# Patient Record
Sex: Female | Born: 1935
Health system: Southern US, Community
[De-identification: ages and names within clinical notes are randomized; demographics above are authoritative.]

## PROBLEM LIST (undated history)

## (undated) DIAGNOSIS — R0600 Dyspnea, unspecified: Secondary | ICD-10-CM

## (undated) DIAGNOSIS — Z9889 Other specified postprocedural states: Secondary | ICD-10-CM

## (undated) DIAGNOSIS — C9110 Chronic lymphocytic leukemia of B-cell type not having achieved remission: Secondary | ICD-10-CM

## (undated) DIAGNOSIS — R112 Nausea with vomiting, unspecified: Secondary | ICD-10-CM

## (undated) DIAGNOSIS — K219 Gastro-esophageal reflux disease without esophagitis: Secondary | ICD-10-CM

## (undated) DIAGNOSIS — M8580 Other specified disorders of bone density and structure, unspecified site: Secondary | ICD-10-CM

## (undated) DIAGNOSIS — T4145XA Adverse effect of unspecified anesthetic, initial encounter: Secondary | ICD-10-CM

## (undated) DIAGNOSIS — K5790 Diverticulosis of intestine, part unspecified, without perforation or abscess without bleeding: Secondary | ICD-10-CM

## (undated) DIAGNOSIS — F329 Major depressive disorder, single episode, unspecified: Secondary | ICD-10-CM

## (undated) DIAGNOSIS — T8859XA Other complications of anesthesia, initial encounter: Secondary | ICD-10-CM

## (undated) DIAGNOSIS — F419 Anxiety disorder, unspecified: Secondary | ICD-10-CM

## (undated) DIAGNOSIS — F32A Depression, unspecified: Secondary | ICD-10-CM

## (undated) DIAGNOSIS — R413 Other amnesia: Secondary | ICD-10-CM

## (undated) DIAGNOSIS — G3184 Mild cognitive impairment, so stated: Secondary | ICD-10-CM

## (undated) DIAGNOSIS — IMO0001 Reserved for inherently not codable concepts without codable children: Secondary | ICD-10-CM

## (undated) DIAGNOSIS — R0602 Shortness of breath: Secondary | ICD-10-CM

## (undated) DIAGNOSIS — H9190 Unspecified hearing loss, unspecified ear: Secondary | ICD-10-CM

## (undated) DIAGNOSIS — F418 Other specified anxiety disorders: Secondary | ICD-10-CM

## (undated) DIAGNOSIS — M1990 Unspecified osteoarthritis, unspecified site: Secondary | ICD-10-CM

## (undated) HISTORY — DX: Chronic lymphocytic leukemia of B-cell type not having achieved remission: C91.10

## (undated) HISTORY — DX: Major depressive disorder, single episode, unspecified: F32.9

## (undated) HISTORY — DX: Diverticulosis of intestine, part unspecified, without perforation or abscess without bleeding: K57.90

## (undated) HISTORY — PX: APPENDECTOMY: SHX54

## (undated) HISTORY — DX: Gastro-esophageal reflux disease without esophagitis: K21.9

## (undated) HISTORY — DX: Depression, unspecified: F32.A

## (undated) HISTORY — DX: Anxiety disorder, unspecified: F41.9

## (undated) HISTORY — PX: ABDOMINAL HYSTERECTOMY: SHX81

## (undated) HISTORY — DX: Unspecified hearing loss, unspecified ear: H91.90

## (undated) HISTORY — PX: TONSILLECTOMY: SUR1361

## (undated) HISTORY — DX: Other specified anxiety disorders: F41.8

## (undated) HISTORY — DX: Other amnesia: R41.3

## (undated) HISTORY — DX: Other specified disorders of bone density and structure, unspecified site: M85.80

## (undated) HISTORY — PX: BREAST EXCISIONAL BIOPSY: SUR124

## (undated) HISTORY — DX: Mild cognitive impairment of uncertain or unknown etiology: G31.84

## (undated) HISTORY — DX: Shortness of breath: R06.02

## (undated) HISTORY — PX: CATARACT EXTRACTION: SUR2

## (undated) HISTORY — DX: Reserved for inherently not codable concepts without codable children: IMO0001

## (undated) HISTORY — PX: EYE SURGERY: SHX253

---

## 1998-04-16 ENCOUNTER — Ambulatory Visit (HOSPITAL_COMMUNITY): Admission: RE | Admit: 1998-04-16 | Discharge: 1998-04-16 | Payer: Self-pay | Admitting: Gastroenterology

## 2003-05-10 ENCOUNTER — Encounter: Admission: RE | Admit: 2003-05-10 | Discharge: 2003-05-10 | Payer: Self-pay | Admitting: Oncology

## 2003-05-10 ENCOUNTER — Encounter: Payer: Self-pay | Admitting: Oncology

## 2003-05-24 ENCOUNTER — Ambulatory Visit (HOSPITAL_COMMUNITY): Admission: RE | Admit: 2003-05-24 | Discharge: 2003-05-24 | Payer: Self-pay | Admitting: Gastroenterology

## 2003-08-06 ENCOUNTER — Ambulatory Visit (HOSPITAL_BASED_OUTPATIENT_CLINIC_OR_DEPARTMENT_OTHER): Admission: RE | Admit: 2003-08-06 | Discharge: 2003-08-06 | Payer: Self-pay | Admitting: Urology

## 2003-08-06 ENCOUNTER — Ambulatory Visit (HOSPITAL_COMMUNITY): Admission: RE | Admit: 2003-08-06 | Discharge: 2003-08-06 | Payer: Self-pay | Admitting: Urology

## 2004-05-12 ENCOUNTER — Encounter: Admission: RE | Admit: 2004-05-12 | Discharge: 2004-05-12 | Payer: Self-pay | Admitting: Internal Medicine

## 2005-02-03 HISTORY — PX: OTHER SURGICAL HISTORY: SHX169

## 2005-05-18 ENCOUNTER — Ambulatory Visit (HOSPITAL_COMMUNITY): Admission: RE | Admit: 2005-05-18 | Discharge: 2005-05-18 | Payer: Self-pay | Admitting: Internal Medicine

## 2006-06-01 ENCOUNTER — Ambulatory Visit (HOSPITAL_COMMUNITY): Admission: RE | Admit: 2006-06-01 | Discharge: 2006-06-01 | Payer: Self-pay | Admitting: Internal Medicine

## 2007-06-06 ENCOUNTER — Ambulatory Visit (HOSPITAL_COMMUNITY): Admission: RE | Admit: 2007-06-06 | Discharge: 2007-06-06 | Payer: Self-pay | Admitting: Internal Medicine

## 2007-06-09 ENCOUNTER — Encounter: Admission: RE | Admit: 2007-06-09 | Discharge: 2007-06-09 | Payer: Self-pay | Admitting: Internal Medicine

## 2008-06-11 ENCOUNTER — Ambulatory Visit (HOSPITAL_COMMUNITY): Admission: RE | Admit: 2008-06-11 | Discharge: 2008-06-11 | Payer: Self-pay | Admitting: Internal Medicine

## 2009-06-21 ENCOUNTER — Ambulatory Visit (HOSPITAL_COMMUNITY): Admission: RE | Admit: 2009-06-21 | Discharge: 2009-06-21 | Payer: Self-pay | Admitting: Internal Medicine

## 2010-02-03 ENCOUNTER — Ambulatory Visit: Payer: Self-pay | Admitting: Oncology

## 2010-02-04 LAB — COMPREHENSIVE METABOLIC PANEL
Albumin: 4.7 g/dL (ref 3.5–5.2)
Alkaline Phosphatase: 53 U/L (ref 39–117)
CO2: 30 mEq/L (ref 19–32)
Glucose, Bld: 108 mg/dL — ABNORMAL HIGH (ref 70–99)
Potassium: 4 mEq/L (ref 3.5–5.3)
Sodium: 142 mEq/L (ref 135–145)
Total Protein: 6.6 g/dL (ref 6.0–8.3)

## 2010-02-04 LAB — CBC WITH DIFFERENTIAL/PLATELET
BASO%: 0.2 % (ref 0.0–2.0)
LYMPH%: 58.1 % — ABNORMAL HIGH (ref 14.0–49.7)
MCHC: 34.7 g/dL (ref 31.5–36.0)
MCV: 93.2 fL (ref 79.5–101.0)
MONO%: 4.2 % (ref 0.0–14.0)
Platelets: 272 10*3/uL (ref 145–400)
RBC: 4.59 10*6/uL (ref 3.70–5.45)

## 2010-02-04 LAB — MORPHOLOGY: RBC Comments: NORMAL

## 2010-06-23 ENCOUNTER — Ambulatory Visit (HOSPITAL_COMMUNITY): Admission: RE | Admit: 2010-06-23 | Discharge: 2010-06-23 | Payer: Self-pay | Admitting: Internal Medicine

## 2010-09-21 ENCOUNTER — Encounter: Payer: Self-pay | Admitting: Internal Medicine

## 2010-10-31 HISTORY — PX: TRANSTHORACIC ECHOCARDIOGRAM: SHX275

## 2011-01-16 NOTE — Op Note (Signed)
NAME:  Brenda [name], Brenda [name]                          ACCOUNT NO.:  [id number]   MEDICAL RECORD NO.:  [id number]                   PATIENT TYPE:  AMB   LOCATION:  [facility]                                 FACILITY:  [facility]   PHYSICIAN:  Sigmund I. Patsi Sears, M.D.         DATE OF BIRTH:  1932/06/24   DATE OF PROCEDURE:  08/06/2003  DATE OF DISCHARGE:                                 OPERATIVE REPORT   PREOPERATIVE DIAGNOSIS:  Urinary incontinence, type 3.   POSTOPERATIVE DIAGNOSIS:  Urinary incontinence, type 3.   OPERATION:  Insertion of pubovaginal sling, Mentor transobturator tape.   SURGEON:  Sigmund I. Patsi Sears, M.D.   ANESTHESIA:  General LMA.   PREPARATION:  After appropriate preanesthesia the patient is brought to the  operating room and placed on the operating table in the dorsal supine  position, where general LMA anesthesia was introduced.  She was then  replaced in a dorsal lithotomy position and the pubis was prepped with  Betadine solution and draped in the usual fashion.   DESCRIPTION OF PROCEDURE:  Inspection revealed that the patient has  significant vaginal atrophy.  She was status post previous vaginal surgery,  and there was no evidence of cystourethrocele, enterocele, or rectocele.   Marcaine 0.25% plain 6 mL are injected into the periurethral fascia, and a 2  cm incision is made over the urethra.  Subcutaneous tissue was dissected  with the use of sharp and blunt dissection.  A measurement is made 5 cm  lateral to the urethra at the level of the clitoris, and bilateral stab  wounds are made.  Using the Mentor transobturator placement device, the  transobturator tape was placed underneath the obturator bone bilaterally  without difficulty.  Cystoscopy reveals no evidence of bladder disruption.  Visual inspection reveals no evidence of vaginal tissue disruption.   After copious antibiotic irrigation, the wound is closed in two layers with  3-0 Vicryl suture.  The  patient tolerated the procedure well, was awakened  after given 15 mg of IV Toradol, taken to the recovery room in good  condition.                                               Sigmund I. Patsi Sears, M.D.    SIT/MEDQ  D:  08/06/2003  T:  08/06/2003  Job:  [id number]

## 2011-01-16 NOTE — Op Note (Signed)
   NAME:  Brenda [name], Brenda [name]                          ACCOUNT NO.:  [id number]   MEDICAL RECORD NO.:  [id number]                   PATIENT TYPE:  AMB   LOCATION:  ENDO                                 FACILITY:  [facility] [place]   PHYSICIAN:  Petra Kuba, M.D.                 DATE OF BIRTH:  [birth year]   DATE OF PROCEDURE:  05/24/2003  DATE OF DISCHARGE:                                 OPERATIVE REPORT   PROCEDURE:  Colonoscopy.   INDICATIONS FOR PROCEDURE:  Family history of colon cancer, due for colonic  screening. Consent was signed after risks, benefits, methods, and options  were thoroughly discussed in the past.   MEDICINES USED:  Fentanyl 75 mcg, Versed 6.   DESCRIPTION OF PROCEDURE:  Rectal inspection was pertinent for small  external hemorrhoids. Digital exam was negative. The pediatric video  adjustable colonoscope was inserted and with abdominal pressure easily  advanced around the colon to the cecum. No obvious abnormality was seen on  insertion. The cecum was identified by the appendiceal orifice and the  ileocecal valve. The prep was fairly adequate, did require a moderate amount  of washing and suctioning for adequate visualization. On slow withdrawal  through the colon, no abnormalities were seen specifically no polyps,  tumors, masses or diverticula. Anal rectal pull through and retroflexion did  confirm some hemorrhoids. The scope was reinserted a short ways up the left  side of the colon, air was suctioned, scope removed. The patient tolerated  the procedure well. There was no obvious or immediate complications.   ENDOSCOPIC DIAGNOSIS:  1. Internal and external hemorrhoids.  2. Otherwise within normal limits to the cecum.    PLAN:  Yearly rectals and guaiacs per Dr. Waynard Edwards or Magrinat. Happy to see  back p.r.n., otherwise, if doing well medically repeat screening in five  years.                                               Petra Kuba, M.D.    MEM/MEDQ  D:   05/24/2003  T:  05/24/2003  Job:  [id number]   cc:   Valentino Hue. Magrinat, M.D.  [address] [facility]  [place]  [place] [address]  Fax: [phone]   Loraine Leriche A. Waynard Edwards, M.D.  [address]  [place]  [place] [address]  Fax: [phone]

## 2011-01-23 HISTORY — PX: NM GATED MYOCARDIAL STUDY (ARMX HX): HXRAD1849

## 2011-02-02 ENCOUNTER — Other Ambulatory Visit: Payer: Self-pay | Admitting: Internal Medicine

## 2011-02-03 ENCOUNTER — Ambulatory Visit
Admission: RE | Admit: 2011-02-03 | Discharge: 2011-02-03 | Disposition: A | Discharge status: Home / Self Care | Payer: Medicare Other | Source: Ambulatory Visit | Attending: Internal Medicine | Admitting: Internal Medicine

## 2011-02-06 ENCOUNTER — Other Ambulatory Visit: Payer: Self-pay | Admitting: Gastroenterology

## 2011-02-11 ENCOUNTER — Ambulatory Visit
Admission: RE | Admit: 2011-02-11 | Discharge: 2011-02-11 | Disposition: A | Discharge status: Home / Self Care | Payer: Medicare Other | Source: Ambulatory Visit | Attending: Gastroenterology | Admitting: Gastroenterology

## 2011-02-11 MED ORDER — IOHEXOL 300 MG/ML  SOLN
100.0000 mL | Freq: Once | INTRAMUSCULAR | Status: AC | PRN
  Administered 2011-02-11: 100 mL via INTRAVENOUS

## 2011-02-20 ENCOUNTER — Other Ambulatory Visit: Payer: Self-pay | Admitting: Neurology

## 2011-02-20 DIAGNOSIS — R413 Other amnesia: Secondary | ICD-10-CM

## 2011-02-24 ENCOUNTER — Ambulatory Visit
Admission: RE | Admit: 2011-02-24 | Discharge: 2011-02-24 | Disposition: A | Discharge status: Home / Self Care | Payer: Medicare Other | Source: Ambulatory Visit | Attending: Neurology | Admitting: Neurology

## 2011-02-24 DIAGNOSIS — R413 Other amnesia: Secondary | ICD-10-CM

## 2011-06-02 ENCOUNTER — Other Ambulatory Visit (HOSPITAL_COMMUNITY): Payer: Self-pay | Admitting: Internal Medicine

## 2011-06-02 DIAGNOSIS — Z1231 Encounter for screening mammogram for malignant neoplasm of breast: Secondary | ICD-10-CM

## 2011-06-26 ENCOUNTER — Ambulatory Visit (HOSPITAL_COMMUNITY)
Admission: RE | Admit: 2011-06-26 | Discharge: 2011-06-26 | Disposition: A | Discharge status: Home / Self Care | Payer: Medicare Other | Source: Ambulatory Visit | Attending: Internal Medicine | Admitting: Internal Medicine

## 2011-06-26 DIAGNOSIS — Z1231 Encounter for screening mammogram for malignant neoplasm of breast: Secondary | ICD-10-CM | POA: Insufficient documentation

## 2011-10-07 DIAGNOSIS — R413 Other amnesia: Secondary | ICD-10-CM | POA: Diagnosis not present

## 2011-10-29 DIAGNOSIS — R0602 Shortness of breath: Secondary | ICD-10-CM | POA: Diagnosis not present

## 2011-12-16 DIAGNOSIS — W458XXA Other foreign body or object entering through skin, initial encounter: Secondary | ICD-10-CM | POA: Diagnosis not present

## 2012-02-01 DIAGNOSIS — L578 Other skin changes due to chronic exposure to nonionizing radiation: Secondary | ICD-10-CM | POA: Diagnosis not present

## 2012-02-01 DIAGNOSIS — Z85828 Personal history of other malignant neoplasm of skin: Secondary | ICD-10-CM | POA: Diagnosis not present

## 2012-02-01 DIAGNOSIS — R413 Other amnesia: Secondary | ICD-10-CM | POA: Diagnosis not present

## 2012-02-01 DIAGNOSIS — L821 Other seborrheic keratosis: Secondary | ICD-10-CM | POA: Diagnosis not present

## 2012-02-01 DIAGNOSIS — B350 Tinea barbae and tinea capitis: Secondary | ICD-10-CM | POA: Diagnosis not present

## 2012-02-18 DIAGNOSIS — Z961 Presence of intraocular lens: Secondary | ICD-10-CM | POA: Diagnosis not present

## 2012-02-18 DIAGNOSIS — H52209 Unspecified astigmatism, unspecified eye: Secondary | ICD-10-CM | POA: Diagnosis not present

## 2012-02-18 DIAGNOSIS — H43819 Vitreous degeneration, unspecified eye: Secondary | ICD-10-CM | POA: Diagnosis not present

## 2012-02-18 DIAGNOSIS — H01009 Unspecified blepharitis unspecified eye, unspecified eyelid: Secondary | ICD-10-CM | POA: Diagnosis not present

## 2012-03-02 DIAGNOSIS — R413 Other amnesia: Secondary | ICD-10-CM | POA: Diagnosis not present

## 2012-04-14 DIAGNOSIS — M949 Disorder of cartilage, unspecified: Secondary | ICD-10-CM | POA: Diagnosis not present

## 2012-04-14 DIAGNOSIS — E785 Hyperlipidemia, unspecified: Secondary | ICD-10-CM | POA: Diagnosis not present

## 2012-04-14 DIAGNOSIS — F329 Major depressive disorder, single episode, unspecified: Secondary | ICD-10-CM | POA: Diagnosis not present

## 2012-04-14 DIAGNOSIS — M899 Disorder of bone, unspecified: Secondary | ICD-10-CM | POA: Diagnosis not present

## 2012-04-14 DIAGNOSIS — R7301 Impaired fasting glucose: Secondary | ICD-10-CM | POA: Diagnosis not present

## 2012-04-21 DIAGNOSIS — R7301 Impaired fasting glucose: Secondary | ICD-10-CM | POA: Diagnosis not present

## 2012-04-21 DIAGNOSIS — Z0000 Encounter for general adult medical examination without abnormal findings: Secondary | ICD-10-CM | POA: Diagnosis not present

## 2012-04-21 DIAGNOSIS — Z1212 Encounter for screening for malignant neoplasm of rectum: Secondary | ICD-10-CM | POA: Diagnosis not present

## 2012-04-21 DIAGNOSIS — F329 Major depressive disorder, single episode, unspecified: Secondary | ICD-10-CM | POA: Diagnosis not present

## 2012-04-21 DIAGNOSIS — C9110 Chronic lymphocytic leukemia of B-cell type not having achieved remission: Secondary | ICD-10-CM | POA: Diagnosis not present

## 2012-04-21 DIAGNOSIS — Z124 Encounter for screening for malignant neoplasm of cervix: Secondary | ICD-10-CM | POA: Diagnosis not present

## 2012-05-09 ENCOUNTER — Other Ambulatory Visit: Payer: Self-pay | Admitting: Internal Medicine

## 2012-05-09 DIAGNOSIS — N644 Mastodynia: Secondary | ICD-10-CM

## 2012-05-10 DIAGNOSIS — M899 Disorder of bone, unspecified: Secondary | ICD-10-CM | POA: Diagnosis not present

## 2012-05-11 ENCOUNTER — Ambulatory Visit
Admission: RE | Admit: 2012-05-11 | Discharge: 2012-05-11 | Disposition: A | Discharge status: Home / Self Care | Payer: Medicare Other | Source: Ambulatory Visit | Attending: Internal Medicine | Admitting: Internal Medicine

## 2012-05-11 DIAGNOSIS — N644 Mastodynia: Secondary | ICD-10-CM | POA: Diagnosis not present

## 2012-05-12 DIAGNOSIS — Z23 Encounter for immunization: Secondary | ICD-10-CM | POA: Diagnosis not present

## 2012-05-25 DIAGNOSIS — Z23 Encounter for immunization: Secondary | ICD-10-CM | POA: Diagnosis not present

## 2012-07-14 ENCOUNTER — Other Ambulatory Visit (HOSPITAL_COMMUNITY): Payer: Self-pay | Admitting: Internal Medicine

## 2012-07-14 DIAGNOSIS — Z1231 Encounter for screening mammogram for malignant neoplasm of breast: Secondary | ICD-10-CM

## 2012-08-04 ENCOUNTER — Ambulatory Visit (HOSPITAL_COMMUNITY)
Admission: RE | Admit: 2012-08-04 | Discharge: 2012-08-04 | Disposition: A | Discharge status: Home / Self Care | Payer: Medicare Other | Source: Ambulatory Visit | Attending: Internal Medicine | Admitting: Internal Medicine

## 2012-08-04 DIAGNOSIS — Z1231 Encounter for screening mammogram for malignant neoplasm of breast: Secondary | ICD-10-CM | POA: Insufficient documentation

## 2012-08-16 DIAGNOSIS — E785 Hyperlipidemia, unspecified: Secondary | ICD-10-CM | POA: Diagnosis not present

## 2012-08-16 DIAGNOSIS — R413 Other amnesia: Secondary | ICD-10-CM | POA: Diagnosis not present

## 2012-08-17 DIAGNOSIS — E785 Hyperlipidemia, unspecified: Secondary | ICD-10-CM | POA: Diagnosis not present

## 2012-08-19 DIAGNOSIS — N952 Postmenopausal atrophic vaginitis: Secondary | ICD-10-CM | POA: Diagnosis not present

## 2012-08-19 DIAGNOSIS — N362 Urethral caruncle: Secondary | ICD-10-CM | POA: Diagnosis not present

## 2012-10-04 DIAGNOSIS — R413 Other amnesia: Secondary | ICD-10-CM | POA: Diagnosis not present

## 2013-01-20 ENCOUNTER — Telehealth: Payer: Self-pay | Admitting: Nurse Practitioner

## 2013-02-01 DIAGNOSIS — D485 Neoplasm of uncertain behavior of skin: Secondary | ICD-10-CM | POA: Diagnosis not present

## 2013-02-01 DIAGNOSIS — L821 Other seborrheic keratosis: Secondary | ICD-10-CM | POA: Diagnosis not present

## 2013-02-01 DIAGNOSIS — Z85828 Personal history of other malignant neoplasm of skin: Secondary | ICD-10-CM | POA: Diagnosis not present

## 2013-02-01 DIAGNOSIS — D0460 Carcinoma in situ of skin of unspecified upper limb, including shoulder: Secondary | ICD-10-CM | POA: Diagnosis not present

## 2013-02-01 DIAGNOSIS — L570 Actinic keratosis: Secondary | ICD-10-CM | POA: Diagnosis not present

## 2013-02-01 DIAGNOSIS — D1739 Benign lipomatous neoplasm of skin and subcutaneous tissue of other sites: Secondary | ICD-10-CM | POA: Diagnosis not present

## 2013-02-02 DIAGNOSIS — L570 Actinic keratosis: Secondary | ICD-10-CM | POA: Diagnosis not present

## 2013-02-14 DIAGNOSIS — E785 Hyperlipidemia, unspecified: Secondary | ICD-10-CM | POA: Diagnosis not present

## 2013-02-20 ENCOUNTER — Other Ambulatory Visit: Payer: Self-pay

## 2013-02-20 DIAGNOSIS — H2640 Unspecified secondary cataract: Secondary | ICD-10-CM | POA: Diagnosis not present

## 2013-02-20 DIAGNOSIS — Z961 Presence of intraocular lens: Secondary | ICD-10-CM | POA: Diagnosis not present

## 2013-02-20 DIAGNOSIS — H524 Presbyopia: Secondary | ICD-10-CM | POA: Diagnosis not present

## 2013-02-20 DIAGNOSIS — H01009 Unspecified blepharitis unspecified eye, unspecified eyelid: Secondary | ICD-10-CM | POA: Diagnosis not present

## 2013-02-20 MED ORDER — CITALOPRAM HYDROBROMIDE 10 MG PO TABS: 10.0000 mg | ORAL_TABLET | Freq: Every day | ORAL | Status: DC

## 2013-05-29 DIAGNOSIS — Z79899 Other long term (current) drug therapy: Secondary | ICD-10-CM | POA: Diagnosis not present

## 2013-05-29 DIAGNOSIS — E785 Hyperlipidemia, unspecified: Secondary | ICD-10-CM | POA: Diagnosis not present

## 2013-05-29 DIAGNOSIS — R7301 Impaired fasting glucose: Secondary | ICD-10-CM | POA: Diagnosis not present

## 2013-05-29 DIAGNOSIS — M899 Disorder of bone, unspecified: Secondary | ICD-10-CM | POA: Diagnosis not present

## 2013-05-30 ENCOUNTER — Encounter: Payer: Self-pay | Admitting: Nurse Practitioner

## 2013-06-02 ENCOUNTER — Encounter: Payer: Self-pay | Admitting: Nurse Practitioner

## 2013-06-02 ENCOUNTER — Ambulatory Visit (INDEPENDENT_AMBULATORY_CARE_PROVIDER_SITE_OTHER): Payer: Medicare Other | Admitting: Nurse Practitioner

## 2013-06-02 VITALS — BP 117/61 | HR 76 | Ht 66.0 in | Wt 163.0 lb

## 2013-06-02 DIAGNOSIS — R413 Other amnesia: Secondary | ICD-10-CM

## 2013-06-02 NOTE — Progress Notes (Signed)
[facility]  PATIENT: Brenda [name] DOB: [birth year]   REASON FOR VISIT: Followup for mild cognitive impairment   HISTORY OF PRESENT ILLNESS:Brenda [name], 79 yr old  right-handed Caucasian female,alone at today's visit returns for followup. She was last seen 10/04/2012. She continues to have a lot of anxiety and stress, currently redoing part of her house which has been time-consuming and sometimes she is unable to make quick decisions. She remains on Celexa but she also worries about other drugs that are ordered for her that the side effects are memory loss. She continues to walk daily and goes to an exercise class III times a week. She is active socially and does a lot of reading.    HISTORY: She is referred by her primary care physician for evaluation of short-term memory trouble. She was initially evaluated by Dr. Terrace [name] 02/12/11. It has been noticeable over the past 4 years, most of by herself, not by her family members. She noticed she has to concentrate to remember details, she tends to forget people's names difficult to come up with words sometimes, but she noticed similar problem in her husband and in her friends.  She tends to read a lot, history books, she has no difficulty keeping up with her reading, she is also socially active, she denied trouble handling her finances, or driving. 08/06/11- Normal MRI of the brain and normal dementia labs. Her father had Alzheimers disease. She has a hx of depression. She is independent with ADL's and driving. She lives at [facility] some of the time. No other changes.   10/04/12: Patient returns for followup. Neuropsych testing by Dr. Leonides [name] after last visit with Dr. Terrace [name] 02/01/12. His dx was probable mild cognitive impairment -amnestic type as a cause of her memory retrieval. He recommends repeat testing in July 2014. She restarted low dose Celexa in June 2013 after complaints of anxiety and mild depression. She says her memory is the same.  She continues to drive without difficulty. She is independent in all ADL's.   REVIEW OF SYSTEMS: Full 14 system review of systems performed and notable only for:  Constitutional: N/A  Cardiovascular: N/A  Ear/Nose/Throat: Hearing loss Skin: N/A  Eyes: N/A  Respiratory: N/A  Gastroitestinal: N/A  Hematology/Lymphatic: N/A  Endocrine: N/A Musculoskeletal:N/A  Allergy/Immunology: N/A  Neurological: Memory loss  Psychiatric anxiety   ALLERGIES: Allergies  Allergen Reactions  . Adhesive [Tape]   . Codeine   . Sulfa Antibiotics     HOME MEDICATIONS: Outpatient Prescriptions Prior to Visit  Medication Sig Dispense Refill  . atorvastatin (LIPITOR) 10 MG tablet Take 10 mg by mouth daily.      . Calcium Carbonate-Vitamin D (CALCIUM + D PO) Take 10 mg by mouth daily.      . citalopram (CELEXA) 10 MG tablet Take 1 tablet (10 mg total) by mouth daily.  90 tablet  2  . LORazepam (ATIVAN) 0.5 MG tablet Take 0.5 mg by mouth every 8 (eight) hours.      . Multiple Vitamin (MULTIVITAMIN) capsule Take 1 capsule by mouth daily.      Marland Kitchen zolpidem (AMBIEN) 5 MG tablet Take 5 mg by mouth daily.      . Estradiol Acetate 0.1 MG/24HR RING Place vaginally as directed.       No facility-administered medications prior to visit.    PAST MEDICAL HISTORY: Past Medical History  Diagnosis Date  . Mild cognitive impairment   . CLL (chronic lymphocytic leukemia)   . Reflux   .  Depression   . Anxiety   . Memory loss     PAST SURGICAL HISTORY: Past Surgical History  Procedure Laterality Date  . Appendectomy      FAMILY HISTORY: Family History  Problem Relation Age of Onset  . Alzheimer's disease Father     SOCIAL HISTORY: History   Social History  . Marital Status: Married    Spouse Name: Greggory Stallion    Number of Children: 2  . Years of Education: college   Occupational History  . Not on file.   Social History Main Topics  . Smoking status: Never Smoker   . Smokeless tobacco: Never  Used  . Alcohol Use: Yes     Comment: wine daily   . Drug Use: No  . Sexual Activity: Not on file   Other Topics Concern  . Not on file   Social History Narrative   Patient lives at home with husband Greggory Stallion.    Patient is retired.    Patient has a college education.    Patient has 2 children.    Patient drinks 2 cups of coffee daily.      PHYSICAL EXAM  Filed Vitals:   06/02/13 0857  BP: 117/61  Pulse: 76  Height: 5\' 6"  (1.676 m)  Weight: 163 lb (73.936 kg)   Body mass index is 26.32 kg/(m^2).  Generalized: Well developed, in no acute distress  Head: normocephalic and atraumatic,. Oropharynx benign  Neck: Supple, no carotid bruits  Cardiac: Regular rate rhythm, no murmur  Musculoskeletal: No deformity   Neurological examination   Mentation: Alert oriented to time, place, history taking. MMSE 30/30. AFT 16. Follows all commands speech and language fluent  Cranial nerve II-XII: Pupils were equal round reactive to light extraocular movements were full, visual field were full on confrontational test. Facial sensation and strength were normal. hearing was intact to finger rubbing bilaterally. Uvula tongue midline. head turning and shoulder shrug and were normal and symmetric.Tongue protrusion into cheek strength was normal. Motor: normal bulk and tone, full strength in the BUE, BLE, fine finger movements normal, no pronator drift. No focal weakness Sensory: normal and symmetric to light touch, pinprick, and  vibration  Coordination: finger-nose-finger, heel-to-shin bilaterally, no dysmetria Reflexes: Brachioradialis 2/2, biceps 2/2, triceps 2/2, patellar 2/2, Achilles 2/2, plantar responses were flexor bilaterally. Gait and Station: Rising up from seated position without assistance, normal stance, without trunk ataxia, moderate stride, good arm swing, smooth turning, able to perform tiptoe, and heel walking without difficulty. Tandem gait normal  DIAGNOSTIC DATA (LABS,  IMAGING, TESTING) None to review   ASSESSMENT AND PLAN  79 y.o. year old female  has a past medical history of Mild cognitive impairment; CLL (chronic lymphocytic leukemia); Reflux; Depression; Anxiety; and Memory loss. here for followup.   Mental status exam 30 out of 30, animal fluency is 16.  Continue moderate exercise for overall general health Patient is not interested in another neuropsych evaluation as suggested by Dr. Leonides [name].  She claims she will talk to her PCP about getting off some meds that have memory loss and cognitive issues as side effects.  Followup in 6-8 months  Nilda Riggs, [facility], [facility], APRN  [facility] [address], [address] [place], [place] [address] [phone]

## 2013-06-02 NOTE — Patient Instructions (Addendum)
Mental status exam 30 out of 30, animal fluency is 16 Continue moderate exercise for overall general health Followup in 6-8 months

## 2013-06-05 DIAGNOSIS — R0602 Shortness of breath: Secondary | ICD-10-CM | POA: Diagnosis not present

## 2013-06-05 DIAGNOSIS — C9110 Chronic lymphocytic leukemia of B-cell type not having achieved remission: Secondary | ICD-10-CM | POA: Diagnosis not present

## 2013-06-05 DIAGNOSIS — M899 Disorder of bone, unspecified: Secondary | ICD-10-CM | POA: Diagnosis not present

## 2013-06-05 DIAGNOSIS — Z124 Encounter for screening for malignant neoplasm of cervix: Secondary | ICD-10-CM | POA: Diagnosis not present

## 2013-06-05 DIAGNOSIS — M25569 Pain in unspecified knee: Secondary | ICD-10-CM | POA: Diagnosis not present

## 2013-06-05 DIAGNOSIS — Z1331 Encounter for screening for depression: Secondary | ICD-10-CM | POA: Diagnosis not present

## 2013-06-05 DIAGNOSIS — Z0000 Encounter for general adult medical examination without abnormal findings: Secondary | ICD-10-CM | POA: Diagnosis not present

## 2013-06-05 DIAGNOSIS — R7301 Impaired fasting glucose: Secondary | ICD-10-CM | POA: Diagnosis not present

## 2013-06-05 DIAGNOSIS — E785 Hyperlipidemia, unspecified: Secondary | ICD-10-CM | POA: Diagnosis not present

## 2013-06-05 DIAGNOSIS — Z1212 Encounter for screening for malignant neoplasm of rectum: Secondary | ICD-10-CM | POA: Diagnosis not present

## 2013-06-05 DIAGNOSIS — R32 Unspecified urinary incontinence: Secondary | ICD-10-CM | POA: Diagnosis not present

## 2013-06-05 DIAGNOSIS — Z23 Encounter for immunization: Secondary | ICD-10-CM | POA: Diagnosis not present

## 2013-06-06 ENCOUNTER — Telehealth: Payer: Self-pay | Admitting: Nurse Practitioner

## 2013-06-07 NOTE — Telephone Encounter (Signed)
Spoke to patient and she said she will call tomorrow if she still feels the need to speak to [place].

## 2013-06-21 DIAGNOSIS — L570 Actinic keratosis: Secondary | ICD-10-CM | POA: Diagnosis not present

## 2013-06-21 DIAGNOSIS — Z23 Encounter for immunization: Secondary | ICD-10-CM | POA: Diagnosis not present

## 2013-07-18 ENCOUNTER — Other Ambulatory Visit (HOSPITAL_COMMUNITY): Payer: Self-pay | Admitting: Internal Medicine

## 2013-07-18 DIAGNOSIS — Z1231 Encounter for screening mammogram for malignant neoplasm of breast: Secondary | ICD-10-CM

## 2013-08-02 DIAGNOSIS — D485 Neoplasm of uncertain behavior of skin: Secondary | ICD-10-CM | POA: Diagnosis not present

## 2013-08-02 DIAGNOSIS — L821 Other seborrheic keratosis: Secondary | ICD-10-CM | POA: Diagnosis not present

## 2013-08-02 DIAGNOSIS — Z85828 Personal history of other malignant neoplasm of skin: Secondary | ICD-10-CM | POA: Diagnosis not present

## 2013-08-02 DIAGNOSIS — L570 Actinic keratosis: Secondary | ICD-10-CM | POA: Diagnosis not present

## 2013-08-08 ENCOUNTER — Ambulatory Visit (HOSPITAL_COMMUNITY)
Admission: RE | Admit: 2013-08-08 | Discharge: 2013-08-08 | Disposition: A | Discharge status: Home / Self Care | Payer: Medicare Other | Source: Ambulatory Visit | Attending: Internal Medicine | Admitting: Internal Medicine

## 2013-08-08 DIAGNOSIS — Z1231 Encounter for screening mammogram for malignant neoplasm of breast: Secondary | ICD-10-CM | POA: Diagnosis not present

## 2013-08-15 DIAGNOSIS — N952 Postmenopausal atrophic vaginitis: Secondary | ICD-10-CM | POA: Diagnosis not present

## 2013-08-15 DIAGNOSIS — R32 Unspecified urinary incontinence: Secondary | ICD-10-CM | POA: Diagnosis not present

## 2013-08-19 ENCOUNTER — Ambulatory Visit (INDEPENDENT_AMBULATORY_CARE_PROVIDER_SITE_OTHER): Payer: Medicare Other | Admitting: Internal Medicine

## 2013-08-19 VITALS — BP 120/62 | HR 85 | Temp 99.2°F | Resp 16 | Ht 66.0 in | Wt 166.0 lb

## 2013-08-19 DIAGNOSIS — R05 Cough: Secondary | ICD-10-CM

## 2013-08-19 DIAGNOSIS — R509 Fever, unspecified: Secondary | ICD-10-CM

## 2013-08-19 LAB — POCT INFLUENZA A/B
Influenza A, POC: NEGATIVE
Influenza B, POC: NEGATIVE

## 2013-08-19 LAB — POCT CBC
Hemoglobin: 13.7 g/dL (ref 12.2–16.2)
Lymph, poc: 4.5 — AB (ref 0.6–3.4)
MCH, POC: 31 pg (ref 27–31.2)
MCHC: 31.2 g/dL — AB (ref 31.8–35.4)
MID (cbc): 0.7 (ref 0–0.9)
MPV: 8.3 fL (ref 0–99.8)
POC Granulocyte: 6.5 (ref 2–6.9)
POC MID %: 6.1 %M (ref 0–12)
Platelet Count, POC: 225 10*3/uL (ref 142–424)
RDW, POC: 14.5 %
WBC: 11.7 10*3/uL — AB (ref 4.6–10.2)

## 2013-08-19 NOTE — Patient Instructions (Signed)
Acute Bronchitis Bronchitis is inflammation of the airways that extend from the windpipe into the lungs (bronchi). The inflammation often causes mucus to develop. This leads to a cough, which is the most common symptom of bronchitis.  In acute bronchitis, the condition usually develops suddenly and goes away over time, usually in a couple weeks. Smoking, allergies, and asthma can make bronchitis worse. Repeated episodes of bronchitis may cause further lung problems.  CAUSES Acute bronchitis is most often caused by the same virus that causes a cold. The virus can spread from person to person (contagious).  SIGNS AND SYMPTOMS   Cough.   Fever.   Coughing up mucus.   Body aches.   Chest congestion.   Chills.   Shortness of breath.   Sore throat.  DIAGNOSIS  Acute bronchitis is usually diagnosed through a physical exam. Tests, such as chest X-rays, are sometimes done to rule out other conditions.  TREATMENT  Acute bronchitis usually goes away in a couple weeks. Often times, no medical treatment is necessary. Medicines are sometimes given for relief of fever or cough. Antibiotics are usually not needed but may be prescribed in certain situations. In some cases, an inhaler may be recommended to help reduce shortness of breath and control the cough. A cool mist vaporizer may also be used to help thin bronchial secretions and make it easier to clear the chest.  HOME CARE INSTRUCTIONS  Get plenty of rest.   Drink enough fluids to keep your urine clear or pale yellow (unless you have a medical condition that requires fluid restriction). Increasing fluids may help thin your secretions and will prevent dehydration.   Only take over-the-counter or prescription medicines as directed by your health care provider.   Avoid smoking and secondhand smoke. Exposure to cigarette smoke or irritating chemicals will make bronchitis worse. If you are a smoker, consider using nicotine gum or skin  patches to help control withdrawal symptoms. Quitting smoking will help your lungs heal faster.   Reduce the chances of another bout of acute bronchitis by washing your hands frequently, avoiding people with cold symptoms, and trying not to touch your hands to your mouth, nose, or eyes.   Follow up with your health care provider as directed.  SEEK MEDICAL CARE IF: Your symptoms do not improve after 1 week of treatment.  SEEK IMMEDIATE MEDICAL CARE IF:  You develop an increased fever or chills.   You have chest pain.   You have severe shortness of breath.  You have bloody sputum.   You develop dehydration.  You develop fainting.  You develop repeated vomiting.  You develop a severe headache. MAKE SURE YOU:   Understand these instructions.  Will watch your condition.  Will get help right away if you are not doing well or get worse. Document Released: 09/24/2004 Document Revised: 04/19/2013 Document Reviewed: 02/07/2013 Exit[employer] Patient Information 2014 [employer].  

## 2013-08-19 NOTE — Progress Notes (Signed)
   Subjective:    Patient ID: Brenda [name], female    DOB: 1932-02-12, 79 y.o.   MRN: [id number]  HPI 5d of cough, now fever to 100.4, chest congestion. No sob or cp. No hemoptysis.   Review of Systems CLL no progression    Objective:   Physical Exam  Vitals reviewed. Constitutional: She is oriented to person, place, and time. She appears well-developed and well-nourished. No distress.  HENT:  Head: Normocephalic.  Right Ear: External ear normal.  Left Ear: External ear normal.  Nose: Nose normal.  Mouth/Throat: Oropharynx is clear and moist.  Eyes: Conjunctivae and EOM are normal. Pupils are equal, round, and reactive to light.  Neck: Normal range of motion. Neck supple. No thyromegaly present.  Cardiovascular: Normal rate and normal heart sounds.   Pulmonary/Chest: Effort normal. Not tachypneic. No respiratory distress. She has no decreased breath sounds. She has wheezes. She has rhonchi. She has rales. She exhibits no tenderness.  Musculoskeletal: Normal range of motion.  Lymphadenopathy:    She has no cervical adenopathy.  Neurological: She is alert and oriented to person, place, and time. She exhibits normal muscle tone. Coordination normal.  Psychiatric: She has a normal mood and affect.   Results for orders placed in visit on 08/19/13  POCT CBC      Result Value Range   WBC 11.7 (*) 4.6 - 10.2 K/uL   Lymph, poc 4.5 (*) 0.6 - 3.4   POC LYMPH PERCENT 38.1  10 - 50 %L   MID (cbc) 0.7  0 - 0.9   POC MID % 6.1  0 - 12 %M   POC Granulocyte 6.5  2 - 6.9   Granulocyte percent 55.8  37 - 80 %G   RBC 4.42  4.04 - 5.48 M/uL   Hemoglobin 13.7  12.2 - 16.2 g/dL   HCT, POC 13.0  86.5 - 47.9 %   MCV 99.3 (*) 80 - 97 fL   MCH, POC 31.0  27 - 31.2 pg   MCHC 31.2 (*) 31.8 - 35.4 g/dL   RDW, POC 78.4     Platelet Count, POC 225  142 - 424 K/uL   MPV 8.3  0 - 99.8 fL  POCT INFLUENZA A/B      Result Value Range   Influenza A, POC Negative     Influenza B, POC Negative             Assessment & Plan:  Zpak/Chest care

## 2013-08-30 DIAGNOSIS — R351 Nocturia: Secondary | ICD-10-CM | POA: Diagnosis not present

## 2013-08-30 DIAGNOSIS — N3942 Incontinence without sensory awareness: Secondary | ICD-10-CM | POA: Diagnosis not present

## 2013-08-30 DIAGNOSIS — N3946 Mixed incontinence: Secondary | ICD-10-CM | POA: Diagnosis not present

## 2013-09-28 DIAGNOSIS — R351 Nocturia: Secondary | ICD-10-CM | POA: Diagnosis not present

## 2013-09-28 DIAGNOSIS — R32 Unspecified urinary incontinence: Secondary | ICD-10-CM | POA: Diagnosis not present

## 2013-09-28 DIAGNOSIS — R350 Frequency of micturition: Secondary | ICD-10-CM | POA: Diagnosis not present

## 2013-10-02 DIAGNOSIS — N3946 Mixed incontinence: Secondary | ICD-10-CM | POA: Diagnosis not present

## 2013-10-05 DIAGNOSIS — Z961 Presence of intraocular lens: Secondary | ICD-10-CM | POA: Diagnosis not present

## 2013-10-05 DIAGNOSIS — H01009 Unspecified blepharitis unspecified eye, unspecified eyelid: Secondary | ICD-10-CM | POA: Diagnosis not present

## 2013-10-05 DIAGNOSIS — H579 Unspecified disorder of eye and adnexa: Secondary | ICD-10-CM | POA: Diagnosis not present

## 2013-10-05 DIAGNOSIS — H04129 Dry eye syndrome of unspecified lacrimal gland: Secondary | ICD-10-CM | POA: Diagnosis not present

## 2013-10-09 DIAGNOSIS — M6281 Muscle weakness (generalized): Secondary | ICD-10-CM | POA: Diagnosis not present

## 2013-10-09 DIAGNOSIS — N3942 Incontinence without sensory awareness: Secondary | ICD-10-CM | POA: Diagnosis not present

## 2013-10-09 DIAGNOSIS — N3946 Mixed incontinence: Secondary | ICD-10-CM | POA: Diagnosis not present

## 2013-10-09 DIAGNOSIS — R279 Unspecified lack of coordination: Secondary | ICD-10-CM | POA: Diagnosis not present

## 2013-10-18 DIAGNOSIS — Z800 Family history of malignant neoplasm of digestive organs: Secondary | ICD-10-CM | POA: Diagnosis not present

## 2013-10-18 DIAGNOSIS — Z1211 Encounter for screening for malignant neoplasm of colon: Secondary | ICD-10-CM | POA: Diagnosis not present

## 2013-10-19 DIAGNOSIS — R279 Unspecified lack of coordination: Secondary | ICD-10-CM | POA: Diagnosis not present

## 2013-10-19 DIAGNOSIS — M6281 Muscle weakness (generalized): Secondary | ICD-10-CM | POA: Diagnosis not present

## 2013-10-19 DIAGNOSIS — N8111 Cystocele, midline: Secondary | ICD-10-CM | POA: Diagnosis not present

## 2013-10-19 DIAGNOSIS — N3946 Mixed incontinence: Secondary | ICD-10-CM | POA: Diagnosis not present

## 2013-10-20 NOTE — Telephone Encounter (Signed)
Pt came in for her visit, closing encounter

## 2013-10-23 DIAGNOSIS — R279 Unspecified lack of coordination: Secondary | ICD-10-CM | POA: Diagnosis not present

## 2013-10-23 DIAGNOSIS — N8111 Cystocele, midline: Secondary | ICD-10-CM | POA: Diagnosis not present

## 2013-10-23 DIAGNOSIS — M6281 Muscle weakness (generalized): Secondary | ICD-10-CM | POA: Diagnosis not present

## 2013-10-23 DIAGNOSIS — N3946 Mixed incontinence: Secondary | ICD-10-CM | POA: Diagnosis not present

## 2013-11-14 DIAGNOSIS — R350 Frequency of micturition: Secondary | ICD-10-CM | POA: Diagnosis not present

## 2013-11-14 DIAGNOSIS — N3946 Mixed incontinence: Secondary | ICD-10-CM | POA: Diagnosis not present

## 2013-11-14 DIAGNOSIS — M6281 Muscle weakness (generalized): Secondary | ICD-10-CM | POA: Diagnosis not present

## 2013-11-14 DIAGNOSIS — N952 Postmenopausal atrophic vaginitis: Secondary | ICD-10-CM | POA: Diagnosis not present

## 2013-11-14 DIAGNOSIS — R351 Nocturia: Secondary | ICD-10-CM | POA: Diagnosis not present

## 2013-11-27 DIAGNOSIS — F3289 Other specified depressive episodes: Secondary | ICD-10-CM | POA: Diagnosis not present

## 2013-11-27 DIAGNOSIS — Z6827 Body mass index (BMI) 27.0-27.9, adult: Secondary | ICD-10-CM | POA: Diagnosis not present

## 2013-11-27 DIAGNOSIS — F329 Major depressive disorder, single episode, unspecified: Secondary | ICD-10-CM | POA: Diagnosis not present

## 2013-11-27 DIAGNOSIS — R7301 Impaired fasting glucose: Secondary | ICD-10-CM | POA: Diagnosis not present

## 2013-11-27 DIAGNOSIS — C9110 Chronic lymphocytic leukemia of B-cell type not having achieved remission: Secondary | ICD-10-CM | POA: Diagnosis not present

## 2013-11-27 DIAGNOSIS — K219 Gastro-esophageal reflux disease without esophagitis: Secondary | ICD-10-CM | POA: Diagnosis not present

## 2013-11-27 DIAGNOSIS — R32 Unspecified urinary incontinence: Secondary | ICD-10-CM | POA: Diagnosis not present

## 2013-11-28 ENCOUNTER — Other Ambulatory Visit: Payer: Self-pay

## 2013-11-28 MED ORDER — CITALOPRAM HYDROBROMIDE 10 MG PO TABS: 10.0000 mg | ORAL_TABLET | Freq: Every day | ORAL | Status: DC

## 2014-01-18 DIAGNOSIS — R310 Gross hematuria: Secondary | ICD-10-CM | POA: Diagnosis not present

## 2014-01-18 DIAGNOSIS — N390 Urinary tract infection, site not specified: Secondary | ICD-10-CM | POA: Diagnosis not present

## 2014-01-18 DIAGNOSIS — R300 Dysuria: Secondary | ICD-10-CM | POA: Diagnosis not present

## 2014-01-25 DIAGNOSIS — N390 Urinary tract infection, site not specified: Secondary | ICD-10-CM | POA: Diagnosis not present

## 2014-02-01 ENCOUNTER — Ambulatory Visit: Payer: Medicare Other | Admitting: Nurse Practitioner

## 2014-02-13 DIAGNOSIS — N3946 Mixed incontinence: Secondary | ICD-10-CM | POA: Diagnosis not present

## 2014-02-13 DIAGNOSIS — N3942 Incontinence without sensory awareness: Secondary | ICD-10-CM | POA: Diagnosis not present

## 2014-02-22 DIAGNOSIS — H11449 Conjunctival cysts, unspecified eye: Secondary | ICD-10-CM | POA: Diagnosis not present

## 2014-02-22 DIAGNOSIS — Z961 Presence of intraocular lens: Secondary | ICD-10-CM | POA: Diagnosis not present

## 2014-02-22 DIAGNOSIS — H2640 Unspecified secondary cataract: Secondary | ICD-10-CM | POA: Diagnosis not present

## 2014-02-26 DIAGNOSIS — L820 Inflamed seborrheic keratosis: Secondary | ICD-10-CM | POA: Diagnosis not present

## 2014-02-26 DIAGNOSIS — D045 Carcinoma in situ of skin of trunk: Secondary | ICD-10-CM | POA: Diagnosis not present

## 2014-02-26 DIAGNOSIS — Z85828 Personal history of other malignant neoplasm of skin: Secondary | ICD-10-CM | POA: Diagnosis not present

## 2014-02-26 DIAGNOSIS — C4441 Basal cell carcinoma of skin of scalp and neck: Secondary | ICD-10-CM | POA: Diagnosis not present

## 2014-02-26 DIAGNOSIS — L821 Other seborrheic keratosis: Secondary | ICD-10-CM | POA: Diagnosis not present

## 2014-02-26 DIAGNOSIS — D485 Neoplasm of uncertain behavior of skin: Secondary | ICD-10-CM | POA: Diagnosis not present

## 2014-03-09 ENCOUNTER — Ambulatory Visit (INDEPENDENT_AMBULATORY_CARE_PROVIDER_SITE_OTHER): Payer: Medicare Other | Admitting: Nurse Practitioner

## 2014-03-09 ENCOUNTER — Encounter: Payer: Self-pay | Admitting: Nurse Practitioner

## 2014-03-09 ENCOUNTER — Encounter (INDEPENDENT_AMBULATORY_CARE_PROVIDER_SITE_OTHER): Payer: Self-pay

## 2014-03-09 VITALS — BP 123/62 | HR 72 | Ht 66.0 in | Wt 163.0 lb

## 2014-03-09 DIAGNOSIS — R413 Other amnesia: Secondary | ICD-10-CM | POA: Diagnosis not present

## 2014-03-09 NOTE — Patient Instructions (Signed)
Memory score is stable Continue moderate exercise for overall general health Followup in 6-8 months

## 2014-03-09 NOTE — Progress Notes (Signed)
[facility]  PATIENT: Brenda [name] DOB: 02/25/1932   REASON FOR VISIT: follow up for memory loss   HISTORY OF PRESENT ILLNESS:Brenda [name], 80 yr old right-handed Caucasian female,alone at today's visit returns for followup. She was last seen 06/02/13.  She continues to have a lot of anxiety and stress, currently redoing part of her house which has been time-consuming and sometimes she is unable to make quick decisions. She remains on Celexa. She continues to walk daily and goes to an exercise class 3 times a week. She is active socially and does a lot of reading. She is independent in all activities of daily living. She continues to drive without difficulty. She has not wanted repeat neuropsych testing. Normal MRI of the brain and normal dementia labs. She returns for reevaluation   HISTORY: She is referred by her primary care physician for evaluation of short-term memory trouble. She was initially evaluated by Dr. Krista [name] 02/12/11. It has been noticeable over the past 4 years, most of by herself, not by her family members. She noticed she has to concentrate to remember details, she tends to forget people's names difficult to come up with words sometimes, but she noticed similar problem in her husband and in her friends.  She tends to read a lot, history books, she has no difficulty keeping up with her reading, she is also socially active, she denied trouble handling her finances, or driving. Her father had Alzheimers disease. She has a hx of depression. She is independent with ADL's and driving. She lives at [facility] some of the time. No other changes.  10/04/12: Patient returns for followup. Neuropsych testing by Dr. Valentina [name] after last visit with Dr. Krista [name] 02/01/12. His dx was probable mild cognitive impairment -amnestic type as a cause of her memory retrieval. He recommends repeat testing in July 2014. She restarted low dose Celexa in June 2013 after complaints of anxiety and mild  depression. She says her memory is the same. She continues to drive without difficulty.  REVIEW OF SYSTEMS: Full 14 system review of systems performed and notable only for those listed, all others are neg:  Constitutional: Fatigue  Cardiovascular: N/A  Ear/Nose/Throat: Hearing loss Skin: N/A  Eyes: N/A  Respiratory: Shortness of breath Gastroitestinal: Incontinence of bladder Hematology/Lymphatic: Easy bruising Endocrine: N/A Musculoskeletal:N/A  Allergy/Immunology: N/A  Neurological: Memory loss stable Psychiatric: Depression  Sleep : NA   ALLERGIES: Allergies  Allergen Reactions  . Adhesive [Tape]   . Codeine   . Sulfa Antibiotics     HOME MEDICATIONS: Outpatient Prescriptions Prior to Visit  Medication Sig Dispense Refill  . atorvastatin (LIPITOR) 10 MG tablet Take 10 mg by mouth daily.      . Calcium Carbonate-Vitamin D (CALCIUM + D PO) Take 10 mg by mouth daily.      . citalopram (CELEXA) 10 MG tablet Take 1 tablet (10 mg total) by mouth daily.  90 tablet  1  . Estradiol Acetate (FEMRING) 0.1 MG/24HR RING Place 1 each vaginally.      Marland Kitchen LORazepam (ATIVAN) 0.5 MG tablet Take 0.5 mg by mouth daily.       . Multiple Vitamin (MULTIVITAMIN) capsule Take 1 capsule by mouth daily.      . pantoprazole (PROTONIX) 40 MG tablet       . ZETIA 10 MG tablet       . zolpidem (AMBIEN) 5 MG tablet Take 5 mg by mouth daily.      . chlorhexidine (PERIDEX) 0.12 %  solution       . TOBRADEX ophthalmic ointment        No facility-administered medications prior to visit.    PAST MEDICAL HISTORY: Past Medical History  Diagnosis Date  . Mild cognitive impairment   . CLL (chronic lymphocytic leukemia)   . Reflux   . Depression   . Anxiety   . Memory loss     PAST SURGICAL HISTORY: Past Surgical History  Procedure Laterality Date  . Appendectomy      FAMILY HISTORY: Family History  Problem Relation Age of Onset  . Alzheimer's disease Father     SOCIAL HISTORY: History    Social History  . Marital Status: Married    Spouse Name: [name]    Number of Children: 2  . Years of Education: college   Occupational History  . Not on file.   Social History Main Topics  . Smoking status: Never Smoker   . Smokeless tobacco: Never Used  . Alcohol Use: Yes     Comment: wine daily   . Drug Use: No  . Sexual Activity: Not on file   Other Topics Concern  . Not on file   Social History Narrative   Patient lives at home with husband [name].    Patient is retired.    Patient has a college education.    Patient has 2 children.    Patient drinks 2 cups of coffee daily.      PHYSICAL EXAM  Filed Vitals:   03/09/14 1109  BP: 123/62  Pulse: 72  Height: 5\' 6"  (1.676 m)  Weight: 163 lb (73.936 kg)   Body mass index is 26.32 kg/(m^2). Generalized: Well developed, in no acute distress  Head: normocephalic and atraumatic,. Oropharynx benign  Neck: Supple, no carotid bruits  Cardiac: Regular rate rhythm, no murmur  Musculoskeletal: No deformity  Neurological examination  Mentation: Alert oriented to time, place, history taking. MMSE 29/30. AFT 17.GDS=3 Follows all commands speech and language fluent  Cranial nerve II-XII: Pupils were equal round reactive to light extraocular movements were full, visual field were full on confrontational test. Facial sensation and strength were normal. hearing was intact to finger rubbing bilaterally. Uvula tongue midline. head turning and shoulder shrug and were normal and symmetric.Tongue protrusion into cheek strength was normal.  Motor: normal bulk and tone, full strength in the BUE, BLE, fine finger movements normal, no pronator drift. No focal weakness  Sensory: normal and symmetric to light touch, pinprick, and vibration  Coordination: finger-nose-finger, heel-to-shin bilaterally, no dysmetria  Reflexes: Brachioradialis 2/2, biceps 2/2, triceps 2/2, patellar 2/2, Achilles 2/2, plantar responses were flexor bilaterally.   Gait and Station: Rising up from seated position without assistance, normal stance, without trunk ataxia, moderate stride, good arm swing, smooth turning, able to perform tiptoe, and heel walking without difficulty. Tandem gait normal . No assistive device   DIAGNOSTIC DATA (LABS, IMAGING, TESTING) - I reviewed patient records, labs, notes, testing and imaging myself where available.  Lab Results  Component Value Date   WBC 11.7* 08/19/2013   HGB 13.7 08/19/2013   HCT 43.9 08/19/2013   MCV 99.3* 08/19/2013   PLT 272 02/04/2010     ASSESSMENT AND PLAN  80 y.o. year old female  has a past medical history of Mild cognitive impairment; CLL (chronic lymphocytic leukemia); Reflux; Depression; Anxiety; and Memory loss. here to followup.  Memory score is stable Continue moderate exercise for overall general health Followup in 6-8 months Dennie Bible, GNP, [facility],  APRN  [facility] [address] [place], [place] [address] [phone]

## 2014-03-12 DIAGNOSIS — L820 Inflamed seborrheic keratosis: Secondary | ICD-10-CM | POA: Diagnosis not present

## 2014-03-12 DIAGNOSIS — L0889 Other specified local infections of the skin and subcutaneous tissue: Secondary | ICD-10-CM | POA: Diagnosis not present

## 2014-03-12 DIAGNOSIS — Z85828 Personal history of other malignant neoplasm of skin: Secondary | ICD-10-CM | POA: Diagnosis not present

## 2014-03-22 DIAGNOSIS — H26499 Other secondary cataract, unspecified eye: Secondary | ICD-10-CM | POA: Diagnosis not present

## 2014-03-22 DIAGNOSIS — H2640 Unspecified secondary cataract: Secondary | ICD-10-CM | POA: Diagnosis not present

## 2014-04-23 ENCOUNTER — Encounter (HOSPITAL_COMMUNITY): Payer: Self-pay | Admitting: Emergency Medicine

## 2014-04-23 ENCOUNTER — Emergency Department (HOSPITAL_COMMUNITY): Payer: Medicare Other

## 2014-04-23 ENCOUNTER — Emergency Department (HOSPITAL_COMMUNITY)
Admission: EM | Admit: 2014-04-23 | Discharge: 2014-04-23 | Disposition: A | Discharge status: Home / Self Care | Payer: Medicare Other | Attending: Emergency Medicine | Admitting: Emergency Medicine

## 2014-04-23 DIAGNOSIS — R0602 Shortness of breath: Secondary | ICD-10-CM | POA: Insufficient documentation

## 2014-04-23 DIAGNOSIS — Z87898 Personal history of other specified conditions: Secondary | ICD-10-CM | POA: Insufficient documentation

## 2014-04-23 DIAGNOSIS — F3289 Other specified depressive episodes: Secondary | ICD-10-CM | POA: Insufficient documentation

## 2014-04-23 DIAGNOSIS — Z8669 Personal history of other diseases of the nervous system and sense organs: Secondary | ICD-10-CM | POA: Insufficient documentation

## 2014-04-23 DIAGNOSIS — F329 Major depressive disorder, single episode, unspecified: Secondary | ICD-10-CM | POA: Diagnosis not present

## 2014-04-23 DIAGNOSIS — F411 Generalized anxiety disorder: Secondary | ICD-10-CM | POA: Diagnosis not present

## 2014-04-23 DIAGNOSIS — Z79899 Other long term (current) drug therapy: Secondary | ICD-10-CM | POA: Diagnosis not present

## 2014-04-23 DIAGNOSIS — R9431 Abnormal electrocardiogram [ECG] [EKG]: Secondary | ICD-10-CM | POA: Insufficient documentation

## 2014-04-23 DIAGNOSIS — K219 Gastro-esophageal reflux disease without esophagitis: Secondary | ICD-10-CM | POA: Insufficient documentation

## 2014-04-23 LAB — CBC WITH DIFFERENTIAL/PLATELET
BASOS PCT: 0 % (ref 0–1)
Basophils Absolute: 0 10*3/uL (ref 0.0–0.1)
Eosinophils Absolute: 0.1 10*3/uL (ref 0.0–0.7)
Eosinophils Relative: 1 % (ref 0–5)
HCT: 41.6 % (ref 36.0–46.0)
Hemoglobin: 14.4 g/dL (ref 12.0–15.0)
Lymphocytes Relative: 67 % — ABNORMAL HIGH (ref 12–46)
Lymphs Abs: 8.8 10*3/uL — ABNORMAL HIGH (ref 0.7–4.0)
MCH: 31.2 pg (ref 26.0–34.0)
MCHC: 34.6 g/dL (ref 30.0–36.0)
MCV: 90.2 fL (ref 78.0–100.0)
Monocytes Absolute: 0.5 10*3/uL (ref 0.1–1.0)
Monocytes Relative: 4 % (ref 3–12)
NRBC: 3 /100{WBCs} — AB
Neutro Abs: 3.6 10*3/uL (ref 1.7–7.7)
Neutrophils Relative %: 28 % — ABNORMAL LOW (ref 43–77)
Platelets: 257 10*3/uL (ref 150–400)
RBC: 4.61 MIL/uL (ref 3.87–5.11)
RDW: 13.7 % (ref 11.5–15.5)
WBC: 13 10*3/uL — AB (ref 4.0–10.5)

## 2014-04-23 LAB — PRO B NATRIURETIC PEPTIDE: Pro B Natriuretic peptide (BNP): 79.8 pg/mL (ref 0–450)

## 2014-04-23 LAB — BASIC METABOLIC PANEL
ANION GAP: 15 (ref 5–15)
BUN: 15 mg/dL (ref 6–23)
CALCIUM: 9.7 mg/dL (ref 8.4–10.5)
CO2: 23 meq/L (ref 19–32)
Chloride: 102 mEq/L (ref 96–112)
Creatinine, Ser: 0.75 mg/dL (ref 0.50–1.10)
GFR calc non Af Amer: 78 mL/min — ABNORMAL LOW (ref >90)
Glucose, Bld: 119 mg/dL — ABNORMAL HIGH (ref 70–99)
Potassium: 4.2 mEq/L (ref 3.7–5.3)
SODIUM: 140 meq/L (ref 137–147)

## 2014-04-23 LAB — URINALYSIS, ROUTINE W REFLEX MICROSCOPIC
BILIRUBIN URINE: NEGATIVE
Glucose, UA: NEGATIVE mg/dL
Hgb urine dipstick: NEGATIVE
KETONES UR: NEGATIVE mg/dL
NITRITE: NEGATIVE
PH: 7 (ref 5.0–8.0)
Protein, ur: NEGATIVE mg/dL
SPECIFIC GRAVITY, URINE: 1.017 (ref 1.005–1.030)
Urobilinogen, UA: 0.2 mg/dL (ref 0.0–1.0)

## 2014-04-23 LAB — URINE MICROSCOPIC-ADD ON

## 2014-04-23 LAB — I-STAT TROPONIN, ED: Troponin i, poc: 0 ng/mL (ref 0.00–0.08)

## 2014-04-23 LAB — D-DIMER, QUANTITATIVE: D-Dimer, Quant: 0.46 ug/mL-FEU (ref 0.00–0.48)

## 2014-04-23 NOTE — ED Notes (Signed)
Pt states for several weeks has had shortness of breath when she bends over or reaches up; today bent over and developed shortness of breath and heavy breathing; denies chest pain; states symptoms now x 45 mins; pt hyperventilating while talking

## 2014-04-23 NOTE — ED Provider Notes (Signed)
CSN: [id number]     Arrival date & time 04/23/14  1421 History   First MD Initiated Contact with Patient 04/23/14 1504     Chief Complaint  Patient presents with  . Shortness of Breath   Patient is a 80 y.o. female presenting with shortness of breath.  Shortness of Breath   Patient is a 80 y.o. Female who presents to the ED with intermittent shortness of breath.  Patient states that for that past couple months she has had intermittient episodes of shortness of breath that last for 30 seconds and then go away with rest.  Patient states that these episodes are usually brought on by leaning forward, reaching up, or with exertion.  Patient states that she was going to mention it to her PCP at here usual visit, however today she developed some dyspnea from organizing her house and had shortness of breath that lasted for 45 minutes to an hour.  Patient denies any chest pain, fevers, chills, cough, congestion, wheezing, PND, orthopnea, abdominal pain, diarrhea, constipation, melena, hematochezia, urinary complaints.  Patient does admit to some mild bilateral pitting edema to her legs occasionally.  All other ROS are negative at this time.  Patient does states that she has a history of CLL which is not currently being treated.  Past Medical History  Diagnosis Date  . Mild cognitive impairment   . CLL (chronic lymphocytic leukemia)   . Reflux   . Depression   . Anxiety   . Memory loss    Past Surgical History  Procedure Laterality Date  . Appendectomy     Family History  Problem Relation Age of Onset  . Alzheimer's disease Father    History  Substance Use Topics  . Smoking status: Never Smoker   . Smokeless tobacco: Never Used  . Alcohol Use: Yes     Comment: wine daily    OB History   Grav Para Term Preterm Abortions TAB SAB Ect Mult Living                 Review of Systems  Respiratory: Positive for shortness of breath.    See HPI   Allergies  Adhesive; Codeine; and Sulfa  antibiotics  Home Medications   Prior to Admission medications   Medication Sig Start Date End Date Taking? Authorizing Provider  atorvastatin (LIPITOR) 20 MG tablet Take 20 mg by mouth at bedtime.   Yes Historical Provider, MD  Calcium Carbonate-Vitamin D (CALCIUM + D PO) Take 10 mg by mouth daily.   Yes Historical Provider, MD  cholecalciferol (VITAMIN D) 1000 UNITS tablet Take 1,000 Units by mouth daily.   Yes Historical Provider, MD  citalopram (CELEXA) 10 MG tablet Take 1 tablet (10 mg total) by mouth daily. 11/28/13  Yes Marcial Pacas, MD  estradiol (ESTRING) 2 MG vaginal ring Place 2 mg vaginally every 3 (three) months. follow package directions   Yes Historical Provider, MD  LORazepam (ATIVAN) 0.5 MG tablet Take 0.5 mg by mouth at bedtime.    Yes Historical Provider, MD  Multiple Vitamin (MULTIVITAMIN) capsule Take 1 capsule by mouth daily.   Yes Historical Provider, MD  pantoprazole (PROTONIX) 40 MG tablet Take 40 mg by mouth daily.  05/29/13  Yes Historical Provider, MD  vitamin C (ASCORBIC ACID) 500 MG tablet Take 500 mg by mouth daily.   Yes Historical Provider, MD  ZETIA 10 MG tablet Take 10 mg by mouth at bedtime.  05/19/13  Yes Historical Provider, MD  zolpidem Lorrin Mais)  5 MG tablet Take 5 mg by mouth daily.   Yes Historical Provider, MD   BP 128/49  Pulse 67  Temp(Src) 98.2 F (36.8 C)  Resp 12  SpO2 98% Physical Exam  Nursing note and vitals reviewed. Constitutional: She is oriented to person, place, and time. She appears well-developed and well-nourished. No distress.  HENT:  Head: Normocephalic and atraumatic.  Mouth/Throat: Oropharynx is clear and moist. No oropharyngeal exudate.  Eyes: Conjunctivae and EOM are normal. Pupils are equal, round, and reactive to light. No scleral icterus.  Neck: Normal range of motion. Neck supple. No JVD present. No thyromegaly present.  Cardiovascular: Normal rate, regular rhythm, normal heart sounds and intact distal pulses.  Exam reveals  no gallop and no friction rub.   No murmur heard. No calf tenderness bilaterally.    Pulmonary/Chest: Effort normal and breath sounds normal. No respiratory distress. She has no wheezes. She has no rales. She exhibits no tenderness.  Abdominal: Soft. Bowel sounds are normal. She exhibits no distension and no mass. There is no tenderness. There is no rebound and no guarding.  Musculoskeletal: Normal range of motion.  Lymphadenopathy:    She has no cervical adenopathy.  Neurological: She is alert and oriented to person, place, and time. No cranial nerve deficit. Coordination normal.  Skin: Skin is warm and dry. She is not diaphoretic.  Psychiatric: She has a normal mood and affect. Her behavior is normal. Judgment and thought content normal.    ED Course  Procedures (including critical care time) Labs Review Labs Reviewed  CBC WITH DIFFERENTIAL - Abnormal; Notable for the following:    WBC 13.0 (*)    Neutrophils Relative % 28 (*)    Lymphocytes Relative 67 (*)    nRBC 3 (*)    Lymphs Abs 8.8 (*)    All other components within normal limits  BASIC METABOLIC PANEL - Abnormal; Notable for the following:    Glucose, Bld 119 (*)    GFR calc non Af Amer 78 (*)    All other components within normal limits  URINALYSIS, ROUTINE W REFLEX MICROSCOPIC - Abnormal; Notable for the following:    APPearance CLOUDY (*)    Leukocytes, UA TRACE (*)    All other components within normal limits  URINE CULTURE  D-DIMER, QUANTITATIVE  PRO B NATRIURETIC PEPTIDE  URINE MICROSCOPIC-ADD ON  PATHOLOGIST SMEAR REVIEW  I-STAT TROPOININ, ED    Imaging Review Dg Chest 2 View  04/23/2014   CLINICAL DATA:  Shortness of breath, nonsmoker  EXAM: CHEST  2 VIEW  COMPARISON:  None.  FINDINGS: The heart size and mediastinal contours are within normal limits. Both lungs are clear. The visualized skeletal structures are unremarkable.  IMPRESSION: No active cardiopulmonary disease.   Electronically Signed   By:  Skipper Cliche M.D.   On: 04/23/2014 15:32     EKG Interpretation   Date/Time:  Monday April 23 2014 14:40:09 EDT Ventricular Rate:  71 PR Interval:  183 QRS Duration: 73 QT Interval:  405 QTC Calculation: 440 R Axis:   40 Text Interpretation:  Sinus rhythm Low voltage, precordial leads Baseline  wander in lead(s) II aVF V2 V3 No old tracing to compare Confirmed by  Debby Freiberg [phone]) on 04/23/2014 5:52:13 PM      MDM   Final diagnoses:  Shortness of breath  EKG abnormality   Patient is a 80 y.o. Female who presents to the ED with intermittent episodes of shortness of breath.  Patient has  a very benign physical exam and is non-toxic appearing with stable vitals.  She is satting at 100% on room air.  CBC shows some mild leukocytosis but prior lab review reveals this to be the patient's baseline.  BMP is without acute abnormality at this time.  CXR shows no acute disease process.  Given history of CLL D-dimer was ordered at this time.  D-dimer is negative.  UA reveals trace leukocytes but no clear infection.  Urine has been sent for culture.  Given no symptoms will not order antibiotics at this time. BNP is negative.  Troponin is negative.  EKG shows T wave inversion in V2 with no other abnormalities at this time and no previous EKGs to compare to.  Patient is stable for discharge at this time.  Patient is to follow-up with Dr. Joylene Draft for further evaluation and potential need for a holter monitor.  Patient was also seen by Dr. Colin Rhein and discussed with him.  Dr. Colin Rhein agrees with the above plan.  Patient was told to return to th ED for ACS symptoms and worsening shortness of breath.  Patient agrees with the above plan and all questions were answered.      Cherylann Parr, PA-C 04/23/14 1817

## 2014-04-23 NOTE — Discharge Instructions (Signed)
Shortness of Breath °Shortness of breath means you have trouble breathing. Shortness of breath needs medical care right away. °HOME CARE  °· Do not smoke. °· Avoid being around chemicals or things (paint fumes, dust) that may bother your breathing. °· Rest as needed. Slowly begin your normal activities. °· Only take medicines as told by your doctor. °· Keep all doctor visits as told. °GET HELP RIGHT AWAY IF:  °· Your shortness of breath gets worse. °· You feel lightheaded, pass out (faint), or have a cough that is not helped by medicine. °· You cough up blood. °· You have pain with breathing. °· You have pain in your chest, arms, shoulders, or belly (abdomen). °· You have a fever. °· You cannot walk up stairs or exercise the way you normally do. °· You do not get better in the time expected. °· You have a hard time doing normal activities even with rest. °· You have problems with your medicines. °· You have any new symptoms. °MAKE SURE YOU: °· Understand these instructions. °· Will watch your condition. °· Will get help right away if you are not doing well or get worse. °Document Released: 02/03/2008 Document Revised: 08/22/2013 Document Reviewed: 11/02/2011 °[employer]® Patient Information ©2015 [employer]. This information is not intended to replace advice given to you by your health care provider. Make sure you discuss any questions you have with your health care provider. ° °

## 2014-04-24 LAB — PATHOLOGIST SMEAR REVIEW

## 2014-04-24 LAB — URINE CULTURE: Special Requests: NORMAL

## 2014-04-24 NOTE — ED Provider Notes (Signed)
Medical screening examination/treatment/procedure(s) were conducted as a shared visit with non-physician practitioner(s) and myself.  I personally evaluated the patient during the encounter.   EKG Interpretation   Date/Time:  Monday April 23 2014 14:40:09 EDT Ventricular Rate:  71 PR Interval:  183 QRS Duration: 73 QT Interval:  405 QTC Calculation: 440 R Axis:   40 Text Interpretation:  Sinus rhythm Low voltage, precordial leads Baseline  wander in lead(s) II aVF V2 V3 No old tracing to compare Confirmed by  Debby Freiberg [phone]) on 04/23/2014 5:52:13 PM        I performed an examination on the patient including cardiac, pulmonary, and gi systems which were unremarkable except as documented by PA.  Briefly, patient is a 80 year old female who presented with intermittent episodes of shortness of breath without clear precipitating or exacerbating or alleviating factors. On my exam the patient is currently without symptoms. We checked troponin and d-dimer both of which were unremarkable. Will have her follow up with cardiology, and patient was given standard return precautions for dyspnea, voiced understanding and will followup  Debby Freiberg, MD 04/24/14 [phone]

## 2014-04-25 DIAGNOSIS — C9110 Chronic lymphocytic leukemia of B-cell type not having achieved remission: Secondary | ICD-10-CM | POA: Diagnosis not present

## 2014-04-25 DIAGNOSIS — R0602 Shortness of breath: Secondary | ICD-10-CM | POA: Diagnosis not present

## 2014-04-25 DIAGNOSIS — Z6827 Body mass index (BMI) 27.0-27.9, adult: Secondary | ICD-10-CM | POA: Diagnosis not present

## 2014-05-24 ENCOUNTER — Other Ambulatory Visit: Payer: Self-pay | Admitting: Neurology

## 2014-06-20 DIAGNOSIS — E785 Hyperlipidemia, unspecified: Secondary | ICD-10-CM | POA: Diagnosis not present

## 2014-06-20 DIAGNOSIS — M859 Disorder of bone density and structure, unspecified: Secondary | ICD-10-CM | POA: Diagnosis not present

## 2014-06-20 DIAGNOSIS — M8580 Other specified disorders of bone density and structure, unspecified site: Secondary | ICD-10-CM | POA: Diagnosis not present

## 2014-06-20 DIAGNOSIS — R7301 Impaired fasting glucose: Secondary | ICD-10-CM | POA: Diagnosis not present

## 2014-06-27 DIAGNOSIS — Z23 Encounter for immunization: Secondary | ICD-10-CM | POA: Diagnosis not present

## 2014-06-27 DIAGNOSIS — M8580 Other specified disorders of bone density and structure, unspecified site: Secondary | ICD-10-CM | POA: Diagnosis not present

## 2014-06-27 DIAGNOSIS — Z008 Encounter for other general examination: Secondary | ICD-10-CM | POA: Diagnosis not present

## 2014-06-27 DIAGNOSIS — K219 Gastro-esophageal reflux disease without esophagitis: Secondary | ICD-10-CM | POA: Diagnosis not present

## 2014-06-27 DIAGNOSIS — C9100 Acute lymphoblastic leukemia not having achieved remission: Secondary | ICD-10-CM | POA: Diagnosis not present

## 2014-06-27 DIAGNOSIS — E785 Hyperlipidemia, unspecified: Secondary | ICD-10-CM | POA: Diagnosis not present

## 2014-06-27 DIAGNOSIS — Z1212 Encounter for screening for malignant neoplasm of rectum: Secondary | ICD-10-CM | POA: Diagnosis not present

## 2014-06-27 DIAGNOSIS — R0602 Shortness of breath: Secondary | ICD-10-CM | POA: Diagnosis not present

## 2014-06-27 DIAGNOSIS — Z1389 Encounter for screening for other disorder: Secondary | ICD-10-CM | POA: Diagnosis not present

## 2014-06-27 DIAGNOSIS — R7301 Impaired fasting glucose: Secondary | ICD-10-CM | POA: Diagnosis not present

## 2014-06-27 DIAGNOSIS — F329 Major depressive disorder, single episode, unspecified: Secondary | ICD-10-CM | POA: Diagnosis not present

## 2014-07-18 ENCOUNTER — Encounter: Payer: Self-pay | Admitting: Neurology

## 2014-07-24 ENCOUNTER — Encounter: Payer: Self-pay | Admitting: Neurology

## 2014-09-06 DIAGNOSIS — L821 Other seborrheic keratosis: Secondary | ICD-10-CM | POA: Diagnosis not present

## 2014-09-06 DIAGNOSIS — L570 Actinic keratosis: Secondary | ICD-10-CM | POA: Diagnosis not present

## 2014-09-06 DIAGNOSIS — Z85828 Personal history of other malignant neoplasm of skin: Secondary | ICD-10-CM | POA: Diagnosis not present

## 2014-09-06 DIAGNOSIS — D485 Neoplasm of uncertain behavior of skin: Secondary | ICD-10-CM | POA: Diagnosis not present

## 2014-09-10 ENCOUNTER — Ambulatory Visit: Payer: Medicare Other | Admitting: Neurology

## 2014-09-13 ENCOUNTER — Encounter: Payer: Self-pay | Admitting: Neurology

## 2014-09-13 ENCOUNTER — Ambulatory Visit (INDEPENDENT_AMBULATORY_CARE_PROVIDER_SITE_OTHER): Payer: Medicare Other | Admitting: Neurology

## 2014-09-13 VITALS — BP 136/75 | HR 79 | Ht 66.0 in | Wt 172.0 lb

## 2014-09-13 DIAGNOSIS — R413 Other amnesia: Secondary | ICD-10-CM | POA: Diagnosis not present

## 2014-09-13 MED ORDER — CITALOPRAM HYDROBROMIDE 10 MG PO TABS: 10.0000 mg | ORAL_TABLET | Freq: Every day | ORAL | Status: DC

## 2014-09-13 NOTE — Progress Notes (Signed)
[facility]  PATIENT: Brenda [name] DOB: [birth year]  HISTORY OF PRESENT ILLNESS:Brenda [name], 80  yr old right-handed Caucasian female,alone at today's visit returns for followup. She was last seen 06/02/13.  She continues to have a lot of anxiety and stress, currently redoing part of her house which has been time-consuming and sometimes she is unable to make quick decisions. She remains on Celexa. She continues to walk daily and goes to an exercise class 3 times a week. She is active socially and does a lot of reading. She is independent in all activities of daily living. She continues to drive without difficulty. She has not wanted repeat neuropsych testing. Normal MRI of the brain and normal dementia labs. She returns for reevaluation   HISTORY: She is referred by her primary care physician for evaluation of short-term memory trouble. Initial evaluation June 2012  She began to notice gradual onset memory trouble since 2008, most of by herself, not by her family members. She noticed she has to concentrate to remember details, she tends to forget people's names difficult to come up with words sometimes, but she noticed similar problem in her husband and in her friends.   She tends to read a lot, history books, she has no difficulty keeping up with her reading, she is also socially active, she denied trouble handling her finances, or driving. Her father had Alzheimers disease. She has a hx of depression. She is independent with ADL's and driving. She lives at [employer] [facility] some of the time. No other changes.   Neuropsych testing by Dr. Valentina [name] in  02/01/12 showed probable mild cognitive impairment -amnestic type as a cause of her memory retrieval. He recommends repeat testing in July 2014.   She restarted low dose Celexa in June 2013 after complaints of anxiety and mild depression. She says her memory is the same. She continues to drive without difficulty.   UPDATE Jan 14th 2016: She  exercise regularly, reads well, still drive without difficulties,  REVIEW OF SYSTEMS: Full 14 system review of systems performed and notable only for those listed, all others are neg:  Hearing loss, shortness of breath, incontinence of bladder, bruise easily, memory loss    ALLERGIES: Allergies  Allergen Reactions  . Adhesive [Tape]   . Codeine   . Sulfa Antibiotics     HOME MEDICATIONS: Outpatient Prescriptions Prior to Visit  Medication Sig Dispense Refill  . atorvastatin (LIPITOR) 20 MG tablet Take 20 mg by mouth at bedtime.    . Calcium Carbonate-Vitamin D (CALCIUM + D PO) Take 10 mg by mouth daily.    . cholecalciferol (VITAMIN D) 1000 UNITS tablet Take 1,000 Units by mouth daily.    . citalopram (CELEXA) 10 MG tablet TAKE 1 TABLET ONCE DAILY. 90 tablet 1  . estradiol (ESTRING) 2 MG vaginal ring Place 2 mg vaginally every 3 (three) months. follow package directions    . LORazepam (ATIVAN) 0.5 MG tablet Take 0.5 mg by mouth at bedtime.     . Multiple Vitamin (MULTIVITAMIN) capsule Take 1 capsule by mouth daily.    . pantoprazole (PROTONIX) 40 MG tablet Take 40 mg by mouth daily.     . vitamin C (ASCORBIC ACID) 500 MG tablet Take 500 mg by mouth daily.    Marland Kitchen ZETIA 10 MG tablet Take 10 mg by mouth at bedtime.     Marland Kitchen zolpidem (AMBIEN) 5 MG tablet Take 5 mg by mouth daily.     No facility-administered medications prior  to visit.    PAST MEDICAL HISTORY: Past Medical History  Diagnosis Date  . Mild cognitive impairment   . CLL (chronic lymphocytic leukemia)   . Reflux   . Depression   . Anxiety   . Memory loss     PAST SURGICAL HISTORY: Past Surgical History  Procedure Laterality Date  . Appendectomy    . Abdominal hysterectomy      FAMILY HISTORY: Family History  Problem Relation Age of Onset  . Alzheimer's disease Father     SOCIAL HISTORY: History   Social History  . Marital Status: Married    Spouse Name: [name]    Number of Children: 2  . Years of  Education: college   Occupational History  . Not on file.   Social History Main Topics  . Smoking status: Never Smoker   . Smokeless tobacco: Never Used  . Alcohol Use: Yes     Comment: wine daily   . Drug Use: No  . Sexual Activity: Not on file   Other Topics Concern  . Not on file   Social History Narrative   Patient lives at home with husband [name].    Patient is retired.    Patient has a college education.    Patient has 2 children.    Patient drinks 2 cups of coffee daily.      PHYSICAL EXAM  Filed Vitals:   09/13/14 0933  BP: 136/75  Pulse: 79  Height: 5\' 6"  (1.676 m)  Weight: 172 lb (78.019 kg)   Body mass index is 27.77 kg/(m^2). Generalized: Well developed, in no acute distress  Head: normocephalic and atraumatic,. Oropharynx benign  Neck: Supple, no carotid bruits  Cardiac: Regular rate rhythm, no murmur  Musculoskeletal: No deformity  Neurological examination  Mentation: Alert oriented to time, place, history taking. MMSE 30 /30. AFT 17. Cranial nerve II-XII: Pupils were equal round reactive to light extraocular movements were full, visual field were full on confrontational test. Facial sensation and strength were normal. hearing was intact to finger rubbing bilaterally. Uvula tongue midline. head turning and shoulder shrug and were normal and symmetric.Tongue protrusion into cheek strength was normal.  Motor: normal bulk and tone, full strength in the BUE, BLE, fine finger movements normal, no pronator drift. No focal weakness  Sensory: normal and symmetric to light touch, pinprick, and vibration  Coordination: finger-nose-finger, heel-to-shin bilaterally, no dysmetria  Reflexes: Brachioradialis 2/2, biceps 2/2, triceps 2/2, patellar 2/2, Achilles 2/2, plantar responses were flexor bilaterally.  Gait and Station: Rising up from seated position without assistance, normal stance, without trunk ataxia, moderate stride, good arm swing, smooth turning, able to  perform tiptoe, and heel walking without difficulty. Tandem gait normal . No assistive device   DIAGNOSTIC DATA (LABS, IMAGING, TESTING) - I reviewed patient records, labs, notes, testing and imaging myself where available.  Lab Results  Component Value Date   WBC 13.0* 04/23/2014   HGB 14.4 04/23/2014   HCT 41.6 04/23/2014   MCV 90.2 04/23/2014   PLT 257 04/23/2014     ASSESSMENT AND PLAN  81 y.o. year old female  has a past medical history of Mild cognitive impairment; CLL (chronic lymphocytic leukemia); Reflux; Depression; Anxiety; and Memory loss. here to followup.  Memory score is stable 30 out of 30 Continue moderate exercise Stop Protonix, Decrease Ativan to 0.5 mg as needed every night Refill her Celexa  Return to clinic as needed    Marcial Pacas, M.D. Ph.D.  [facility] [phone]  [place], [place] [address] Phone: [phone] Fax:      [phone]

## 2014-09-27 ENCOUNTER — Other Ambulatory Visit (HOSPITAL_COMMUNITY): Payer: Self-pay | Admitting: Internal Medicine

## 2014-09-27 DIAGNOSIS — Z1231 Encounter for screening mammogram for malignant neoplasm of breast: Secondary | ICD-10-CM

## 2014-09-28 ENCOUNTER — Ambulatory Visit (HOSPITAL_COMMUNITY)
Admission: RE | Admit: 2014-09-28 | Discharge: 2014-09-28 | Disposition: A | Discharge status: Home / Self Care | Payer: Medicare Other | Source: Ambulatory Visit | Attending: Internal Medicine | Admitting: Internal Medicine

## 2014-09-28 DIAGNOSIS — Z1231 Encounter for screening mammogram for malignant neoplasm of breast: Secondary | ICD-10-CM | POA: Diagnosis not present

## 2014-10-26 DIAGNOSIS — M545 Low back pain: Secondary | ICD-10-CM | POA: Diagnosis not present

## 2014-10-26 DIAGNOSIS — M5127 Other intervertebral disc displacement, lumbosacral region: Secondary | ICD-10-CM | POA: Diagnosis not present

## 2014-11-09 DIAGNOSIS — M5137 Other intervertebral disc degeneration, lumbosacral region: Secondary | ICD-10-CM | POA: Diagnosis not present

## 2014-11-16 DIAGNOSIS — M5127 Other intervertebral disc displacement, lumbosacral region: Secondary | ICD-10-CM | POA: Diagnosis not present

## 2014-11-16 DIAGNOSIS — M545 Low back pain: Secondary | ICD-10-CM | POA: Diagnosis not present

## 2014-12-31 DIAGNOSIS — R600 Localized edema: Secondary | ICD-10-CM | POA: Diagnosis not present

## 2014-12-31 DIAGNOSIS — M4806 Spinal stenosis, lumbar region: Secondary | ICD-10-CM | POA: Diagnosis not present

## 2014-12-31 DIAGNOSIS — R197 Diarrhea, unspecified: Secondary | ICD-10-CM | POA: Diagnosis not present

## 2014-12-31 DIAGNOSIS — Z6829 Body mass index (BMI) 29.0-29.9, adult: Secondary | ICD-10-CM | POA: Diagnosis not present

## 2015-01-01 DIAGNOSIS — R600 Localized edema: Secondary | ICD-10-CM | POA: Diagnosis not present

## 2015-01-03 DIAGNOSIS — M5137 Other intervertebral disc degeneration, lumbosacral region: Secondary | ICD-10-CM | POA: Diagnosis not present

## 2015-01-03 DIAGNOSIS — M5127 Other intervertebral disc displacement, lumbosacral region: Secondary | ICD-10-CM | POA: Diagnosis not present

## 2015-02-12 DIAGNOSIS — M5137 Other intervertebral disc degeneration, lumbosacral region: Secondary | ICD-10-CM | POA: Diagnosis not present

## 2015-02-12 DIAGNOSIS — M47816 Spondylosis without myelopathy or radiculopathy, lumbar region: Secondary | ICD-10-CM | POA: Diagnosis not present

## 2015-02-25 DIAGNOSIS — L821 Other seborrheic keratosis: Secondary | ICD-10-CM | POA: Diagnosis not present

## 2015-02-25 DIAGNOSIS — Z85828 Personal history of other malignant neoplasm of skin: Secondary | ICD-10-CM | POA: Diagnosis not present

## 2015-02-25 DIAGNOSIS — D485 Neoplasm of uncertain behavior of skin: Secondary | ICD-10-CM | POA: Diagnosis not present

## 2015-02-25 DIAGNOSIS — L570 Actinic keratosis: Secondary | ICD-10-CM | POA: Diagnosis not present

## 2015-02-28 DIAGNOSIS — M5136 Other intervertebral disc degeneration, lumbar region: Secondary | ICD-10-CM | POA: Diagnosis not present

## 2015-03-06 DIAGNOSIS — M5136 Other intervertebral disc degeneration, lumbar region: Secondary | ICD-10-CM | POA: Diagnosis not present

## 2015-03-12 DIAGNOSIS — M5136 Other intervertebral disc degeneration, lumbar region: Secondary | ICD-10-CM | POA: Diagnosis not present

## 2015-03-13 DIAGNOSIS — H01005 Unspecified blepharitis left lower eyelid: Secondary | ICD-10-CM | POA: Diagnosis not present

## 2015-03-13 DIAGNOSIS — H01002 Unspecified blepharitis right lower eyelid: Secondary | ICD-10-CM | POA: Diagnosis not present

## 2015-03-13 DIAGNOSIS — H01004 Unspecified blepharitis left upper eyelid: Secondary | ICD-10-CM | POA: Diagnosis not present

## 2015-03-13 DIAGNOSIS — H01001 Unspecified blepharitis right upper eyelid: Secondary | ICD-10-CM | POA: Diagnosis not present

## 2015-03-14 DIAGNOSIS — M5136 Other intervertebral disc degeneration, lumbar region: Secondary | ICD-10-CM | POA: Diagnosis not present

## 2015-03-19 DIAGNOSIS — M5136 Other intervertebral disc degeneration, lumbar region: Secondary | ICD-10-CM | POA: Diagnosis not present

## 2015-03-21 DIAGNOSIS — M5136 Other intervertebral disc degeneration, lumbar region: Secondary | ICD-10-CM | POA: Diagnosis not present

## 2015-04-02 DIAGNOSIS — L905 Scar conditions and fibrosis of skin: Secondary | ICD-10-CM | POA: Diagnosis not present

## 2015-04-02 DIAGNOSIS — L570 Actinic keratosis: Secondary | ICD-10-CM | POA: Diagnosis not present

## 2015-04-02 DIAGNOSIS — D485 Neoplasm of uncertain behavior of skin: Secondary | ICD-10-CM | POA: Diagnosis not present

## 2015-04-02 DIAGNOSIS — L821 Other seborrheic keratosis: Secondary | ICD-10-CM | POA: Diagnosis not present

## 2015-04-02 DIAGNOSIS — Z85828 Personal history of other malignant neoplasm of skin: Secondary | ICD-10-CM | POA: Diagnosis not present

## 2015-04-22 DIAGNOSIS — H01004 Unspecified blepharitis left upper eyelid: Secondary | ICD-10-CM | POA: Diagnosis not present

## 2015-04-22 DIAGNOSIS — H43813 Vitreous degeneration, bilateral: Secondary | ICD-10-CM | POA: Diagnosis not present

## 2015-04-22 DIAGNOSIS — H01001 Unspecified blepharitis right upper eyelid: Secondary | ICD-10-CM | POA: Diagnosis not present

## 2015-04-22 DIAGNOSIS — Z961 Presence of intraocular lens: Secondary | ICD-10-CM | POA: Diagnosis not present

## 2015-04-24 DIAGNOSIS — M5137 Other intervertebral disc degeneration, lumbosacral region: Secondary | ICD-10-CM | POA: Diagnosis not present

## 2015-05-07 DIAGNOSIS — M5137 Other intervertebral disc degeneration, lumbosacral region: Secondary | ICD-10-CM | POA: Diagnosis not present

## 2015-05-27 DIAGNOSIS — M5127 Other intervertebral disc displacement, lumbosacral region: Secondary | ICD-10-CM | POA: Diagnosis not present

## 2015-05-27 DIAGNOSIS — M5137 Other intervertebral disc degeneration, lumbosacral region: Secondary | ICD-10-CM | POA: Diagnosis not present

## 2015-06-03 ENCOUNTER — Other Ambulatory Visit: Payer: Self-pay | Admitting: Internal Medicine

## 2015-06-03 DIAGNOSIS — R229 Localized swelling, mass and lump, unspecified: Secondary | ICD-10-CM | POA: Diagnosis not present

## 2015-06-03 DIAGNOSIS — Z23 Encounter for immunization: Secondary | ICD-10-CM | POA: Diagnosis not present

## 2015-06-03 DIAGNOSIS — Z6828 Body mass index (BMI) 28.0-28.9, adult: Secondary | ICD-10-CM | POA: Diagnosis not present

## 2015-06-04 ENCOUNTER — Other Ambulatory Visit: Payer: Self-pay | Admitting: Internal Medicine

## 2015-06-04 DIAGNOSIS — N6320 Unspecified lump in the left breast, unspecified quadrant: Secondary | ICD-10-CM

## 2015-06-12 ENCOUNTER — Ambulatory Visit
Admission: RE | Admit: 2015-06-12 | Discharge: 2015-06-12 | Disposition: A | Discharge status: Home / Self Care | Payer: Medicare Other | Source: Ambulatory Visit | Attending: Internal Medicine | Admitting: Internal Medicine

## 2015-06-12 DIAGNOSIS — N6320 Unspecified lump in the left breast, unspecified quadrant: Secondary | ICD-10-CM

## 2015-06-12 DIAGNOSIS — R928 Other abnormal and inconclusive findings on diagnostic imaging of breast: Secondary | ICD-10-CM | POA: Diagnosis not present

## 2015-06-26 DIAGNOSIS — R7301 Impaired fasting glucose: Secondary | ICD-10-CM | POA: Diagnosis not present

## 2015-06-26 DIAGNOSIS — M859 Disorder of bone density and structure, unspecified: Secondary | ICD-10-CM | POA: Diagnosis not present

## 2015-06-26 DIAGNOSIS — E785 Hyperlipidemia, unspecified: Secondary | ICD-10-CM | POA: Diagnosis not present

## 2015-07-02 DIAGNOSIS — N3946 Mixed incontinence: Secondary | ICD-10-CM | POA: Diagnosis not present

## 2015-07-03 DIAGNOSIS — Z1389 Encounter for screening for other disorder: Secondary | ICD-10-CM | POA: Diagnosis not present

## 2015-07-03 DIAGNOSIS — Z0000 Encounter for general adult medical examination without abnormal findings: Secondary | ICD-10-CM | POA: Diagnosis not present

## 2015-07-03 DIAGNOSIS — Z6828 Body mass index (BMI) 28.0-28.9, adult: Secondary | ICD-10-CM | POA: Diagnosis not present

## 2015-07-03 DIAGNOSIS — Z1231 Encounter for screening mammogram for malignant neoplasm of breast: Secondary | ICD-10-CM | POA: Diagnosis not present

## 2015-07-03 DIAGNOSIS — M859 Disorder of bone density and structure, unspecified: Secondary | ICD-10-CM | POA: Diagnosis not present

## 2015-07-03 DIAGNOSIS — R0600 Dyspnea, unspecified: Secondary | ICD-10-CM | POA: Diagnosis not present

## 2015-07-03 DIAGNOSIS — K219 Gastro-esophageal reflux disease without esophagitis: Secondary | ICD-10-CM | POA: Diagnosis not present

## 2015-07-03 DIAGNOSIS — R413 Other amnesia: Secondary | ICD-10-CM | POA: Diagnosis not present

## 2015-07-03 DIAGNOSIS — R7301 Impaired fasting glucose: Secondary | ICD-10-CM | POA: Diagnosis not present

## 2015-07-03 DIAGNOSIS — M4806 Spinal stenosis, lumbar region: Secondary | ICD-10-CM | POA: Diagnosis not present

## 2015-07-03 DIAGNOSIS — R600 Localized edema: Secondary | ICD-10-CM | POA: Diagnosis not present

## 2015-07-03 DIAGNOSIS — E785 Hyperlipidemia, unspecified: Secondary | ICD-10-CM | POA: Diagnosis not present

## 2015-07-08 DIAGNOSIS — L905 Scar conditions and fibrosis of skin: Secondary | ICD-10-CM | POA: Diagnosis not present

## 2015-07-08 DIAGNOSIS — M21622 Bunionette of left foot: Secondary | ICD-10-CM | POA: Diagnosis not present

## 2015-07-08 DIAGNOSIS — G5762 Lesion of plantar nerve, left lower limb: Secondary | ICD-10-CM | POA: Diagnosis not present

## 2015-07-19 DIAGNOSIS — N390 Urinary tract infection, site not specified: Secondary | ICD-10-CM | POA: Diagnosis not present

## 2015-07-20 ENCOUNTER — Other Ambulatory Visit: Payer: Self-pay | Admitting: Neurology

## 2015-07-22 DIAGNOSIS — G5762 Lesion of plantar nerve, left lower limb: Secondary | ICD-10-CM | POA: Diagnosis not present

## 2015-07-22 DIAGNOSIS — M19072 Primary osteoarthritis, left ankle and foot: Secondary | ICD-10-CM | POA: Diagnosis not present

## 2015-07-22 DIAGNOSIS — M5416 Radiculopathy, lumbar region: Secondary | ICD-10-CM | POA: Diagnosis not present

## 2015-07-22 DIAGNOSIS — G5752 Tarsal tunnel syndrome, left lower limb: Secondary | ICD-10-CM | POA: Diagnosis not present

## 2015-07-22 NOTE — Telephone Encounter (Signed)
error 

## 2015-07-30 DIAGNOSIS — N390 Urinary tract infection, site not specified: Secondary | ICD-10-CM | POA: Diagnosis not present

## 2015-07-31 DIAGNOSIS — N390 Urinary tract infection, site not specified: Secondary | ICD-10-CM | POA: Diagnosis not present

## 2015-08-05 ENCOUNTER — Telehealth: Payer: Self-pay | Admitting: Neurology

## 2015-08-05 NOTE — Telephone Encounter (Addendum)
Scheduled 87yr f/u for 09/16/15. Ok per Dr Krista Blue to f/u w/ NP. Knows to check in 945am. EK 12/5 Scheduled w. Cecille Rubin. Pt verbalized understanding.

## 2015-08-05 NOTE — Telephone Encounter (Signed)
Patient is calling to see if she needs a yearly appointment to have her med renewed.  He last appointment was in January '16.  She states she is having no problems right now. Please call.

## 2015-08-06 ENCOUNTER — Other Ambulatory Visit: Payer: Self-pay

## 2015-08-06 DIAGNOSIS — Z1231 Encounter for screening mammogram for malignant neoplasm of breast: Secondary | ICD-10-CM

## 2015-08-09 DIAGNOSIS — R3915 Urgency of urination: Secondary | ICD-10-CM | POA: Diagnosis not present

## 2015-08-09 DIAGNOSIS — N3946 Mixed incontinence: Secondary | ICD-10-CM | POA: Diagnosis not present

## 2015-08-09 DIAGNOSIS — R300 Dysuria: Secondary | ICD-10-CM | POA: Diagnosis not present

## 2015-08-09 DIAGNOSIS — R3989 Other symptoms and signs involving the genitourinary system: Secondary | ICD-10-CM | POA: Diagnosis not present

## 2015-08-19 DIAGNOSIS — N3946 Mixed incontinence: Secondary | ICD-10-CM | POA: Diagnosis not present

## 2015-08-19 DIAGNOSIS — R300 Dysuria: Secondary | ICD-10-CM | POA: Diagnosis not present

## 2015-08-19 DIAGNOSIS — R3915 Urgency of urination: Secondary | ICD-10-CM | POA: Diagnosis not present

## 2015-08-19 DIAGNOSIS — R350 Frequency of micturition: Secondary | ICD-10-CM | POA: Diagnosis not present

## 2015-08-29 DIAGNOSIS — M47816 Spondylosis without myelopathy or radiculopathy, lumbar region: Secondary | ICD-10-CM | POA: Diagnosis not present

## 2015-08-29 DIAGNOSIS — M5137 Other intervertebral disc degeneration, lumbosacral region: Secondary | ICD-10-CM | POA: Diagnosis not present

## 2015-09-16 ENCOUNTER — Ambulatory Visit (INDEPENDENT_AMBULATORY_CARE_PROVIDER_SITE_OTHER): Payer: Medicare Other | Admitting: Nurse Practitioner

## 2015-09-16 ENCOUNTER — Ambulatory Visit: Payer: Self-pay | Admitting: Nurse Practitioner

## 2015-09-16 ENCOUNTER — Encounter: Payer: Self-pay | Admitting: Nurse Practitioner

## 2015-09-16 VITALS — BP 139/79 | HR 77 | Ht 66.0 in | Wt 175.2 lb

## 2015-09-16 DIAGNOSIS — F411 Generalized anxiety disorder: Secondary | ICD-10-CM | POA: Diagnosis not present

## 2015-09-16 DIAGNOSIS — R413 Other amnesia: Secondary | ICD-10-CM

## 2015-09-16 MED ORDER — CITALOPRAM HYDROBROMIDE 10 MG PO TABS: 10.0000 mg | ORAL_TABLET | Freq: Every day | ORAL | Status: DC

## 2015-09-16 NOTE — Patient Instructions (Signed)
Memory score is stable 30 out of 30 Continue moderate exercise Refill Celexa for 1 year Stay well-hydrated Follow-up in one year

## 2015-09-16 NOTE — Progress Notes (Signed)
I have reviewed and agreed above plan. 

## 2015-09-16 NOTE — Progress Notes (Signed)
[facility]  PATIENT: Brenda [name] DOB: September 27, 1931   REASON FOR VISIT: Follow-up for memory loss, anxiety HISTORY FROM: Patient    HISTORY OF PRESENT ILLNESS:Ms Brenda [name], 82 yr old right-handed Caucasian female,alone at today's visit returns for followup. She was last seen 09/13/14 by Dr. Krista [name]. She continues to have a lot of anxiety and stress, she is unable to make quick decisions. She remains on Celexa. She continues to walk  some, has some back issues and sees Dr. Tonita [name].  She is active socially and does a lot of reading. She is independent in all activities of daily living. She continues to drive without difficulty. She has not wanted repeat neuropsych testing. Normal MRI of the brain and normal dementia labs. She returns for reevaluation   HISTORY: She is referred by her primary care physician for evaluation of short-term memory trouble. Initial evaluation June 2012  She began to notice gradual onset memory trouble since 2008, most of by herself, not by her family members. She noticed she has to concentrate to remember details, she tends to forget people's names difficult to come up with words sometimes, but she noticed similar problem in her husband and in her friends.   She tends to read a lot, history books, she has no difficulty keeping up with her reading, she is also socially active, she denied trouble handling her finances, or driving. Her father had Alzheimers disease. She has a hx of depression. She is independent with ADL's and driving. She lives at [employer] some of the time. No other changes.   Neuropsych testing by Dr. Valentina [name] in 02/01/12 showed probable mild cognitive impairment -amnestic type as a cause of her memory retrieval. He recommends repeat testing in July 2014.   She restarted low dose Celexa in June 2013 after complaints of anxiety and mild depression. She says her memory is the same. She continues to drive without difficulty.   UPDATE Jan 14th  2016:YY She exercise regularly, reads well, still drive without difficulties,   REVIEW OF SYSTEMS: Full 14 system review of systems performed and notable only for those listed, all others are neg:  Constitutional: neg  Cardiovascular: neg Ear/Nose/Throat:  hearing loss Skin: neg Eyes: neg Respiratory Genitourinary Urgencyl Hematology /Lymphatic:  easy bruising Endocrine: neg Musculoskeletal:neg Allergy/Immunology: neg NeurologicMemory loss  PsychiatricDepression Sleep : neg   ALLERGIES: Allergies  Allergen Reactions  . Adhesive [Tape]   . Codeine   . Sulfa Antibiotics     HOME MEDICATIONS: Outpatient Prescriptions Prior to Visit  Medication Sig Dispense Refill  . atorvastatin (LIPITOR) 20 MG tablet Take 20 mg by mouth at bedtime.    . Calcium Carbonate-Vitamin D (CALCIUM + D PO) Take 10 mg by mouth daily as needed.     . cholecalciferol (VITAMIN D) 1000 UNITS tablet Take 1,000 Units by mouth daily as needed.     . citalopram (CELEXA) 10 MG tablet TAKE 1 TABLET ONCE DAILY. 90 tablet 0  . estradiol (ESTRING) 2 MG vaginal ring Place 2 mg vaginally every 3 (three) months. follow package directions    . LORazepam (ATIVAN) 0.5 MG tablet Take 0.5 mg by mouth at bedtime.     . Multiple Vitamin (MULTIVITAMIN) capsule Take 1 capsule by mouth daily.    . pantoprazole (PROTONIX) 40 MG tablet Take 40 mg by mouth daily.     . vitamin C (ASCORBIC ACID) 500 MG tablet Take 500 mg by mouth daily.    Marland Kitchen ZETIA 10 MG tablet Take  10 mg by mouth at bedtime.     Marland Kitchen zolpidem (AMBIEN) 5 MG tablet Take 5 mg by mouth daily.     No facility-administered medications prior to visit.    PAST MEDICAL HISTORY: Past Medical History  Diagnosis Date  . Mild cognitive impairment   . CLL (chronic lymphocytic leukemia) ([place])   . Reflux   . Depression   . Anxiety   . Memory loss     PAST SURGICAL HISTORY: Past Surgical History  Procedure Laterality Date  . Appendectomy    . Abdominal hysterectomy       FAMILY HISTORY: Family History  Problem Relation Age of Onset  . Alzheimer's disease Father     SOCIAL HISTORY: Social History   Social History  . Marital Status: Married    Spouse Name: Brenda [name]  . Number of Children: 2  . Years of Education: college   Occupational History  . Not on file.   Social History Main Topics  . Smoking status: Never Smoker   . Smokeless tobacco: Never Used  . Alcohol Use: Yes     Comment: wine daily   . Drug Use: No  . Sexual Activity: Not on file   Other Topics Concern  . Not on file   Social History Narrative   Patient lives at home with husband Brenda [name].    Patient is retired.    Patient has a college education.    Patient has 2 children.    Patient drinks 2 cups of coffee daily.      PHYSICAL EXAM  Filed Vitals:   09/16/15 0949  Height: 5\' 6"  (1.676 m)  Weight: 175 lb 3.2 oz (79.47 kg)   Body mass index is 28.29 kg/(m^2). Generalized: Well developed, in no acute distress  Head: normocephalic and atraumatic,. Oropharynx benign  Neck: Supple, no carotid bruits  Cardiac: Regular rate rhythm, no murmur  Musculoskeletal: No deformity  Neurological examination  Mentation: Alert oriented to time, place, history taking. MMSE 30 /30. AFT 11.Clock drawing 4/4 Cranial nerve II-XII: Pupils were equal round reactive to light extraocular movements were full, visual field were full on confrontational test. Facial sensation and strength were normal. hearing was intact to finger rubbing bilaterally. Uvula tongue midline. head turning and shoulder shrug and were normal and symmetric.Tongue protrusion into cheek strength was normal.  Motor: normal bulk and tone, full strength in the BUE, BLE, fine finger movements normal, no pronator drift. No focal weakness  Sensory: normal and symmetric to light touch, pinprick, and vibration  Coordination: finger-nose-finger, heel-to-shin bilaterally, no dysmetria  Reflexes: Brachioradialis 2/2,  biceps 2/2, triceps 2/2, patellar 2/2, Achilles 2/2, plantar responses were flexor bilaterally.  Gait and Station: Rising up from seated position without assistance, normal stance, without trunk ataxia, moderate stride, good arm swing, smooth turning, able to perform tiptoe, and heel walking without difficulty. Tandem gait normal . No assistive device DIAGNOSTIC DATA (LABS, IMAGING, TESTING) -  ASSESSMENT AND PLAN 82 y.o. year old female  has a past medical history of Mild cognitive impairment; CLL (chronic lymphocytic leukemia);  Depression; Anxiety; and Memory loss. here to followup.  Memory score is stable 30 out of 30 AFT 11 Continue moderate exercise Refill her Celexa Stay well-hydrated  Return to clinic in 1 year  Dennie Bible, [facility], [facility], [facility] [address] [place], [place] [address] [phone]

## 2015-09-30 ENCOUNTER — Ambulatory Visit
Admission: RE | Admit: 2015-09-30 | Discharge: 2015-09-30 | Disposition: A | Discharge status: Home / Self Care | Payer: Medicare Other | Source: Ambulatory Visit

## 2015-09-30 DIAGNOSIS — Z1231 Encounter for screening mammogram for malignant neoplasm of breast: Secondary | ICD-10-CM | POA: Diagnosis not present

## 2015-10-21 ENCOUNTER — Telehealth: Payer: Self-pay | Admitting: Neurology

## 2015-10-21 NOTE — Telephone Encounter (Signed)
Toni/[facility] called to request refill of citalopram (CELEXA) 10 MG tablet.

## 2015-10-21 NOTE — Telephone Encounter (Signed)
Ok per Dr. Krista Blue to call in a 30 day supply to pharmacy below (patient is on vacation without her medication).

## 2015-10-28 DIAGNOSIS — D045 Carcinoma in situ of skin of trunk: Secondary | ICD-10-CM | POA: Diagnosis not present

## 2015-10-28 DIAGNOSIS — L821 Other seborrheic keratosis: Secondary | ICD-10-CM | POA: Diagnosis not present

## 2015-10-28 DIAGNOSIS — D485 Neoplasm of uncertain behavior of skin: Secondary | ICD-10-CM | POA: Diagnosis not present

## 2015-10-28 DIAGNOSIS — Z85828 Personal history of other malignant neoplasm of skin: Secondary | ICD-10-CM | POA: Diagnosis not present

## 2015-10-28 DIAGNOSIS — D1801 Hemangioma of skin and subcutaneous tissue: Secondary | ICD-10-CM | POA: Diagnosis not present

## 2015-10-28 DIAGNOSIS — L570 Actinic keratosis: Secondary | ICD-10-CM | POA: Diagnosis not present

## 2015-11-27 DIAGNOSIS — N3942 Incontinence without sensory awareness: Secondary | ICD-10-CM | POA: Diagnosis not present

## 2015-11-27 DIAGNOSIS — R300 Dysuria: Secondary | ICD-10-CM | POA: Diagnosis not present

## 2015-11-27 DIAGNOSIS — Z0000 Encounter for general adult medical examination without abnormal findings: Secondary | ICD-10-CM | POA: Diagnosis not present

## 2015-11-27 DIAGNOSIS — N952 Postmenopausal atrophic vaginitis: Secondary | ICD-10-CM | POA: Diagnosis not present

## 2016-01-08 DIAGNOSIS — H01006 Unspecified blepharitis left eye, unspecified eyelid: Secondary | ICD-10-CM | POA: Diagnosis not present

## 2016-01-08 DIAGNOSIS — T7840XA Allergy, unspecified, initial encounter: Secondary | ICD-10-CM | POA: Diagnosis not present

## 2016-01-08 DIAGNOSIS — H01003 Unspecified blepharitis right eye, unspecified eyelid: Secondary | ICD-10-CM | POA: Diagnosis not present

## 2016-01-15 DIAGNOSIS — L980 Pyogenic granuloma: Secondary | ICD-10-CM | POA: Diagnosis not present

## 2016-01-15 DIAGNOSIS — H01003 Unspecified blepharitis right eye, unspecified eyelid: Secondary | ICD-10-CM | POA: Diagnosis not present

## 2016-01-15 DIAGNOSIS — H01006 Unspecified blepharitis left eye, unspecified eyelid: Secondary | ICD-10-CM | POA: Diagnosis not present

## 2016-01-23 DIAGNOSIS — H0015 Chalazion left lower eyelid: Secondary | ICD-10-CM | POA: Diagnosis not present

## 2016-01-23 DIAGNOSIS — H04123 Dry eye syndrome of bilateral lacrimal glands: Secondary | ICD-10-CM | POA: Diagnosis not present

## 2016-01-28 DIAGNOSIS — M1711 Unilateral primary osteoarthritis, right knee: Secondary | ICD-10-CM | POA: Diagnosis not present

## 2016-01-28 DIAGNOSIS — M25561 Pain in right knee: Secondary | ICD-10-CM | POA: Diagnosis not present

## 2016-02-25 DIAGNOSIS — H01021 Squamous blepharitis right upper eyelid: Secondary | ICD-10-CM | POA: Diagnosis not present

## 2016-03-02 DIAGNOSIS — H01004 Unspecified blepharitis left upper eyelid: Secondary | ICD-10-CM | POA: Diagnosis not present

## 2016-03-02 DIAGNOSIS — H02052 Trichiasis without entropian right lower eyelid: Secondary | ICD-10-CM | POA: Diagnosis not present

## 2016-03-02 DIAGNOSIS — H0015 Chalazion left lower eyelid: Secondary | ICD-10-CM | POA: Diagnosis not present

## 2016-03-02 DIAGNOSIS — H02051 Trichiasis without entropian right upper eyelid: Secondary | ICD-10-CM | POA: Diagnosis not present

## 2016-03-05 DIAGNOSIS — Z85828 Personal history of other malignant neoplasm of skin: Secondary | ICD-10-CM | POA: Diagnosis not present

## 2016-03-05 DIAGNOSIS — L720 Epidermal cyst: Secondary | ICD-10-CM | POA: Diagnosis not present

## 2016-03-12 DIAGNOSIS — M1711 Unilateral primary osteoarthritis, right knee: Secondary | ICD-10-CM | POA: Diagnosis not present

## 2016-03-19 DIAGNOSIS — H04123 Dry eye syndrome of bilateral lacrimal glands: Secondary | ICD-10-CM | POA: Diagnosis not present

## 2016-03-19 DIAGNOSIS — H0015 Chalazion left lower eyelid: Secondary | ICD-10-CM | POA: Diagnosis not present

## 2016-03-19 DIAGNOSIS — H1033 Unspecified acute conjunctivitis, bilateral: Secondary | ICD-10-CM | POA: Diagnosis not present

## 2016-03-19 DIAGNOSIS — H01001 Unspecified blepharitis right upper eyelid: Secondary | ICD-10-CM | POA: Diagnosis not present

## 2016-03-23 DIAGNOSIS — H01005 Unspecified blepharitis left lower eyelid: Secondary | ICD-10-CM | POA: Diagnosis not present

## 2016-03-23 DIAGNOSIS — H01001 Unspecified blepharitis right upper eyelid: Secondary | ICD-10-CM | POA: Diagnosis not present

## 2016-03-23 DIAGNOSIS — H01002 Unspecified blepharitis right lower eyelid: Secondary | ICD-10-CM | POA: Diagnosis not present

## 2016-03-23 DIAGNOSIS — H01004 Unspecified blepharitis left upper eyelid: Secondary | ICD-10-CM | POA: Diagnosis not present

## 2016-03-25 DIAGNOSIS — Z85828 Personal history of other malignant neoplasm of skin: Secondary | ICD-10-CM | POA: Diagnosis not present

## 2016-03-25 DIAGNOSIS — L245 Irritant contact dermatitis due to other chemical products: Secondary | ICD-10-CM | POA: Diagnosis not present

## 2016-04-14 DIAGNOSIS — M1711 Unilateral primary osteoarthritis, right knee: Secondary | ICD-10-CM | POA: Diagnosis not present

## 2016-04-15 DIAGNOSIS — H0015 Chalazion left lower eyelid: Secondary | ICD-10-CM | POA: Diagnosis not present

## 2016-04-15 DIAGNOSIS — H01001 Unspecified blepharitis right upper eyelid: Secondary | ICD-10-CM | POA: Diagnosis not present

## 2016-04-15 DIAGNOSIS — H01004 Unspecified blepharitis left upper eyelid: Secondary | ICD-10-CM | POA: Diagnosis not present

## 2016-04-15 DIAGNOSIS — H04123 Dry eye syndrome of bilateral lacrimal glands: Secondary | ICD-10-CM | POA: Diagnosis not present

## 2016-04-21 DIAGNOSIS — M1711 Unilateral primary osteoarthritis, right knee: Secondary | ICD-10-CM | POA: Diagnosis not present

## 2016-04-28 DIAGNOSIS — M1711 Unilateral primary osteoarthritis, right knee: Secondary | ICD-10-CM | POA: Diagnosis not present

## 2016-04-29 DIAGNOSIS — Z6829 Body mass index (BMI) 29.0-29.9, adult: Secondary | ICD-10-CM | POA: Diagnosis not present

## 2016-04-29 DIAGNOSIS — R05 Cough: Secondary | ICD-10-CM | POA: Diagnosis not present

## 2016-05-06 DIAGNOSIS — H02875 Vascular anomalies of left lower eyelid: Secondary | ICD-10-CM | POA: Diagnosis not present

## 2016-05-06 DIAGNOSIS — Z885 Allergy status to narcotic agent status: Secondary | ICD-10-CM | POA: Diagnosis not present

## 2016-05-06 DIAGNOSIS — Z85828 Personal history of other malignant neoplasm of skin: Secondary | ICD-10-CM | POA: Diagnosis not present

## 2016-05-06 DIAGNOSIS — H01021 Squamous blepharitis right upper eyelid: Secondary | ICD-10-CM | POA: Diagnosis not present

## 2016-05-06 DIAGNOSIS — H01022 Squamous blepharitis right lower eyelid: Secondary | ICD-10-CM | POA: Diagnosis not present

## 2016-05-06 DIAGNOSIS — Z882 Allergy status to sulfonamides status: Secondary | ICD-10-CM | POA: Diagnosis not present

## 2016-05-06 DIAGNOSIS — H01024 Squamous blepharitis left upper eyelid: Secondary | ICD-10-CM | POA: Diagnosis not present

## 2016-05-06 DIAGNOSIS — H01025 Squamous blepharitis left lower eyelid: Secondary | ICD-10-CM | POA: Diagnosis not present

## 2016-05-06 DIAGNOSIS — Z9889 Other specified postprocedural states: Secondary | ICD-10-CM | POA: Diagnosis not present

## 2016-05-06 DIAGNOSIS — H02403 Unspecified ptosis of bilateral eyelids: Secondary | ICD-10-CM | POA: Diagnosis not present

## 2016-05-12 DIAGNOSIS — Z6829 Body mass index (BMI) 29.0-29.9, adult: Secondary | ICD-10-CM | POA: Diagnosis not present

## 2016-05-12 DIAGNOSIS — M25569 Pain in unspecified knee: Secondary | ICD-10-CM | POA: Diagnosis not present

## 2016-05-12 DIAGNOSIS — E784 Other hyperlipidemia: Secondary | ICD-10-CM | POA: Diagnosis not present

## 2016-05-12 DIAGNOSIS — R0602 Shortness of breath: Secondary | ICD-10-CM | POA: Diagnosis not present

## 2016-05-14 DIAGNOSIS — H019 Unspecified inflammation of eyelid: Secondary | ICD-10-CM | POA: Diagnosis not present

## 2016-05-14 DIAGNOSIS — H018 Other specified inflammations of eyelid: Secondary | ICD-10-CM | POA: Diagnosis not present

## 2016-05-14 DIAGNOSIS — H02875 Vascular anomalies of left lower eyelid: Secondary | ICD-10-CM | POA: Diagnosis not present

## 2016-05-14 DIAGNOSIS — D210 Benign neoplasm of connective and other soft tissue of head, face and neck: Secondary | ICD-10-CM | POA: Diagnosis not present

## 2016-05-21 ENCOUNTER — Telehealth: Payer: Self-pay | Admitting: Cardiovascular Disease

## 2016-05-21 NOTE — Telephone Encounter (Signed)
Received records from [facility] for appointment on 06/08/16 with Dr Sallyanne Kuster.  Records given to [facility] (medical records) for Dr Croitoru's schedule on 06/08/16. lp

## 2016-05-27 DIAGNOSIS — Z881 Allergy status to other antibiotic agents status: Secondary | ICD-10-CM | POA: Diagnosis not present

## 2016-05-27 DIAGNOSIS — H01022 Squamous blepharitis right lower eyelid: Secondary | ICD-10-CM | POA: Diagnosis not present

## 2016-05-27 DIAGNOSIS — Z882 Allergy status to sulfonamides status: Secondary | ICD-10-CM | POA: Diagnosis not present

## 2016-05-27 DIAGNOSIS — H01024 Squamous blepharitis left upper eyelid: Secondary | ICD-10-CM | POA: Diagnosis not present

## 2016-05-27 DIAGNOSIS — Z885 Allergy status to narcotic agent status: Secondary | ICD-10-CM | POA: Diagnosis not present

## 2016-05-27 DIAGNOSIS — Z9842 Cataract extraction status, left eye: Secondary | ICD-10-CM | POA: Diagnosis not present

## 2016-05-27 DIAGNOSIS — Z9889 Other specified postprocedural states: Secondary | ICD-10-CM | POA: Diagnosis not present

## 2016-05-27 DIAGNOSIS — Z9841 Cataract extraction status, right eye: Secondary | ICD-10-CM | POA: Diagnosis not present

## 2016-05-27 DIAGNOSIS — H01021 Squamous blepharitis right upper eyelid: Secondary | ICD-10-CM | POA: Diagnosis not present

## 2016-05-27 DIAGNOSIS — H01025 Squamous blepharitis left lower eyelid: Secondary | ICD-10-CM | POA: Diagnosis not present

## 2016-05-30 DIAGNOSIS — Z23 Encounter for immunization: Secondary | ICD-10-CM | POA: Diagnosis not present

## 2016-06-02 DIAGNOSIS — D485 Neoplasm of uncertain behavior of skin: Secondary | ICD-10-CM | POA: Diagnosis not present

## 2016-06-02 DIAGNOSIS — Z85828 Personal history of other malignant neoplasm of skin: Secondary | ICD-10-CM | POA: Diagnosis not present

## 2016-06-02 DIAGNOSIS — C44722 Squamous cell carcinoma of skin of right lower limb, including hip: Secondary | ICD-10-CM | POA: Diagnosis not present

## 2016-06-02 DIAGNOSIS — M1711 Unilateral primary osteoarthritis, right knee: Secondary | ICD-10-CM | POA: Diagnosis not present

## 2016-06-05 DIAGNOSIS — S83241A Other tear of medial meniscus, current injury, right knee, initial encounter: Secondary | ICD-10-CM | POA: Diagnosis not present

## 2016-06-05 DIAGNOSIS — M1711 Unilateral primary osteoarthritis, right knee: Secondary | ICD-10-CM | POA: Diagnosis not present

## 2016-06-08 ENCOUNTER — Ambulatory Visit (INDEPENDENT_AMBULATORY_CARE_PROVIDER_SITE_OTHER): Payer: Medicare Other | Admitting: Cardiovascular Disease

## 2016-06-08 ENCOUNTER — Encounter: Payer: Self-pay | Admitting: Cardiovascular Disease

## 2016-06-08 VITALS — BP 127/76 | HR 84 | Ht 66.0 in | Wt 173.0 lb

## 2016-06-08 DIAGNOSIS — R0602 Shortness of breath: Secondary | ICD-10-CM

## 2016-06-08 DIAGNOSIS — R5383 Other fatigue: Secondary | ICD-10-CM

## 2016-06-08 DIAGNOSIS — Z01810 Encounter for preprocedural cardiovascular examination: Secondary | ICD-10-CM | POA: Diagnosis not present

## 2016-06-08 NOTE — Patient Instructions (Signed)
Medication Instructions: Dr Sallyanne Kuster recommends that you continue on your current medications as directed. Please refer to the Current Medication list given to you today.  Labwork: NONE ORDERED  Testing/Procedures: 1. Echocardiogram - Your physician has requested that you have an echocardiogram. Echocardiography is a painless test that uses sound waves to create images of your heart. It provides your doctor with information about the size and shape of your heart and how well your heart's chambers and valves are working. This procedure takes approximately one hour. There are no restrictions for this procedure. This will be performed at our [place], [place] physician has recommended that you wear an event monitor. Event monitors are medical devices that record the heart's electrical activity. Doctors most often [place] these monitors to diagnose arrhythmias. Arrhythmias are problems with the speed or rhythm of the heartbeat. The monitor is a small, portable device. You can wear one while you do your normal daily activities. This is usually used to diagnose what is causing palpitations/syncope (passing out). Please schedule these on the same day to be done at [facility].  Follow-up: Dr Sallyanne Kuster recommends that you schedule a follow-up appointment in 6 weeks.  If you need a refill on your cardiac medications before your next appointment, please call your pharmacy.

## 2016-06-08 NOTE — Progress Notes (Signed)
Cardiology Consultation Note    Date:  06/09/2016   ID:  Brenda [name], DOB 01-Aug-1932, MRN [id number]  PCP:  Jerlyn Ly, MD  Cardiologist:   Sanda Klein, MD  Reason for consultation: Dyspnea at rest, fatigue Chief Complaint  Patient presents with  . New Evaluation    pt to have knee surgery , c/o sob at times not with exertion  . Shortness of Breath    History of Present Illness:  Brenda [name] is a 82 y.o. female presenting with complaints of unexpected shortness of breath and fatigue.  This is apparently a complaint that has been occurring off and on for many years. Her shortness of breath occurs unpredictably and always occurs at rest. For example she has noticed that when standing without moving in her kitchen preparing food. Before injuring her knee she was able to walk 7 days a week and did not have shortness of breath during walking. She has also noticed that every week when she tries to sing hymns in church she feels short of breath for the first couple of verses, after which her breathing improves.  Independently of the shortness of breath she describes unexplained sudden fatigue. She does not have dizziness or presyncope, but feels sapped of energy.   She usually walks daily, but currently has severe right meniscal tear and is planning to undergo right knee arthroscopic surgery with Dr. Tonita Cong.  She has previously seen cardiology in consultation in 2006 and again in 2012-13 at [facility] with negative echocardiogram and nuclear stress test. I don't have those records at this time (in storage) but will request them. She wore a rhythm monitor in the past, but does not remember whether or not she had symptoms during that time. We discussed the right and left heart catheterization as a final method to clarify possible cardiac causes of dyspnea.  She has gastroesophageal reflux disease that is well controlled, but if she forgets to take her proton pump inhibitor she immediately  developed symptoms. She also complains of a recent cough that she treats the doxycycline. She denies hoarseness or wheezing and does not have orthopnea, PND, hemoptysis, leg edema, claudication, focal neurological complaints or major weight changes. She describes having memory deficits, but seems to be doing quite well with her memory today.  Past Medical History:  Diagnosis Date  . Anxiety   . CLL (chronic lymphocytic leukemia) ([place])   . Depression   . Depression with anxiety   . Diverticulosis   . GERD (gastroesophageal reflux disease)   . Hearing loss   . Memory loss   . Mild cognitive impairment   . Osteopenia   . Reflux   . SOB (shortness of breath)     Past Surgical History:  Procedure Laterality Date  . ABDOMINAL HYSTERECTOMY    . APPENDECTOMY    . CATARACT EXTRACTION Bilateral     Current Medications: Outpatient Medications Prior to Visit  Medication Sig Dispense Refill  . citalopram (CELEXA) 10 MG tablet Take 1 tablet (10 mg total) by mouth daily. 30 tablet 11  . estradiol (ESTRING) 2 MG vaginal ring Place 2 mg vaginally every 3 (three) months. follow package directions    . LORazepam (ATIVAN) 0.5 MG tablet Take 0.5 mg by mouth at bedtime.     . pantoprazole (PROTONIX) 40 MG tablet Take 40 mg by mouth daily.     . vitamin C (ASCORBIC ACID) 500 MG tablet Take 500 mg by mouth daily.    Marland Kitchen  ZETIA 10 MG tablet Take 10 mg by mouth at bedtime.     Marland Kitchen zolpidem (AMBIEN) 5 MG tablet Take 5 mg by mouth daily.    Marland Kitchen atorvastatin (LIPITOR) 20 MG tablet Take 20 mg by mouth at bedtime.    . Calcium Carbonate-Vitamin D (CALCIUM + D PO) Take 10 mg by mouth daily as needed.     . cholecalciferol (VITAMIN D) 1000 UNITS tablet Take 1,000 Units by mouth daily as needed.     . Multiple Vitamin (MULTIVITAMIN) capsule Take 1 capsule by mouth daily.     No facility-administered medications prior to visit.      Allergies:   Adhesive [tape]; Codeine; Doxycycline; and Sulfa antibiotics    Social History   Social History  . Marital status: Married    Spouse name: [name]  . Number of children: 2  . Years of education: college   Social History Main Topics  . Smoking status: Never Smoker  . Smokeless tobacco: Never Used  . Alcohol use Yes     Comment: wine daily   . Drug use: No  . Sexual activity: Not Asked   Other Topics Concern  . None   Social History Narrative   Patient lives at home with husband [name].    Patient is retired.    Patient has a college education.    Patient has 2 children.    Patient drinks 2 cups of coffee daily.      Family History:  The patient's family history includes Alzheimer's disease in her father; Atrial fibrillation in her brother.   ROS:   Please see the history of present illness.    ROS All other systems reviewed and are negative.   PHYSICAL EXAM:   VS:  BP 127/76 (BP Location: Right Arm, Patient Position: Sitting, Cuff Size: Normal)   Pulse 84   Ht 5\' 6"  (1.676 m)   Wt 173 lb (78.5 kg)   SpO2 98%   BMI 27.92 kg/m    GEN: Well nourished, well developed, in no acute distress  HEENT: normal  Neck: no JVD, carotid bruits, or masses Cardiac: RRR; no murmurs, rubs, or gallops,no edema  Respiratory:  clear to auscultation bilaterally, normal work of breathing GI: soft, nontender, nondistended, + BS MS: no deformity or atrophy  Skin: warm and dry, no rash Neuro:  Alert and Oriented x 3, Strength and sensation are intact Psych: euthymic mood, full affect  Wt Readings from Last 3 Encounters:  06/08/16 173 lb (78.5 kg)  09/16/15 175 lb 3.2 oz (79.5 kg)  09/13/14 172 lb (78 kg)      Studies/Labs Reviewed:   EKG:  EKG is ordered today.  The ekg ordered 05/12/16 demonstrates NSR, small R waves in V1-V2, no repolarization abnormalities, QTC 409 ms Recent Labs: September 12 BUN 17, creatinine 0.7, glucose 111, potassium 4.6  Lipid Panel 06/27/2015 total cholesterol 172, triglycerides 137, HDL 46, LDL  99   ASSESSMENT:    1. Shortness of breath   2. Preoperative cardiovascular examination   3. Other fatigue      PLAN:  In order of problems listed above:  1. Suspect dyspnea is not cardiac in etiology, but when we were evaluating this years ago we were considering right and left heart catheterization to finally clarify the etiology of her shortness of breath. I wonder whether this could be insufficiently treated test esophageal reflux disease and silent aspiration. Like to first review her old records and repeat an echocardiogram, before  making additional recommendations. The old records are currently in storage. The unpredictable nature of her symptoms suggest possible arrhythmia and will order an event monitor. Reassess after the echo and event monitor are reviewed as well as her old records are available.. 2. She still has good functional status and does not have structural heart disease by previous evaluation. She denies angina pectoris. She has normal ECG. I think knee surgery would be a low risk proposition without the need for further evaluation before surgery.    Medication Adjustments/Labs and Tests Ordered: Current medicines are reviewed at length with the patient today.  Concerns regarding medicines are outlined above.  Medication changes, Labs and Tests ordered today are listed in the Patient Instructions below. Patient Instructions  Medication Instructions: Dr Sallyanne Kuster recommends that you continue on your current medications as directed. Please refer to the Current Medication list given to you today.  Labwork: NONE ORDERED  Testing/Procedures: 1. Echocardiogram - Your physician has requested that you have an echocardiogram. Echocardiography is a painless test that uses sound waves to create images of your heart. It provides your doctor with information about the size and shape of your heart and how well your heart's chambers and valves are working. This procedure takes  approximately one hour. There are no restrictions for this procedure. This will be performed at our [place], [place] physician has recommended that you wear an event monitor. Event monitors are medical devices that record the heart's electrical activity. Doctors most often [place] these monitors to diagnose arrhythmias. Arrhythmias are problems with the speed or rhythm of the heartbeat. The monitor is a small, portable device. You can wear one while you do your normal daily activities. This is usually used to diagnose what is causing palpitations/syncope (passing out). Please schedule these on the same day to be done at [facility].  Follow-up: Dr Sallyanne Kuster recommends that you schedule a follow-up appointment in 6 weeks.  If you need a refill on your cardiac medications before your next appointment, please call your pharmacy.    Signed, Sanda Klein, MD  06/09/2016 6:34 PM    [address], [place], [place]  [address] Phone: [phone]; Fax: [phone]

## 2016-06-18 ENCOUNTER — Ambulatory Visit (HOSPITAL_COMMUNITY): Payer: Medicare Other | Attending: Cardiovascular Disease

## 2016-06-18 ENCOUNTER — Other Ambulatory Visit: Payer: Self-pay | Admitting: Cardiovascular Disease

## 2016-06-18 ENCOUNTER — Other Ambulatory Visit: Payer: Self-pay

## 2016-06-18 ENCOUNTER — Ambulatory Visit (INDEPENDENT_AMBULATORY_CARE_PROVIDER_SITE_OTHER): Payer: Medicare Other

## 2016-06-18 DIAGNOSIS — R002 Palpitations: Secondary | ICD-10-CM | POA: Diagnosis not present

## 2016-06-18 DIAGNOSIS — R5383 Other fatigue: Secondary | ICD-10-CM

## 2016-06-18 DIAGNOSIS — R0602 Shortness of breath: Secondary | ICD-10-CM | POA: Diagnosis not present

## 2016-06-18 DIAGNOSIS — I499 Cardiac arrhythmia, unspecified: Secondary | ICD-10-CM

## 2016-06-18 DIAGNOSIS — I340 Nonrheumatic mitral (valve) insufficiency: Secondary | ICD-10-CM | POA: Diagnosis not present

## 2016-06-18 DIAGNOSIS — Z01810 Encounter for preprocedural cardiovascular examination: Secondary | ICD-10-CM

## 2016-06-18 DIAGNOSIS — R0600 Dyspnea, unspecified: Secondary | ICD-10-CM | POA: Diagnosis present

## 2016-06-18 DIAGNOSIS — I517 Cardiomegaly: Secondary | ICD-10-CM | POA: Insufficient documentation

## 2016-06-22 ENCOUNTER — Telehealth: Payer: Self-pay | Admitting: Cardiovascular Disease

## 2016-06-22 NOTE — Telephone Encounter (Signed)
Returned call to [facility] who was needing dx codes for completion of patient enrollment. Provided dx, no further needs at this time.

## 2016-06-22 NOTE — Telephone Encounter (Signed)
She would the pt's diagnosis please.

## 2016-06-25 ENCOUNTER — Ambulatory Visit: Payer: Self-pay | Admitting: Orthopedic Surgery

## 2016-06-25 DIAGNOSIS — Z9049 Acquired absence of other specified parts of digestive tract: Secondary | ICD-10-CM | POA: Diagnosis not present

## 2016-06-25 DIAGNOSIS — H01025 Squamous blepharitis left lower eyelid: Secondary | ICD-10-CM | POA: Diagnosis not present

## 2016-06-25 DIAGNOSIS — Z9089 Acquired absence of other organs: Secondary | ICD-10-CM | POA: Diagnosis not present

## 2016-06-25 DIAGNOSIS — Z9841 Cataract extraction status, right eye: Secondary | ICD-10-CM | POA: Diagnosis not present

## 2016-06-25 DIAGNOSIS — H11823 Conjunctivochalasis, bilateral: Secondary | ICD-10-CM | POA: Diagnosis not present

## 2016-06-25 DIAGNOSIS — H01024 Squamous blepharitis left upper eyelid: Secondary | ICD-10-CM | POA: Diagnosis not present

## 2016-06-25 DIAGNOSIS — Z885 Allergy status to narcotic agent status: Secondary | ICD-10-CM | POA: Diagnosis not present

## 2016-06-25 DIAGNOSIS — H01022 Squamous blepharitis right lower eyelid: Secondary | ICD-10-CM | POA: Diagnosis not present

## 2016-06-25 DIAGNOSIS — Z881 Allergy status to other antibiotic agents status: Secondary | ICD-10-CM | POA: Diagnosis not present

## 2016-06-25 DIAGNOSIS — Z91048 Other nonmedicinal substance allergy status: Secondary | ICD-10-CM | POA: Diagnosis not present

## 2016-06-25 DIAGNOSIS — H01021 Squamous blepharitis right upper eyelid: Secondary | ICD-10-CM | POA: Diagnosis not present

## 2016-06-25 DIAGNOSIS — Z888 Allergy status to other drugs, medicaments and biological substances status: Secondary | ICD-10-CM | POA: Diagnosis not present

## 2016-06-25 DIAGNOSIS — Z9842 Cataract extraction status, left eye: Secondary | ICD-10-CM | POA: Diagnosis not present

## 2016-06-25 DIAGNOSIS — Z79899 Other long term (current) drug therapy: Secondary | ICD-10-CM | POA: Diagnosis not present

## 2016-06-25 DIAGNOSIS — Z961 Presence of intraocular lens: Secondary | ICD-10-CM | POA: Diagnosis not present

## 2016-06-25 DIAGNOSIS — Z83518 Family history of other specified eye disorder: Secondary | ICD-10-CM | POA: Diagnosis not present

## 2016-06-25 DIAGNOSIS — Z90710 Acquired absence of both cervix and uterus: Secondary | ICD-10-CM | POA: Diagnosis not present

## 2016-06-25 DIAGNOSIS — Z9889 Other specified postprocedural states: Secondary | ICD-10-CM | POA: Diagnosis not present

## 2016-06-25 NOTE — Progress Notes (Signed)
Surgery on 07/02/16.  preop on 06/26/16 at 0900am.  Need orders in EPIC Thank You

## 2016-06-26 ENCOUNTER — Encounter (HOSPITAL_COMMUNITY)
Admission: RE | Admit: 2016-06-26 | Discharge: 2016-06-26 | Disposition: A | Discharge status: Home / Self Care | Payer: Medicare Other | Source: Ambulatory Visit | Attending: Specialist | Admitting: Specialist

## 2016-06-26 ENCOUNTER — Telehealth: Payer: Self-pay | Admitting: Cardiovascular Disease

## 2016-06-26 ENCOUNTER — Encounter (HOSPITAL_COMMUNITY): Payer: Self-pay

## 2016-06-26 DIAGNOSIS — Z01818 Encounter for other preprocedural examination: Secondary | ICD-10-CM | POA: Insufficient documentation

## 2016-06-26 HISTORY — DX: Adverse effect of unspecified anesthetic, initial encounter: T41.45XA

## 2016-06-26 HISTORY — DX: Other complications of anesthesia, initial encounter: T88.59XA

## 2016-06-26 HISTORY — DX: Unspecified osteoarthritis, unspecified site: M19.90

## 2016-06-26 HISTORY — DX: Dyspnea, unspecified: R06.00

## 2016-06-26 HISTORY — DX: Other specified postprocedural states: Z98.890

## 2016-06-26 HISTORY — DX: Nausea with vomiting, unspecified: R11.2

## 2016-06-26 LAB — CBC
HEMATOCRIT: 42 % (ref 36.0–46.0)
Hemoglobin: 14 g/dL (ref 12.0–15.0)
MCH: 30.4 pg (ref 26.0–34.0)
MCHC: 33.3 g/dL (ref 30.0–36.0)
MCV: 91.1 fL (ref 78.0–100.0)
PLATELETS: 227 10*3/uL (ref 150–400)
RBC: 4.61 MIL/uL (ref 3.87–5.11)
RDW: 13.4 % (ref 11.5–15.5)
WBC: 11.7 10*3/uL — AB (ref 4.0–10.5)

## 2016-06-26 LAB — BASIC METABOLIC PANEL
ANION GAP: 6 (ref 5–15)
BUN: 18 mg/dL (ref 6–20)
CALCIUM: 8.9 mg/dL (ref 8.9–10.3)
CO2: 27 mmol/L (ref 22–32)
Chloride: 106 mmol/L (ref 101–111)
Creatinine, Ser: 0.7 mg/dL (ref 0.44–1.00)
Glucose, Bld: 108 mg/dL — ABNORMAL HIGH (ref 65–99)
Potassium: 4.2 mmol/L (ref 3.5–5.1)
Sodium: 139 mmol/L (ref 135–145)

## 2016-06-26 LAB — ABO/RH: ABO/RH(D): O POS

## 2016-06-26 NOTE — Telephone Encounter (Signed)
msg left w husband (OK per DPR) that patient may leave monitor at home.

## 2016-06-26 NOTE — Pre-Procedure Instructions (Addendum)
Echo 06-18-16 epic EKG 05-12-16 epic Cardiac Clearance Dr. Sallyanne Kuster 06-08-16 on chart Medical Clearance Dr. Joylene Draft 06-09-16 on chart  Pt is currently wearing a 30 day cardiac monitor prescribed by Dr. Sallyanne Kuster with [facility] for her recent shortness of breath.  I spoke with [facility], and they are okay for the pt to discontinue monitor for surgery.  They ask that the pt lets them know in advance.  I have communicated this information to the pt.  Per pt chart, pt had a cardiac event monitor in June of 2006.  Pt failed to mention this, but when asked, she says it  "dimly rings a bell."  Pt also has a history of mild cognitive impairment and memory loss.    Spoke to anesthesia regarding pt's SOB and cardaic clearance.  Instructed to confirm that pt is cleared for surgery with the knowledge of the SOB. Pt visited with cardiology as a new pt 06/08/16 for her shortness of breath.  Dr. Sallyanne Kuster gave cardiac clearance the same day.  Pt had Echo 06/18/16 and Dr. Victorino December notes are in the chart under the ECHO results.  Spoke with Ovid Curd, RN [phone]) at Dr. Victorino December office.  States that it will be okay for pt to discontinue monitor and leave at home.  If any changes are made to these instructions, they will call pt directly.  He also states that there have been no cardiac events since the cardiac events monitor was place on 06/18/16.

## 2016-06-26 NOTE — Telephone Encounter (Signed)
Spoke to caller - Brenda [name]. Pt wearing monitor as of 10/19 - 30 day event monitor. No alerts reported as of today. She is going in on 11/2 for replacement knee surgery and has been previously cleared by Dr. Sallyanne Kuster for this, same day as monitor ordered 10/9. Patient wished to leave monitor at home during surgery for fear of losing it while in the hospital. I advised this should be OK, will verify w Dr. Sallyanne Kuster.

## 2016-06-26 NOTE — Telephone Encounter (Signed)
Yes, that is OK. 

## 2016-06-26 NOTE — Patient Instructions (Addendum)
Brenda [name]  06/26/2016   Your procedure is scheduled on: 07/02/16  Report to [facility]  Entrance take [facility]  elevators to 3rd floor to  [name] at 7:00 AM.  Call this number if you have problems the morning of surgery [phone]   Remember: ONLY 1 PERSON MAY GO WITH YOU TO SHORT STAY TO GET  READY MORNING OF Brenda [name]. Bring in Brenda [name] ([place]) morning of surgery and request to scan in documents if desired.    Do not eat food or drink liquids :After Midnight.     Take these medicines the morning of surgery with A SIP OF WATER:  Citalopram (Celexa), Pantoprazole (Protonix), Eye drops if needed.                               You may not have any metal on your body including hair pins and              piercings  Do not wear jewelry, make-up, lotions, powders or perfumes, deodorant             Do not wear nail polish.  Do not shave  48 hours prior to surgery.              Men may shave face and neck.   Do not bring valuables to the hospital. [place].  Contacts, dentures or bridgework may not be worn into surgery.  Leave suitcase in the car. After surgery it may be brought to your room.     Patients discharged the day of surgery will not be allowed to drive home.  Name and phone number of your driver: Brenda [name] (husband) [phone]              Please read over the following fact sheets you were given: _____________________________________________________________________             [facility] - Preparing for Surgery Before surgery, you can play an important role.  Because skin is not sterile, your skin needs to be as free of germs as possible.  You can reduce the number of germs on your skin by washing with CHG (chlorahexidine gluconate) soap before surgery.  CHG is an antiseptic cleaner which kills germs and bonds with the skin to  continue killing germs even after washing. Please DO NOT use if you have an allergy to CHG or antibacterial soaps.  If your skin becomes reddened/irritated stop using the CHG and inform your nurse when you arrive at Short Stay. Do not shave (including legs and underarms) for at least 48 hours prior to the first CHG shower.  You may shave your face/neck. Please follow these instructions carefully:  1.  Shower with CHG Soap the night before surgery and the  morning of Surgery.  2.  If you choose to wash your hair, wash your hair first as usual with your  normal  shampoo.  3.  After you shampoo, rinse your hair and body thoroughly to remove the  shampoo.                           4.  Use CHG  as you would any other liquid soap.  You can apply chg directly  to the skin and wash                       Gently with a scrungie or clean washcloth.  5.  Apply the CHG Soap to your body ONLY FROM THE NECK DOWN.   Do not use on face/ open                           Wound or open sores. Avoid contact with eyes, ears mouth and genitals (private parts).                       Wash face,  Genitals (private parts) with your normal soap.             6.  Wash thoroughly, paying special attention to the area where your surgery  will be performed.  7.  Thoroughly rinse your body with warm water from the neck down.  8.  DO NOT shower/wash with your normal soap after using and rinsing off  the CHG Soap.                9.  Pat yourself dry with a clean towel.            10.  Wear clean pajamas.            11.  Place clean sheets on your bed the night of your first shower and do not  sleep with pets. Day of Surgery : Do not apply any lotions/deodorants the morning of surgery.  Please wear clean clothes to the hospital/surgery center.  FAILURE TO FOLLOW THESE INSTRUCTIONS MAY RESULT IN THE CANCELLATION OF YOUR SURGERY PATIENT SIGNATURE_________________________________  NURSE  SIGNATURE__________________________________  ________________________________________________________________________   Adam Phenix  An incentive spirometer is a tool that can help keep your lungs clear and active. This tool measures how well you are filling your lungs with each breath. Taking long deep breaths may help reverse or decrease the chance of developing breathing (pulmonary) problems (especially infection) following:  A long period of time when you are unable to move or be active. BEFORE THE PROCEDURE   If the spirometer includes an indicator to show your best effort, your nurse or respiratory therapist will set it to a desired goal.  If possible, sit up straight or lean slightly forward. Try not to slouch.  Hold the incentive spirometer in an upright position. INSTRUCTIONS FOR USE  1. Sit on the edge of your bed if possible, or sit up as far as you can in bed or on a chair. 2. Hold the incentive spirometer in an upright position. 3. Breathe out normally. 4. Place the mouthpiece in your mouth and seal your lips tightly around it. 5. Breathe in slowly and as deeply as possible, raising the piston or the ball toward the top of the column. 6. Hold your breath for 3-5 seconds or for as long as possible. Allow the piston or ball to fall to the bottom of the column. 7. Remove the mouthpiece from your mouth and breathe out normally. 8. Rest for a few seconds and repeat Steps 1 through 7 at least 10 times every 1-2 hours when you are awake. Take your time and take a few normal breaths between deep breaths. 9. The spirometer may include an indicator to show your best effort. Use  the indicator as a goal to work toward during each repetition. 10. After each set of 10 deep breaths, practice coughing to be sure your lungs are clear. If you have an incision (the cut made at the time of surgery), support your incision when coughing by placing a pillow or rolled up towels firmly  against it. Once you are able to get out of bed, walk around indoors and cough well. You may stop using the incentive spirometer when instructed by your caregiver.  RISKS AND COMPLICATIONS  Take your time so you do not get dizzy or light-headed.  If you are in pain, you may need to take or ask for pain medication before doing incentive spirometry. It is harder to take a deep breath if you are having pain. AFTER USE  Rest and breathe slowly and easily.  It can be helpful to keep track of a log of your progress. Your caregiver can provide you with a simple table to help with this. If you are using the spirometer at home, follow these instructions: [place] IF:   You are having difficultly using the spirometer.  You have trouble using the spirometer as often as instructed.  Your pain medication is not giving enough relief while using the spirometer.  You develop fever of 100.5 F (38.1 C) or higher. SEEK IMMEDIATE MEDICAL CARE IF:   You cough up bloody sputum that had not been present before.  You develop fever of 102 F (38.9 C) or greater.  You develop worsening pain at or near the incision site. MAKE SURE YOU:   Understand these instructions.  Will watch your condition.  Will get help right away if you are not doing well or get worse. Document Released: 12/28/2006 Document Revised: 11/09/2011 Document Reviewed: 02/28/2007 ExitCare Patient Information 2014 ExitCare, [place].   ________________________________________________________________________  WHAT IS A BLOOD TRANSFUSION? Blood Transfusion Information  A transfusion is the replacement of blood or some of its parts. Blood is made up of multiple cells which provide different functions.  Red blood cells carry oxygen and are used for blood loss replacement.  White blood cells fight against infection.  Platelets control bleeding.  Plasma helps clot blood.  Other blood products are available for  specialized needs, such as hemophilia or other clotting disorders. BEFORE THE TRANSFUSION  Who gives blood for transfusions?   Healthy volunteers who are fully evaluated to make sure their blood is safe. This is blood bank blood. Transfusion therapy is the safest it has ever been in the practice of medicine. Before blood is taken from a donor, a complete history is taken to make sure that person has no history of diseases nor engages in risky social behavior (examples are intravenous drug use or sexual activity with multiple partners). The donor's travel history is screened to minimize risk of transmitting infections, such as malaria. The donated blood is tested for signs of infectious diseases, such as HIV and hepatitis. The blood is then tested to be sure it is compatible with you in order to minimize the chance of a transfusion reaction. If you or a relative donates blood, this is often done in anticipation of surgery and is not appropriate for emergency situations. It takes many days to process the donated blood. RISKS AND COMPLICATIONS Although transfusion therapy is very safe and saves many lives, the main dangers of transfusion include:   Getting an infectious disease.  Developing a transfusion reaction. This is an allergic reaction to something in the blood  you were given. Every precaution is taken to prevent this. The decision to have a blood transfusion has been considered carefully by your caregiver before blood is given. Blood is not given unless the benefits outweigh the risks. AFTER THE TRANSFUSION  Right after receiving a blood transfusion, you will usually feel much better and more energetic. This is especially true if your red blood cells have gotten low (anemic). The transfusion raises the level of the red blood cells which carry oxygen, and this usually causes an energy increase.  The nurse administering the transfusion will monitor you carefully for complications. HOME CARE  INSTRUCTIONS  No special instructions are needed after a transfusion. You may find your energy is better. Speak with your caregiver about any limitations on activity for underlying diseases you may have. SEEK MEDICAL CARE IF:   Your condition is not improving after your transfusion.  You develop redness or irritation at the intravenous (IV) site. SEEK IMMEDIATE MEDICAL CARE IF:  Any of the following symptoms occur over the next 12 hours:  Shaking chills.  You have a temperature by mouth above 102 F (38.9 C), not controlled by medicine.  Chest, back, or muscle pain.  People around you feel you are not acting correctly or are confused.  Shortness of breath or difficulty breathing.  Dizziness and fainting.  You get a rash or develop hives.  You have a decrease in urine output.  Your urine turns a dark color or changes to pink, red, or brown. Any of the following symptoms occur over the next 10 days:  You have a temperature by mouth above 102 F (38.9 C), not controlled by medicine.  Shortness of breath.  Weakness after normal activity.  The white part of the eye turns yellow (jaundice).  You have a decrease in the amount of urine or are urinating less often.  Your urine turns a dark color or changes to pink, red, or brown. Document Released: 08/14/2000 Document Revised: 11/09/2011 Document Reviewed: 04/02/2008 [facility] Patient Information 2014 [place], [place].  _______________________________________________________________________

## 2016-06-26 NOTE — Telephone Encounter (Signed)
New message       Pt is scheduled to have knee surgery on 07-02-16.  She is wearing an event monitor.  Calling to see if pt can take off monitor during procedure?

## 2016-06-29 ENCOUNTER — Ambulatory Visit: Payer: Self-pay | Admitting: Orthopedic Surgery

## 2016-06-29 NOTE — H&P (Signed)
Brenda [name] is an 82 y.o. female.   Chief Complaint: R knee pain HPI: The patient is a 82 year old female who presents today for follow up of their knee. The patient is being followed for their right knee pain and osteoarthritis. They are now 5 1/2 weeks out from a Euflexxa series. Symptoms reported today include: pain. Current treatment includes: activity modification, NSAIDs and pain medications. The following medication has been used for pain control: antiinflammatory medication and Tylenol. The patient presents today following MRI. The patient has not gotten any relief of their symptoms with viscosupplementation.  Santasia Calbert follows up with her MRI of her knee. She has a complex lateral meniscus tear, medial meniscus tear and fairly severe patellofemoral arthrosis. She has a Child psychotherapist cyst posteriorly and moderate joint effusion. She continues to report lateral joint pain when she is walking and it keeps her from walking, not particularly patellofemoral pain when she goes up and down stairs. No pain posteriorly. She also recently had shortness of breath and has a workup pending for that by her cardiologist.  Past Medical History:  Diagnosis Date  . Anxiety   . Arthritis   . CLL (chronic lymphocytic leukemia) ([place])   . Complication of anesthesia   . Depression   . Depression with anxiety   . Diverticulosis   . Dyspnea   . GERD (gastroesophageal reflux disease)   . Hearing loss   . Memory loss   . Mild cognitive impairment   . Osteopenia   . PONV (postoperative nausea and vomiting)   . Reflux   . SOB (shortness of breath)     Past Surgical History:  Procedure Laterality Date  . ABDOMINAL HYSTERECTOMY    . APPENDECTOMY    . CARDIAC EVENT MONITOR  02/03/2005   30 days, normal event monitor  . CATARACT EXTRACTION Bilateral   . EYE SURGERY     squamous cell carcinoma surgery in right eye lid.  Marland Kitchen NM GATED MYOCARDIAL STUDY (ARMX HX)  01/23/2011   Normal pattern of perfusion in all  regions. post-stress EF is 85%. EKG negative for ischemia. no ECG changes.  . TONSILLECTOMY    . TRANSTHORACIC ECHOCARDIOGRAM  10/31/2010   EF >55%, there is concern for inferoacpical infarct with possible inferoapical aneurysm or pseudoaneurysm - could be echo artificat    Family History  Problem Relation Age of Onset  . Alzheimer's disease Father   . Atrial fibrillation Brother    Social History:  reports that she has never smoked. She has never used smokeless tobacco. She reports that she drinks alcohol. She reports that she does not use drugs.  Allergies:  Allergies  Allergen Reactions  . Adhesive [Tape] Rash  . Codeine Nausea And Vomiting  . Doxycycline Rash    Severe Reflux , cheek "flushing"  . Sulfa Antibiotics Rash     (Not in a hospital admission)  No results found for this or any previous visit (from the past 48 hour(s)). No results found.  Review of Systems  Constitutional: Negative.   HENT: Negative.   Eyes: Negative.   Respiratory: Negative.   Cardiovascular: Negative.   Gastrointestinal: Negative.   Genitourinary: Negative.   Musculoskeletal: Positive for joint pain.  Skin: Negative.   Neurological: Negative.   Psychiatric/Behavioral: Negative.     There were no vitals taken for this visit. Physical Exam  Constitutional: She is oriented to person, place, and time. She appears well-developed and well-nourished.  HENT:  Head: Normocephalic.  Eyes: Pupils  are equal, round, and reactive to light.  Neck: Normal range of motion.  Cardiovascular: Normal rate.   Respiratory: Effort normal.  GI: Soft.  Musculoskeletal:  On exam, exquisitely tender in the lateral joint line. Moderate effusion. Equivocal [place].  Knee exam on inspection reveals no evidence of soft tissue swelling, ecchymosis, deformity or erythema. On palpation there is no tenderness in the medial joint line. No patellofemoral pain with compression. Nontender over the fibular head or the  peroneal nerve. Nontender over the quadriceps insertion of the patellar ligament insertion. The range of motion was full. Provocative maneuvers revealed a negative Lachman, negative anterior and posterior drawer. No instability was noted with varus and valgus stressing at 0 or 30 degrees. On manual motor test the quadriceps and hamstrings were 5/5. Sensory exam was intact to light touch.  Neurological: She is alert and oriented to person, place, and time.  Skin: Skin is warm and dry.    On MRI she has a complex lateral meniscus tear, medial meniscus tear and fairly severe patellofemoral arthrosis. She has a Child psychotherapist cyst posteriorly and moderate joint effusion.  Assessment/Plan 1. Symptomatic medial and lateral meniscus tears of the right knee, refractory to rest, activity modification, home exercises, cortisone, and viscosupplementation. 2. Recent shortness of breath, cardiac evaluation pending.  We had an extensive discussion concerning current pathology, relevant anatomy, and treatment options either living with the symptoms versus consideration of arthroscopic partial meniscectomy and debridement. I do not feel she needs a total knee replacement at this point in time as her pain is mainly lateral where the joint space is fairly well maintained. We discussed the procedure in detail.  I had a long discussion with the patient concerning the risks and benefits of knee arthroscopy including help from the arthroscopic procedure as well as no help from the arthroscopic procedure or worsening of symptoms. Also discussed infection, DVT, PE, anesthetic complications, etc. Also discussed the possibility of repeat arthroscopic surgery required in the future or total knee replacement. I provided the patient with an illustrated handout and discussed it in detail as well as discussed the postoperative and perioperative courses and return to functional activities including work. Need for postoperative DVT prophylaxis  was discussed as well.  Of course, she would require preoperative clearance. She has a cardiac workup pending, last episode of shortness of breath was two weeks ago, not with efforts, standing at the sink, she is not tachycardic without chest pain or shortness of breath today. She had a recent EKG and apparently was unremarkable. No history of DVT or MRSA. Physical therapy postoperatively.  We spent considerable time discussing this.  Plan right knee arthroscopy, debridement, partial medial and lateral meniscectomies  Cecilie Kicks., PA-C for Dr. Tonita Cong 06/29/2016, 10:43 AM

## 2016-07-02 ENCOUNTER — Ambulatory Visit (HOSPITAL_COMMUNITY): Payer: Medicare Other | Admitting: Anesthesiology

## 2016-07-02 ENCOUNTER — Ambulatory Visit (HOSPITAL_COMMUNITY)
Admission: RE | Admit: 2016-07-02 | Discharge: 2016-07-02 | Disposition: A | Discharge status: Home / Self Care | Payer: Medicare Other | Source: Ambulatory Visit | Attending: Specialist | Admitting: Specialist

## 2016-07-02 ENCOUNTER — Encounter (HOSPITAL_COMMUNITY)
Admission: RE | Disposition: A | Discharge status: Home / Self Care | Payer: Self-pay | Source: Ambulatory Visit | Attending: Specialist

## 2016-07-02 ENCOUNTER — Encounter (HOSPITAL_COMMUNITY): Payer: Self-pay

## 2016-07-02 DIAGNOSIS — S83271A Complex tear of lateral meniscus, current injury, right knee, initial encounter: Secondary | ICD-10-CM | POA: Insufficient documentation

## 2016-07-02 DIAGNOSIS — S83241A Other tear of medial meniscus, current injury, right knee, initial encounter: Secondary | ICD-10-CM | POA: Diagnosis not present

## 2016-07-02 DIAGNOSIS — F411 Generalized anxiety disorder: Secondary | ICD-10-CM | POA: Diagnosis not present

## 2016-07-02 DIAGNOSIS — M23303 Other meniscus derangements, unspecified medial meniscus, right knee: Secondary | ICD-10-CM | POA: Diagnosis not present

## 2016-07-02 DIAGNOSIS — F329 Major depressive disorder, single episode, unspecified: Secondary | ICD-10-CM | POA: Insufficient documentation

## 2016-07-02 DIAGNOSIS — F419 Anxiety disorder, unspecified: Secondary | ICD-10-CM | POA: Insufficient documentation

## 2016-07-02 DIAGNOSIS — C9110 Chronic lymphocytic leukemia of B-cell type not having achieved remission: Secondary | ICD-10-CM | POA: Insufficient documentation

## 2016-07-02 DIAGNOSIS — Z79899 Other long term (current) drug therapy: Secondary | ICD-10-CM | POA: Insufficient documentation

## 2016-07-02 DIAGNOSIS — M7121 Synovial cyst of popliteal space [Baker], right knee: Secondary | ICD-10-CM | POA: Diagnosis not present

## 2016-07-02 DIAGNOSIS — Z79890 Hormone replacement therapy (postmenopausal): Secondary | ICD-10-CM | POA: Diagnosis not present

## 2016-07-02 DIAGNOSIS — M2241 Chondromalacia patellae, right knee: Secondary | ICD-10-CM | POA: Insufficient documentation

## 2016-07-02 DIAGNOSIS — M23300 Other meniscus derangements, unspecified lateral meniscus, right knee: Secondary | ICD-10-CM | POA: Diagnosis not present

## 2016-07-02 DIAGNOSIS — S83281A Other tear of lateral meniscus, current injury, right knee, initial encounter: Secondary | ICD-10-CM | POA: Diagnosis not present

## 2016-07-02 DIAGNOSIS — K219 Gastro-esophageal reflux disease without esophagitis: Secondary | ICD-10-CM | POA: Insufficient documentation

## 2016-07-02 DIAGNOSIS — M1711 Unilateral primary osteoarthritis, right knee: Secondary | ICD-10-CM | POA: Diagnosis not present

## 2016-07-02 DIAGNOSIS — X58XXXA Exposure to other specified factors, initial encounter: Secondary | ICD-10-CM | POA: Insufficient documentation

## 2016-07-02 HISTORY — PX: KNEE ARTHROSCOPY: SHX127

## 2016-07-02 LAB — TYPE AND SCREEN
ABO/RH(D): O POS
ANTIBODY SCREEN: NEGATIVE

## 2016-07-02 SURGERY — ARTHROSCOPY, KNEE
Anesthesia: General | Site: Knee | Laterality: Right

## 2016-07-02 MED ORDER — PHENYLEPHRINE 40 MCG/ML (10ML) SYRINGE FOR IV PUSH (FOR BLOOD PRESSURE SUPPORT)
PREFILLED_SYRINGE | INTRAVENOUS | Status: AC
  Filled 2016-07-02: qty 10

## 2016-07-02 MED ORDER — EPHEDRINE SULFATE 50 MG/ML IJ SOLN
INTRAMUSCULAR | Status: DC | PRN
  Administered 2016-07-02: 5 mg via INTRAVENOUS
  Administered 2016-07-02: 10 mg via INTRAVENOUS

## 2016-07-02 MED ORDER — LACTATED RINGERS IR SOLN
Status: DC | PRN
  Administered 2016-07-02: 6000 mL

## 2016-07-02 MED ORDER — HYDROCODONE-ACETAMINOPHEN 5-325 MG PO TABS: 1.0000 | ORAL_TABLET | ORAL | 0 refills | Status: DC | PRN

## 2016-07-02 MED ORDER — FENTANYL CITRATE (PF) 100 MCG/2ML IJ SOLN
INTRAMUSCULAR | Status: AC
  Filled 2016-07-02: qty 2

## 2016-07-02 MED ORDER — ASPIRIN EC 81 MG PO TBEC: 81.0000 mg | DELAYED_RELEASE_TABLET | Freq: Every day | ORAL | 1 refills | Status: DC

## 2016-07-02 MED ORDER — MEPERIDINE HCL 50 MG/ML IJ SOLN: 6.2500 mg | INTRAMUSCULAR | Status: DC | PRN

## 2016-07-02 MED ORDER — PROPOFOL 10 MG/ML IV BOLUS
INTRAVENOUS | Status: DC | PRN
  Administered 2016-07-02: 150 mg via INTRAVENOUS

## 2016-07-02 MED ORDER — LACTATED RINGERS IV SOLN
INTRAVENOUS | Status: DC
  Administered 2016-07-02: 08:00:00 via INTRAVENOUS

## 2016-07-02 MED ORDER — METOCLOPRAMIDE HCL 5 MG/ML IJ SOLN: 10.0000 mg | Freq: Once | INTRAMUSCULAR | Status: DC | PRN

## 2016-07-02 MED ORDER — EPINEPHRINE PF 1 MG/ML IJ SOLN
INTRAMUSCULAR | Status: DC | PRN
  Administered 2016-07-02: 2 mg

## 2016-07-02 MED ORDER — HYDROCODONE-ACETAMINOPHEN 5-325 MG PO TABS
1.0000 | ORAL_TABLET | ORAL | Status: DC | PRN
  Administered 2016-07-02: 1 via ORAL
  Filled 2016-07-02: qty 1

## 2016-07-02 MED ORDER — DEXAMETHASONE SODIUM PHOSPHATE 10 MG/ML IJ SOLN
INTRAMUSCULAR | Status: AC
  Filled 2016-07-02: qty 1

## 2016-07-02 MED ORDER — FENTANYL CITRATE (PF) 100 MCG/2ML IJ SOLN
INTRAMUSCULAR | Status: DC | PRN
  Administered 2016-07-02 (×4): 25 ug via INTRAVENOUS

## 2016-07-02 MED ORDER — LIDOCAINE-EPINEPHRINE 1 %-1:100000 IJ SOLN
INTRAMUSCULAR | Status: AC
  Filled 2016-07-02: qty 1

## 2016-07-02 MED ORDER — SODIUM CHLORIDE 0.9 % IR SOLN
Status: AC
  Filled 2016-07-02: qty 500000

## 2016-07-02 MED ORDER — LIDOCAINE-EPINEPHRINE 1 %-1:100000 IJ SOLN
INTRAMUSCULAR | Status: DC | PRN
  Administered 2016-07-02: 20 mL

## 2016-07-02 MED ORDER — LIDOCAINE HCL (CARDIAC) 20 MG/ML IV SOLN
INTRAVENOUS | Status: DC | PRN
  Administered 2016-07-02: 50 mg via INTRAVENOUS

## 2016-07-02 MED ORDER — DEXAMETHASONE SODIUM PHOSPHATE 10 MG/ML IJ SOLN
INTRAMUSCULAR | Status: DC | PRN
  Administered 2016-07-02: 10 mg via INTRAVENOUS

## 2016-07-02 MED ORDER — PHENYLEPHRINE HCL 10 MG/ML IJ SOLN
INTRAMUSCULAR | Status: DC | PRN
  Administered 2016-07-02: 80 ug via INTRAVENOUS

## 2016-07-02 MED ORDER — ONDANSETRON HCL 4 MG/2ML IJ SOLN
INTRAMUSCULAR | Status: DC | PRN
  Administered 2016-07-02: 4 mg via INTRAVENOUS

## 2016-07-02 MED ORDER — CEFAZOLIN SODIUM-DEXTROSE 2-4 GM/100ML-% IV SOLN
2.0000 g | INTRAVENOUS | Status: AC
  Administered 2016-07-02: 2 g via INTRAVENOUS

## 2016-07-02 MED ORDER — FENTANYL CITRATE (PF) 100 MCG/2ML IJ SOLN: 25.0000 ug | INTRAMUSCULAR | Status: DC | PRN

## 2016-07-02 MED ORDER — DOCUSATE SODIUM 100 MG PO CAPS: 100.0000 mg | ORAL_CAPSULE | Freq: Two times a day (BID) | ORAL | 1 refills | Status: DC | PRN

## 2016-07-02 MED ORDER — CEFAZOLIN SODIUM-DEXTROSE 2-4 GM/100ML-% IV SOLN
INTRAVENOUS | Status: AC
  Filled 2016-07-02: qty 100

## 2016-07-02 MED ORDER — EPINEPHRINE PF 1 MG/ML IJ SOLN
INTRAMUSCULAR | Status: AC
  Filled 2016-07-02: qty 2

## 2016-07-02 MED ORDER — EPHEDRINE 5 MG/ML INJ
INTRAVENOUS | Status: AC
  Filled 2016-07-02: qty 10

## 2016-07-02 MED ORDER — ONDANSETRON HCL 4 MG/2ML IJ SOLN
INTRAMUSCULAR | Status: AC
  Filled 2016-07-02: qty 2

## 2016-07-02 SURGICAL SUPPLY — 25 items
BANDAGE ACE 6X5 VEL STRL LF (GAUZE/BANDAGES/DRESSINGS) ×3 IMPLANT
BLADE 4.2CUDA (BLADE) IMPLANT
BLADE CUDA SHAVER 3.5 (BLADE) ×3 IMPLANT
BOOTIES KNEE HIGH SLOAN (MISCELLANEOUS) ×6 IMPLANT
CLOTH 2% CHLOROHEXIDINE 3PK (PERSONAL CARE ITEMS) ×3 IMPLANT
DRSG EMULSION OIL 3X3 NADH (GAUZE/BANDAGES/DRESSINGS) ×3 IMPLANT
DRSG PAD ABDOMINAL 8X10 ST (GAUZE/BANDAGES/DRESSINGS) ×6 IMPLANT
DURAPREP 26ML APPLICATOR (WOUND CARE) ×3 IMPLANT
GAUZE SPONGE 4X4 12PLY STRL (GAUZE/BANDAGES/DRESSINGS) ×3 IMPLANT
GLOVE BIOGEL PI IND STRL 7.0 (GLOVE) ×1 IMPLANT
GLOVE BIOGEL PI INDICATOR 7.0 (GLOVE) ×2
GLOVE SURG SS PI 7.0 STRL IVOR (GLOVE) ×3 IMPLANT
GLOVE SURG SS PI 7.5 STRL IVOR (GLOVE) IMPLANT
GLOVE SURG SS PI 8.0 STRL IVOR (GLOVE) ×3 IMPLANT
GOWN STRL REUS W/TWL XL LVL3 (GOWN DISPOSABLE) ×3 IMPLANT
KIT BASIN OR (CUSTOM PROCEDURE TRAY) ×3 IMPLANT
MANIFOLD NEPTUNE II (INSTRUMENTS) ×3 IMPLANT
PACK ARTHROSCOPY WL (CUSTOM PROCEDURE TRAY) ×3 IMPLANT
PADDING CAST COTTON 6X4 STRL (CAST SUPPLIES) ×3 IMPLANT
SUT ETHILON 4 0 PS 2 18 (SUTURE) ×3 IMPLANT
TOWEL OR 17X26 10 PK STRL BLUE (TOWEL DISPOSABLE) ×3 IMPLANT
TUBING ARTHRO INFLOW-ONLY STRL (TUBING) ×3 IMPLANT
WAND HAND CNTRL MULTIVAC 50 (MISCELLANEOUS) ×3 IMPLANT
WAND HAND CNTRL MULTIVAC 90 (MISCELLANEOUS) IMPLANT
WRAP KNEE MAXI GEL POST OP (GAUZE/BANDAGES/DRESSINGS) ×3 IMPLANT

## 2016-07-02 NOTE — Brief Op Note (Signed)
07/02/2016  9:23 AM  PATIENT:  Brenda [name]  82 y.o. female  PRE-OPERATIVE DIAGNOSIS:  DJD, LATERAL MENISCUS TEAR,MEDIAL MENISCUS TEAR RIGHT KNEE  POST-OPERATIVE DIAGNOSIS:  DJD, LATERAL MENISCUS TEAR,MEDIAL MENISCUS   PROCEDURE:  Procedure(s): ARTHROSCOPY KNEE WITH DEBRIDEMENT, PARTIAL LATERAL AND MEDIAL MENISCECTOMY, with drainage of cyst (Right)  SURGEON:  Surgeon(s) and Role:    * Susa Day, MD - Primary  PHYSICIAN ASSISTANT:   ASSISTANTS: [place]   ANESTHESIA:   general  EBL:  Total I/O In: 700 [I.V.:700] Out: -   BLOOD ADMINISTERED:none  DRAINS: none   LOCAL MEDICATIONS USED:  LIDOCAINE   SPECIMEN:  No Specimen  DISPOSITION OF SPECIMEN:  N/A  COUNTS:  YES  TOURNIQUET:  * No tourniquets in log *  DICTATION: .Other Dictation: Dictation Number [id number]  PLAN OF CARE: Discharge to home after PACU  PATIENT DISPOSITION:  PACU - hemodynamically stable.   Delay start of Pharmacological VTE agent (>24hrs) due to surgical blood loss or risk of bleeding: no

## 2016-07-02 NOTE — Interval H&P Note (Signed)
History and Physical Interval Note:  07/02/2016 8:19 AM  Brenda [name]  has presented today for surgery, with the diagnosis of DJD, LATERAL MENISCUS TEAR,MEDIAL MENISCUS TEAR RIGHT KNEE  The various methods of treatment have been discussed with the patient and family. After consideration of risks, benefits and other options for treatment, the patient has consented to  Procedure(s): ARTHROSCOPY KNEE WITH DEBRIDEMENT, PARTIAL LATERAL AND MEDIAL MENISCECTOMY (Right) as a surgical intervention .  The patient's history has been reviewed, patient examined, no change in status, stable for surgery.  I have reviewed the patient's chart and labs.  Questions were answered to the patient's satisfaction.     Bryer Cozzolino C

## 2016-07-02 NOTE — Anesthesia Postprocedure Evaluation (Signed)
Anesthesia Post Note  Patient: Brenda [name]  Procedure(s) Performed: Procedure(s) (LRB): ARTHROSCOPY KNEE WITH DEBRIDEMENT, PARTIAL LATERAL AND MEDIAL MENISCECTOMY, with drainage of cyst (Right)  Patient location during evaluation: PACU Anesthesia Type: General Level of consciousness: awake and alert Pain management: pain level controlled Vital Signs Assessment: post-procedure vital signs reviewed and stable Respiratory status: spontaneous breathing, nonlabored ventilation, respiratory function stable and patient connected to nasal cannula oxygen Cardiovascular status: blood pressure returned to baseline and stable Postop Assessment: no signs of nausea or vomiting Anesthetic complications: no    Last Vitals:  Vitals:   07/02/16 1017 07/02/16 1133  BP: (!) 136/59 134/74  Pulse: 68 72  Resp: 14 18  Temp: 36.4 C 36.7 C    Last Pain:  Vitals:   07/02/16 1133  TempSrc: Oral  PainSc: 2                  Montez Hageman

## 2016-07-02 NOTE — Anesthesia Preprocedure Evaluation (Signed)
Anesthesia Evaluation  Patient identified by MRN, date of birth, ID band Patient awake    Reviewed: Allergy & Precautions, NPO status , Patient's Chart, lab work & pertinent test results  History of Anesthesia Complications (+) PONV  Airway Mallampati: II  TM Distance: >3 FB Neck ROM: Full    Dental no notable dental hx.    Pulmonary neg pulmonary ROS,    Pulmonary exam normal breath sounds clear to auscultation       Cardiovascular negative cardio ROS Normal cardiovascular exam Rhythm:Regular Rate:Normal     Neuro/Psych negative neurological ROS  negative psych ROS   GI/Hepatic negative GI ROS, Neg liver ROS,   Endo/Other  negative endocrine ROS  Renal/GU negative Renal ROS  negative genitourinary   Musculoskeletal negative musculoskeletal ROS (+)   Abdominal   Peds negative pediatric ROS (+)  Hematology CLL   Anesthesia Other Findings   Reproductive/Obstetrics negative OB ROS                             Anesthesia Physical Anesthesia Plan  ASA: II  Anesthesia Plan: General   Post-op Pain Management:    Induction: Intravenous  Airway Management Planned: Oral ETT  Additional Equipment:   Intra-op Plan:   Post-operative Plan: Extubation in OR  Informed Consent: I have reviewed the patients History and Physical, chart, labs and discussed the procedure including the risks, benefits and alternatives for the proposed anesthesia with the patient or authorized representative who has indicated his/her understanding and acceptance.   Dental advisory given  Plan Discussed with: CRNA  Anesthesia Plan Comments:         Anesthesia Quick Evaluation

## 2016-07-02 NOTE — Transfer of Care (Signed)
Immediate Anesthesia Transfer of Care Note  Patient: Brenda [name]  Procedure(s) Performed: Procedure(s): ARTHROSCOPY KNEE WITH DEBRIDEMENT, PARTIAL LATERAL AND MEDIAL MENISCECTOMY, with drainage of cyst (Right)  Patient Location: PACU  Anesthesia Type:General  Level of Consciousness:  sedated, patient cooperative and responds to stimulation  Airway & Oxygen Therapy:Patient Spontanous Breathing and Patient connected to face mask oxgen  Post-op Assessment:  Report given to PACU RN and Post -op Vital signs reviewed and stable  Post vital signs:  Reviewed and stable  Last Vitals:  Vitals:   07/02/16 0731 07/02/16 0935  BP: (!) 166/72 136/68  Pulse: 88 81  Resp: 16 10  Temp: 36.7 C (P) [id number]    Complications: No apparent anesthesia complications

## 2016-07-02 NOTE — H&P (View-Only) (Signed)
Brenda [name] is an 82 y.o. female.   Chief Complaint: R knee pain HPI: The patient is a 82 year old female who presents today for follow up of their knee. The patient is being followed for their right knee pain and osteoarthritis. They are now 5 1/2 weeks out from a Euflexxa series. Symptoms reported today include: pain. Current treatment includes: activity modification, NSAIDs and pain medications. The following medication has been used for pain control: antiinflammatory medication and Tylenol. The patient presents today following MRI. The patient has not gotten any relief of their symptoms with viscosupplementation.  Arnesia Genther follows up with her MRI of her knee. She has a complex lateral meniscus tear, medial meniscus tear and fairly severe patellofemoral arthrosis. She has a Child psychotherapist cyst posteriorly and moderate joint effusion. She continues to report lateral joint pain when she is walking and it keeps her from walking, not particularly patellofemoral pain when she goes up and down stairs. No pain posteriorly. She also recently had shortness of breath and has a workup pending for that by her cardiologist.  Past Medical History:  Diagnosis Date  . Anxiety   . Arthritis   . CLL (chronic lymphocytic leukemia) ([place])   . Complication of anesthesia   . Depression   . Depression with anxiety   . Diverticulosis   . Dyspnea   . GERD (gastroesophageal reflux disease)   . Hearing loss   . Memory loss   . Mild cognitive impairment   . Osteopenia   . PONV (postoperative nausea and vomiting)   . Reflux   . SOB (shortness of breath)     Past Surgical History:  Procedure Laterality Date  . ABDOMINAL HYSTERECTOMY    . APPENDECTOMY    . CARDIAC EVENT MONITOR  02/03/2005   30 days, normal event monitor  . CATARACT EXTRACTION Bilateral   . EYE SURGERY     squamous cell carcinoma surgery in right eye lid.  Marland Kitchen NM GATED MYOCARDIAL STUDY ([facility] HX)  01/23/2011   Normal pattern of perfusion in all  regions. post-stress EF is 85%. EKG negative for ischemia. no ECG changes.  . TONSILLECTOMY    . TRANSTHORACIC ECHOCARDIOGRAM  10/31/2010   EF >55%, there is concern for inferoacpical infarct with possible inferoapical aneurysm or pseudoaneurysm - could be echo artificat    Family History  Problem Relation Age of Onset  . Alzheimer's disease Father   . Atrial fibrillation Brother    Social History:  reports that she has never smoked. She has never used smokeless tobacco. She reports that she drinks alcohol. She reports that she does not use drugs.  Allergies:  Allergies  Allergen Reactions  . Adhesive [Tape] Rash  . Codeine Nausea And Vomiting  . Doxycycline Rash    Severe Reflux , cheek "flushing"  . Sulfa Antibiotics Rash     (Not in a hospital admission)  No results found for this or any previous visit (from the past 48 hour(s)). No results found.  Review of Systems  Constitutional: Negative.   HENT: Negative.   Eyes: Negative.   Respiratory: Negative.   Cardiovascular: Negative.   Gastrointestinal: Negative.   Genitourinary: Negative.   Musculoskeletal: Positive for joint pain.  Skin: Negative.   Neurological: Negative.   Psychiatric/Behavioral: Negative.     There were no vitals taken for this visit. Physical Exam  Constitutional: She is oriented to person, place, and time. She appears well-developed and well-nourished.  HENT:  Head: Normocephalic.  Eyes: Pupils  are equal, round, and reactive to light.  Neck: Normal range of motion.  Cardiovascular: Normal rate.   Respiratory: Effort normal.  GI: Soft.  Musculoskeletal:  On exam, exquisitely tender in the lateral joint line. Moderate effusion. Equivocal McMurray.  Knee exam on inspection reveals no evidence of soft tissue swelling, ecchymosis, deformity or erythema. On palpation there is no tenderness in the medial joint line. No patellofemoral pain with compression. Nontender over the fibular head or the  peroneal nerve. Nontender over the quadriceps insertion of the patellar ligament insertion. The range of motion was full. Provocative maneuvers revealed a negative Lachman, negative anterior and posterior drawer. No instability was noted with varus and valgus stressing at 0 or 30 degrees. On manual motor test the quadriceps and hamstrings were 5/5. Sensory exam was intact to light touch.  Neurological: She is alert and oriented to person, place, and time.  Skin: Skin is warm and dry.    On MRI she has a complex lateral meniscus tear, medial meniscus tear and fairly severe patellofemoral arthrosis. She has a Child psychotherapist cyst posteriorly and moderate joint effusion.  Assessment/Plan 1. Symptomatic medial and lateral meniscus tears of the right knee, refractory to rest, activity modification, home exercises, cortisone, and viscosupplementation. 2. Recent shortness of breath, cardiac evaluation pending.  We had an extensive discussion concerning current pathology, relevant anatomy, and treatment options either living with the symptoms versus consideration of arthroscopic partial meniscectomy and debridement. I do not feel she needs a total knee replacement at this point in time as her pain is mainly lateral where the joint space is fairly well maintained. We discussed the procedure in detail.  I had a long discussion with the patient concerning the risks and benefits of knee arthroscopy including help from the arthroscopic procedure as well as no help from the arthroscopic procedure or worsening of symptoms. Also discussed infection, DVT, PE, anesthetic complications, etc. Also discussed the possibility of repeat arthroscopic surgery required in the future or total knee replacement. I provided the patient with an illustrated handout and discussed it in detail as well as discussed the postoperative and perioperative courses and return to functional activities including work. Need for postoperative DVT prophylaxis  was discussed as well.  Of course, she would require preoperative clearance. She has a cardiac workup pending, last episode of shortness of breath was two weeks ago, not with efforts, standing at the sink, she is not tachycardic without chest pain or shortness of breath today. She had a recent EKG and apparently was unremarkable. No history of DVT or MRSA. Physical therapy postoperatively.  We spent considerable time discussing this.  Plan right knee arthroscopy, debridement, partial medial and lateral meniscectomies  Cecilie Kicks., PA-C for Dr. Tonita Cong 06/29/2016, 10:43 AM

## 2016-07-02 NOTE — Discharge Instructions (Signed)
General Anesthesia, Adult, Care After Refer to this sheet in the next few weeks. These instructions provide you with information on caring for yourself after your procedure. Your health care provider may also give you more specific instructions. Your treatment has been planned according to current medical practices, but problems sometimes occur. Call your health care provider if you have any problems or questions after your procedure. WHAT TO EXPECT AFTER THE PROCEDURE After the procedure, it is typical to experience:  Sleepiness.  Nausea and vomiting. HOME CARE INSTRUCTIONS  For the first 24 hours after general anesthesia:  Have a responsible person with you.  Do not drive a car. If you are alone, do not take public transportation.  Do not drink alcohol.  Do not take medicine that has not been prescribed by your health care provider.  Do not sign important papers or make important decisions.  You may resume a normal diet and activities as directed by your health care provider.  Change bandages (dressings) as directed.  If you have questions or problems that seem related to general anesthesia, call the hospital and ask for the anesthetist or anesthesiologist on call. SEEK MEDICAL CARE IF:  You have nausea and vomiting that continue the day after anesthesia.  You develop a rash. SEEK IMMEDIATE MEDICAL CARE IF:   You have difficulty breathing.  You have chest pain.  You have any allergic problems.   This information is not intended to replace advice given to you by your health care provider. Make sure you discuss any questions you have with your health care provider.   Document Released: 11/23/2000 Document Revised: 09/07/2014 Document Reviewed: 12/16/2011 Elsevier Interactive Patient Education 2016 [place] CARE INSTRUCTIONS   PAIN You will be expected to have a moderate amount of pain in the affected knee for  approximately two weeks.  However, the first two to four days will be the most severe in terms of the pain you will experience.  Prescriptions have been provided for you to take as needed for the pain.  The pain can be markedly reduced by using the ice/compressive bandage given.  Exchange the ice packs whenever they thaw.  During the night, keep the bandage on because it will still provide some compression for the swelling.  Also, keep the leg elevated on pillows above your heart, and this will help alleviate the pain and swelling. Norco or tylenol   for pain. Aspirin 81mg  per day  MEDICATION Prescriptions have been provided to take as needed for pain. To prevent blood clots, take Aspirin 81 mg daily with a meal if not on a blood thinner and if no history of stomach ulcers.  ACTIVITY It is preferred that you stay on bedrest for approximately 24 hours.  However, you may go to the bathroom with help.  After this, you can start to be up and about progressively more.  Remember that the swelling may still increase after three to four days if you are up and doing too much.  You may put as much weight on the affected leg as pain will allow.  Use your walker fro 1-2 days for comfort and safety.  However, as soon as you are able, you may discard the crutches and go without them.   DRESSING Keep the current dressing as dry as possible.  Two days after your surgery, you may remove the ice/compressive wrap, and surgical dressing.  You may now take  a shower, but do not scrub the sounds directly with soap.  Let water rinse over these and gently wipe with your hand.  Reapply band-aids over the puncture wounds and more gauze if needed.  A slight amount of thin drainage can be normal at this time, and do not let it frighten you.  Reapply the ice/compressive wrap.  You may now repeat this every day each time you shower.  SYMPTOMS TO REPORT TO YOUR DOCTOR  -Extreme pain.  -Extreme swelling.  -Temperature above 101  degrees that does not come down with acetaminophen     (Tylenol).  -Any changes in the feeling, color or movement of your toes.  -Extreme redness, heat, swelling or drainage at your incision  EXERCISE It is preferred that you begin to exercise on the day of your surgery.  Straight leg raises and short arc quads should be begun the afternoon or evening of surgery and continued until you come back for your follow-up appointment.   Attached is an instruction sheet on how to perform these two simple exercises.  Do these at least three times per day if not more.  You may bend your knee as much as is comfortable.  The puncture wounds may occasionally be slightly uncomfortable with bending of the knee.  Do not let this frighten you.  It is important to keep your knee motion, but do not overdo it.  If you have significant pain, simply do not bend the knee as far.   You will be given more exercises to perform at your first return visit.    RETURN APPOINTMENT Please make an appointment to be seen by your doctor 10-14 days from your surgery.  Patient Signature:  ________________________________________________________  Nurse's Signature:  ________________________________________________________

## 2016-07-03 NOTE — Op Note (Signed)
Brenda [name], Brenda [name] NO.:  [id number]  MEDICAL RECORD NO.:  [id number]  LOCATION:  [facility]                         FACILITY:  [facility]  PHYSICIAN:  Susa Day, M.D.    DATE OF BIRTH:  [birth year]  DATE OF PROCEDURE:  07/02/2016 DATE OF DISCHARGE:  07/02/2016                              OPERATIVE REPORT   PREOPERATIVE DIAGNOSES:  Degenerative joint disease; medial and lateral meniscus tears, right knee.  Associated popliteal cyst.  POSTOPERATIVE DIAGNOSES: 1. Degenerative joint disease; medial and lateral meniscus tears,     right knee.  Associated popliteal cyst with near grade 4 lesion,     medial femoral condyle, 1 x 2 cm. 2. Tearing of both medial and lateral meniscus. 3. Severe chondromalacia of the patellofemoral joint. 4. Popliteal cyst.  PROCEDURES PERFORMED: 1. Right knee arthroscopy. 2. Partial medial and lateral meniscectomies. 3. Chondroplasty of medial femoral condyle, patella, lateral tibial     plateau. 4. Drainage of popliteal cyst.  ANESTHESIA:  General.  ASSISTANT:  Cleophas Dunker, PA.  HISTORY:  An 81 year old female, locking and popping giving way, fusion of the knee, degenerative changes of the knee, MRI indicating medial and lateral meniscus tear, large leaking popliteal cyst.  She had tenderness mediolaterally, posteriorly, mechanical symptoms refractory to conservative treatment including rest, activity modification, home exercise program, injections and viscosupplementation.  She was indicated for knee arthroscopy, partial meniscectomy and debridement. We discussed potential draining of the popliteal cyst and that the popliteal cyst usually typically resolves on the intra-articular pathology results.  Discussed possibility of retained or recurrent popliteal cyst, requiring posterior aspiration and rarely surgical intervention.  TECHNIQUE:  With the patient in supine position, after induction of adequate general anesthesia,  2 g of Kefzol, the right lower extremity was prepped and draped in usual sterile fashion.  A lateral parapatellar portal was fashioned with #11 blade.  Ingress cannula atraumatically placed.  Copious portion of clear synovial fluid, cartilaginous debris was evacuated.  Under direct visualization, a medial parapatellar portal was fashioned with a #11 blade after localization with an 18-gauge needle sparing the medial meniscus.  Notably, there was extensive grade 3 changes and near grade 4 changes of medial and femoral condyle.  There was one area of near grade 4 of 1 x 2 cm.  Chondral flap tear, loose cartilaginous debris, extensive tearing degenerative of the entire meniscus.  Light chondroplasty performed in the femoral condyle and tibial plateau, basket to resect part of the meniscus, she was very tight posteriorly.  Unable to get a shaver back posteriorly, small basket.  There was no large displaced fragment came into the joint, however.  We felt the remnant would not cause mechanical obstruction. We lifted underneath some meniscus posteriorly on top of that expressing from the popliteal area any fluid coming from that cyst that could be expressed.  Then, exam of the ACL was unremarkable.  Lateral compartment was fairly tight, extensive grade 2 changes of the entire compartment, tearing of the midbody of the lateral meniscus, introduced a basket.  Posteriorly there, we were unable to open a basket because it fairly tight. Laterally, we resected midportion of the lateral meniscus and contoured with  a 3.5 shaver.  There was no displaced fragment into the joint, light chondroplasty was performed of tibial plateau and femoral condyle, no grade 4 changes.  Again posteriorly, we lift up the meniscus based on its the tibial and femoral sides and then palpated the popliteal area to express any fluid from the popliteal cyst that we could into the joint. Next, I examined the suprapatellar  pouch.  There was extensive patellofemoral arthrosis, normal patellofemoral tracking.  Light chondroplasty performed.  There were grade 3 and some grade 4 changes. The gutters were unremarkable.  Revisited all compartments.  No further pathology.  Minimal arthroscopic intervention.  I, therefore, removed all instrumentation.  Portals were closed with 4-0 nylon simple sutures, 0.25% Marcaine with epinephrine was infiltrated in the joint.  Wound was dressed sterilely, awakened without difficulty and transported to the recovery room in satisfactory condition.  The patient tolerated the procedure well.  No complications.  Assistant, Cleophas Dunker, [place].  Minimal blood loss.  Cleophas Dunker was used throughout the case for the patient's positioning and managing the inflow and outflow of the arthroscopic irrigant.     Susa Day, M.D.     Geralynn Rile  D:  07/02/2016  T:  07/03/2016  Job:  [id number]

## 2016-07-13 DIAGNOSIS — Z4789 Encounter for other orthopedic aftercare: Secondary | ICD-10-CM | POA: Diagnosis not present

## 2016-07-13 DIAGNOSIS — Z9889 Other specified postprocedural states: Secondary | ICD-10-CM | POA: Diagnosis not present

## 2016-07-13 DIAGNOSIS — S83241A Other tear of medial meniscus, current injury, right knee, initial encounter: Secondary | ICD-10-CM | POA: Diagnosis not present

## 2016-07-16 DIAGNOSIS — H01004 Unspecified blepharitis left upper eyelid: Secondary | ICD-10-CM | POA: Diagnosis not present

## 2016-07-16 DIAGNOSIS — H04123 Dry eye syndrome of bilateral lacrimal glands: Secondary | ICD-10-CM | POA: Diagnosis not present

## 2016-07-16 DIAGNOSIS — H01001 Unspecified blepharitis right upper eyelid: Secondary | ICD-10-CM | POA: Diagnosis not present

## 2016-07-27 DIAGNOSIS — R0602 Shortness of breath: Secondary | ICD-10-CM | POA: Diagnosis not present

## 2016-07-27 DIAGNOSIS — M79622 Pain in left upper arm: Secondary | ICD-10-CM | POA: Diagnosis not present

## 2016-07-27 DIAGNOSIS — R0600 Dyspnea, unspecified: Secondary | ICD-10-CM | POA: Diagnosis not present

## 2016-07-27 DIAGNOSIS — Z6829 Body mass index (BMI) 29.0-29.9, adult: Secondary | ICD-10-CM | POA: Diagnosis not present

## 2016-07-29 ENCOUNTER — Ambulatory Visit (INDEPENDENT_AMBULATORY_CARE_PROVIDER_SITE_OTHER): Payer: Medicare Other | Admitting: Cardiovascular Disease

## 2016-07-29 ENCOUNTER — Encounter: Payer: Self-pay | Admitting: Cardiovascular Disease

## 2016-07-29 DIAGNOSIS — M25512 Pain in left shoulder: Secondary | ICD-10-CM | POA: Diagnosis not present

## 2016-07-29 DIAGNOSIS — R0602 Shortness of breath: Secondary | ICD-10-CM | POA: Diagnosis not present

## 2016-07-29 DIAGNOSIS — R0600 Dyspnea, unspecified: Secondary | ICD-10-CM | POA: Insufficient documentation

## 2016-07-29 NOTE — Progress Notes (Signed)
Cardiology Consultation Note    Date:  07/29/2016   ID:  Brenda [name], DOB 1932-08-21, MRN [id number]  PCP:  Jerlyn Ly, MD  Cardiologist:   Sanda Klein, MD  Reason for consultation: Dyspnea at rest, fatigue Chief Complaint  Patient presents with  . Follow-up    PT C/O LEFT SHOULDER PAIN, WENT TO PCP ON MONDAY, THEY DID EKG AND SAID IT WAS NORMAL     History of Present Illness:  Brenda [name] is a 82 y.o. female presenting with complaints of unexpected shortness of breath and fatigue.  She is accompanied by her husband today. Recently had a transient respiratory infection with fever to 101 F, now improved. Knee arthroscopy was uneventful.  More and more, her dyspnea symptoms seem unrelated to exertion. If there is one predictable cause, that appears to be bending over. Her symptoms are decidedly mild and not consistently there.  She has had left shoulder pain, likewise nonexertional. ECG during symptoms was reported as normal.  Repeat echo on Jun 18 2016 showed normal LVEF and equivocal evidence for diastolic heart failure.There is no LVH. The left atrium is borderline dilated at 38 mm. The Doppler parameters were borderline for elevated mean LA pressure and the systolic PAP was mildly elevated. I suggested a trial of diuretics, but both the patient and Dr. Joylene Draft were reluctant due to her low BP.  Her event monitor was essentially normal, except for 3 episodes of 4-beat atrial tachycardia.  Reviewed the nuclear stress test from 2012, which appears normal.  She has gastroesophageal reflux disease that is well controlled, but if she forgets to take her proton pump inhibitor she immediately developed symptoms. She denies hoarseness or wheezing and does not have orthopnea, PND, hemoptysis, leg edema, claudication, focal neurological complaints or major weight changes. She describes having memory deficits.  Past Medical History:  Diagnosis Date  . Anxiety   . Arthritis   .  CLL (chronic lymphocytic leukemia) ([place])   . Complication of anesthesia   . Depression   . Depression with anxiety   . Diverticulosis   . Dyspnea   . GERD (gastroesophageal reflux disease)   . Hearing loss   . Memory loss   . Mild cognitive impairment   . Osteopenia   . PONV (postoperative nausea and vomiting)   . Reflux   . SOB (shortness of breath)     Past Surgical History:  Procedure Laterality Date  . ABDOMINAL HYSTERECTOMY    . APPENDECTOMY    . CARDIAC EVENT MONITOR  02/03/2005   30 days, normal event monitor  . CATARACT EXTRACTION Bilateral   . EYE SURGERY     squamous cell carcinoma surgery in right eye lid.  Marland Kitchen KNEE ARTHROSCOPY Right 07/02/2016   Procedure: ARTHROSCOPY KNEE WITH DEBRIDEMENT, PARTIAL LATERAL AND MEDIAL MENISCECTOMY, with drainage of cyst;  Surgeon: Susa Day, MD;  Location: [facility];  Service: Orthopedics;  Laterality: Right;  . NM GATED MYOCARDIAL STUDY (ARMX HX)  01/23/2011   Normal pattern of perfusion in all regions. post-stress EF is 85%. EKG negative for ischemia. no ECG changes.  . TONSILLECTOMY    . TRANSTHORACIC ECHOCARDIOGRAM  10/31/2010   EF >55%, there is concern for inferoacpical infarct with possible inferoapical aneurysm or pseudoaneurysm - could be echo artificat    Current Medications: Outpatient Medications Prior to Visit  Medication Sig Dispense Refill  . Acetaminophen (TYLENOL PO) Take 1 tablet by mouth at bedtime.    Marland Kitchen aspirin EC 81 MG  tablet Take 1 tablet (81 mg total) by mouth daily. 14 tablet 1  . atorvastatin (LIPITOR) 20 MG tablet Take 20 mg by mouth daily at 6 PM.    . citalopram (CELEXA) 10 MG tablet Take 1 tablet (10 mg total) by mouth daily. 30 tablet 11  . erythromycin ophthalmic ointment Place 1 application into both eyes at bedtime.    Marland Kitchen estradiol (ESTRING) 2 MG vaginal ring Place 2 mg vaginally every 3 (three) months. follow package directions    . ketotifen (ZADITOR) 0.025 % ophthalmic solution Place 1 drop into  both eyes as needed (burning and eye itching).    . LORazepam (ATIVAN) 0.5 MG tablet Take 0.5 mg by mouth at bedtime.     . pantoprazole (PROTONIX) 40 MG tablet Take 40 mg by mouth daily.     Marland Kitchen ZETIA 10 MG tablet Take 10 mg by mouth at bedtime.     Marland Kitchen zolpidem (AMBIEN) 5 MG tablet Take 5 mg by mouth at bedtime.     . docusate sodium (COLACE) 100 MG capsule Take 1 capsule (100 mg total) by mouth 2 (two) times daily as needed for mild constipation. (Patient not taking: Reported on 07/29/2016) 30 capsule 1  . HYDROcodone-acetaminophen (NORCO/VICODIN) 5-325 MG tablet Take 1 tablet by mouth every 4 (four) hours as needed for severe pain. (Patient not taking: Reported on 07/29/2016) 40 tablet 0   No facility-administered medications prior to visit.      Allergies:   Adhesive [tape]; Codeine; Doxycycline; and Sulfa antibiotics   Social History   Social History  . Marital status: Married    Spouse name: [name]  . Number of children: 2  . Years of education: college   Social History Main Topics  . Smoking status: Never Smoker  . Smokeless tobacco: Never Used  . Alcohol use Yes     Comment: wine daily   . Drug use: No  . Sexual activity: Not Asked   Other Topics Concern  . None   Social History Narrative   Patient lives at home with husband [name].    Patient is retired.    Patient has a college education.    Patient has 2 children.    Patient drinks 2 cups of coffee daily.      Family History:  The patient's family history includes Alzheimer's disease in her father; Atrial fibrillation in her brother.   ROS:   Please see the history of present illness.    ROS All other systems reviewed and are negative.   PHYSICAL EXAM:   VS:  BP 110/60 (BP Location: Right Arm, Patient Position: Sitting, Cuff Size: Normal)   Pulse 90   Ht 5\' 6"  (1.676 m)   Wt 171 lb (77.6 kg)   SpO2 96%   BMI 27.60 kg/m    GEN: Well nourished, well developed, in no acute distress  HEENT: normal  Neck:  no JVD, carotid bruits, or masses Cardiac: RRR; no murmurs, rubs, or gallops,no edema  Respiratory:  clear to auscultation bilaterally, normal work of breathing GI: soft, nontender, nondistended, + BS MS: no deformity or atrophy  Skin: warm and dry, no rash Neuro:  Alert and Oriented x 3, Strength and sensation are intact Psych: euthymic mood, full affect  Wt Readings from Last 3 Encounters:  07/29/16 171 lb (77.6 kg)  07/02/16 173 lb (78.5 kg)  06/26/16 173 lb (78.5 kg)      Studies/Labs Reviewed:   EKG:  EKG is ordered today.  The ekg ordered 05/12/16 demonstrates NSR, small R waves in V1-V2, no repolarization abnormalities, QTC 409 ms Recent Labs: September 12 BUN 17, creatinine 0.7, glucose 111, potassium 4.6  Lipid Panel 06/27/2015 total cholesterol 172, triglycerides 137, HDL 46, LDL 99   ASSESSMENT:    1. Shortness of breath   2. Acute pain of left shoulder      PLAN:  In order of problems listed above:  1. I remain skeptical that her dyspnea is cardiac in etiology. We could settle it with a right and left heart catheterization to finally clarify the etiology of her shortness of breath, but both the patient and I are reluctant to proceed with invasive procedures for symptoms that are mild and inconsistent. Still wonder whether this could be insufficiently treated test esophageal reflux disease and silent aspiration. Consider more consistent use of daily H2-blocker or PPI,. 2. Left shoulder pain has musculoskeletal features.     Medication Adjustments/Labs and Tests Ordered: Current medicines are reviewed at length with the patient today.  Concerns regarding medicines are outlined above.  Medication changes, Labs and Tests ordered today are listed in the Patient Instructions below. Patient Instructions  Dr Sallyanne Kuster recommends that you schedule a follow-up appointment in 12 months. You will receive a reminder letter in the mail two months in advance. If you don't  receive a letter, please call our office to schedule the follow-up appointment.  If you need a refill on your cardiac medications before your next appointment, please call your pharmacy.    Signed, Sanda Klein, MD  07/29/2016 9:35 PM    [facility] [address], [place], [place]  [address] Phone: [phone]; Fax: [phone]

## 2016-07-29 NOTE — Patient Instructions (Signed)
Dr Croitoru recommends that you schedule a follow-up appointment in 12 months. You will receive a reminder letter in the mail two months in advance. If you don't receive a letter, please call our office to schedule the follow-up appointment.  If you need a refill on your cardiac medications before your next appointment, please call your pharmacy. 

## 2016-08-03 DIAGNOSIS — S83241D Other tear of medial meniscus, current injury, right knee, subsequent encounter: Secondary | ICD-10-CM | POA: Diagnosis not present

## 2016-08-04 DIAGNOSIS — R7301 Impaired fasting glucose: Secondary | ICD-10-CM | POA: Diagnosis not present

## 2016-08-04 DIAGNOSIS — M859 Disorder of bone density and structure, unspecified: Secondary | ICD-10-CM | POA: Diagnosis not present

## 2016-08-04 DIAGNOSIS — E784 Other hyperlipidemia: Secondary | ICD-10-CM | POA: Diagnosis not present

## 2016-08-05 DIAGNOSIS — H01025 Squamous blepharitis left lower eyelid: Secondary | ICD-10-CM | POA: Diagnosis not present

## 2016-08-05 DIAGNOSIS — Z9842 Cataract extraction status, left eye: Secondary | ICD-10-CM | POA: Diagnosis not present

## 2016-08-05 DIAGNOSIS — H02722 Madarosis of right lower eyelid and periocular area: Secondary | ICD-10-CM | POA: Diagnosis not present

## 2016-08-05 DIAGNOSIS — H01021 Squamous blepharitis right upper eyelid: Secondary | ICD-10-CM | POA: Diagnosis not present

## 2016-08-05 DIAGNOSIS — Z961 Presence of intraocular lens: Secondary | ICD-10-CM | POA: Diagnosis not present

## 2016-08-05 DIAGNOSIS — Z885 Allergy status to narcotic agent status: Secondary | ICD-10-CM | POA: Diagnosis not present

## 2016-08-05 DIAGNOSIS — Z882 Allergy status to sulfonamides status: Secondary | ICD-10-CM | POA: Diagnosis not present

## 2016-08-05 DIAGNOSIS — H43393 Other vitreous opacities, bilateral: Secondary | ICD-10-CM | POA: Diagnosis not present

## 2016-08-05 DIAGNOSIS — H01024 Squamous blepharitis left upper eyelid: Secondary | ICD-10-CM | POA: Diagnosis not present

## 2016-08-05 DIAGNOSIS — Z9841 Cataract extraction status, right eye: Secondary | ICD-10-CM | POA: Diagnosis not present

## 2016-08-05 DIAGNOSIS — Z881 Allergy status to other antibiotic agents status: Secondary | ICD-10-CM | POA: Diagnosis not present

## 2016-08-05 DIAGNOSIS — H11823 Conjunctivochalasis, bilateral: Secondary | ICD-10-CM | POA: Diagnosis not present

## 2016-08-05 DIAGNOSIS — H01022 Squamous blepharitis right lower eyelid: Secondary | ICD-10-CM | POA: Diagnosis not present

## 2016-08-06 DIAGNOSIS — S83241D Other tear of medial meniscus, current injury, right knee, subsequent encounter: Secondary | ICD-10-CM | POA: Diagnosis not present

## 2016-08-10 DIAGNOSIS — F329 Major depressive disorder, single episode, unspecified: Secondary | ICD-10-CM | POA: Diagnosis not present

## 2016-08-10 DIAGNOSIS — M25562 Pain in left knee: Secondary | ICD-10-CM | POA: Diagnosis not present

## 2016-08-10 DIAGNOSIS — Z1389 Encounter for screening for other disorder: Secondary | ICD-10-CM | POA: Diagnosis not present

## 2016-08-10 DIAGNOSIS — R0602 Shortness of breath: Secondary | ICD-10-CM | POA: Diagnosis not present

## 2016-08-10 DIAGNOSIS — Z6829 Body mass index (BMI) 29.0-29.9, adult: Secondary | ICD-10-CM | POA: Diagnosis not present

## 2016-08-10 DIAGNOSIS — S83241D Other tear of medial meniscus, current injury, right knee, subsequent encounter: Secondary | ICD-10-CM | POA: Diagnosis not present

## 2016-08-10 DIAGNOSIS — M25561 Pain in right knee: Secondary | ICD-10-CM | POA: Diagnosis not present

## 2016-08-10 DIAGNOSIS — M4807 Spinal stenosis, lumbosacral region: Secondary | ICD-10-CM | POA: Diagnosis not present

## 2016-08-10 DIAGNOSIS — Z4789 Encounter for other orthopedic aftercare: Secondary | ICD-10-CM | POA: Diagnosis not present

## 2016-08-10 DIAGNOSIS — R413 Other amnesia: Secondary | ICD-10-CM | POA: Diagnosis not present

## 2016-08-10 DIAGNOSIS — Z9889 Other specified postprocedural states: Secondary | ICD-10-CM | POA: Diagnosis not present

## 2016-08-10 DIAGNOSIS — R600 Localized edema: Secondary | ICD-10-CM | POA: Diagnosis not present

## 2016-08-10 DIAGNOSIS — Z0000 Encounter for general adult medical examination without abnormal findings: Secondary | ICD-10-CM | POA: Diagnosis not present

## 2016-08-10 DIAGNOSIS — R7301 Impaired fasting glucose: Secondary | ICD-10-CM | POA: Diagnosis not present

## 2016-08-10 DIAGNOSIS — Z1231 Encounter for screening mammogram for malignant neoplasm of breast: Secondary | ICD-10-CM | POA: Diagnosis not present

## 2016-08-12 DIAGNOSIS — S83241D Other tear of medial meniscus, current injury, right knee, subsequent encounter: Secondary | ICD-10-CM | POA: Diagnosis not present

## 2016-08-17 DIAGNOSIS — S83241D Other tear of medial meniscus, current injury, right knee, subsequent encounter: Secondary | ICD-10-CM | POA: Diagnosis not present

## 2016-08-20 DIAGNOSIS — H04123 Dry eye syndrome of bilateral lacrimal glands: Secondary | ICD-10-CM | POA: Diagnosis not present

## 2016-08-20 DIAGNOSIS — H02052 Trichiasis without entropian right lower eyelid: Secondary | ICD-10-CM | POA: Diagnosis not present

## 2016-08-20 DIAGNOSIS — H02055 Trichiasis without entropian left lower eyelid: Secondary | ICD-10-CM | POA: Diagnosis not present

## 2016-08-20 DIAGNOSIS — H0015 Chalazion left lower eyelid: Secondary | ICD-10-CM | POA: Diagnosis not present

## 2016-09-03 DIAGNOSIS — L089 Local infection of the skin and subcutaneous tissue, unspecified: Secondary | ICD-10-CM | POA: Diagnosis not present

## 2016-09-03 DIAGNOSIS — Z6829 Body mass index (BMI) 29.0-29.9, adult: Secondary | ICD-10-CM | POA: Diagnosis not present

## 2016-09-03 DIAGNOSIS — H00015 Hordeolum externum left lower eyelid: Secondary | ICD-10-CM | POA: Diagnosis not present

## 2016-09-03 DIAGNOSIS — H0015 Chalazion left lower eyelid: Secondary | ICD-10-CM | POA: Diagnosis not present

## 2016-09-03 DIAGNOSIS — H01004 Unspecified blepharitis left upper eyelid: Secondary | ICD-10-CM | POA: Diagnosis not present

## 2016-09-03 DIAGNOSIS — H04123 Dry eye syndrome of bilateral lacrimal glands: Secondary | ICD-10-CM | POA: Diagnosis not present

## 2016-09-03 DIAGNOSIS — H01001 Unspecified blepharitis right upper eyelid: Secondary | ICD-10-CM | POA: Diagnosis not present

## 2016-09-07 DIAGNOSIS — H00011 Hordeolum externum right upper eyelid: Secondary | ICD-10-CM | POA: Diagnosis not present

## 2016-09-07 DIAGNOSIS — H01024 Squamous blepharitis left upper eyelid: Secondary | ICD-10-CM | POA: Diagnosis not present

## 2016-09-07 DIAGNOSIS — Z9841 Cataract extraction status, right eye: Secondary | ICD-10-CM | POA: Diagnosis not present

## 2016-09-07 DIAGNOSIS — Z888 Allergy status to other drugs, medicaments and biological substances status: Secondary | ICD-10-CM | POA: Diagnosis not present

## 2016-09-07 DIAGNOSIS — H01022 Squamous blepharitis right lower eyelid: Secondary | ICD-10-CM | POA: Diagnosis not present

## 2016-09-07 DIAGNOSIS — H04123 Dry eye syndrome of bilateral lacrimal glands: Secondary | ICD-10-CM | POA: Diagnosis not present

## 2016-09-07 DIAGNOSIS — H00014 Hordeolum externum left upper eyelid: Secondary | ICD-10-CM | POA: Diagnosis not present

## 2016-09-07 DIAGNOSIS — Z885 Allergy status to narcotic agent status: Secondary | ICD-10-CM | POA: Diagnosis not present

## 2016-09-07 DIAGNOSIS — Z961 Presence of intraocular lens: Secondary | ICD-10-CM | POA: Diagnosis not present

## 2016-09-07 DIAGNOSIS — H01021 Squamous blepharitis right upper eyelid: Secondary | ICD-10-CM | POA: Diagnosis not present

## 2016-09-07 DIAGNOSIS — H0289 Other specified disorders of eyelid: Secondary | ICD-10-CM | POA: Diagnosis not present

## 2016-09-07 DIAGNOSIS — H01025 Squamous blepharitis left lower eyelid: Secondary | ICD-10-CM | POA: Diagnosis not present

## 2016-09-07 DIAGNOSIS — Z91048 Other nonmedicinal substance allergy status: Secondary | ICD-10-CM | POA: Diagnosis not present

## 2016-09-07 DIAGNOSIS — Z79899 Other long term (current) drug therapy: Secondary | ICD-10-CM | POA: Diagnosis not present

## 2016-09-07 DIAGNOSIS — Z9842 Cataract extraction status, left eye: Secondary | ICD-10-CM | POA: Diagnosis not present

## 2016-09-07 DIAGNOSIS — H11823 Conjunctivochalasis, bilateral: Secondary | ICD-10-CM | POA: Diagnosis not present

## 2016-09-07 DIAGNOSIS — Z881 Allergy status to other antibiotic agents status: Secondary | ICD-10-CM | POA: Diagnosis not present

## 2016-09-07 DIAGNOSIS — H5710 Ocular pain, unspecified eye: Secondary | ICD-10-CM | POA: Diagnosis not present

## 2016-09-09 DIAGNOSIS — Z1389 Encounter for screening for other disorder: Secondary | ICD-10-CM | POA: Diagnosis not present

## 2016-09-09 DIAGNOSIS — Z6829 Body mass index (BMI) 29.0-29.9, adult: Secondary | ICD-10-CM | POA: Diagnosis not present

## 2016-09-09 DIAGNOSIS — L089 Local infection of the skin and subcutaneous tissue, unspecified: Secondary | ICD-10-CM | POA: Diagnosis not present

## 2016-09-10 DIAGNOSIS — Z85828 Personal history of other malignant neoplasm of skin: Secondary | ICD-10-CM | POA: Diagnosis not present

## 2016-09-10 DIAGNOSIS — L570 Actinic keratosis: Secondary | ICD-10-CM | POA: Diagnosis not present

## 2016-09-11 DIAGNOSIS — M859 Disorder of bone density and structure, unspecified: Secondary | ICD-10-CM | POA: Diagnosis not present

## 2016-09-14 ENCOUNTER — Ambulatory Visit (INDEPENDENT_AMBULATORY_CARE_PROVIDER_SITE_OTHER): Payer: Medicare Other | Admitting: Nurse Practitioner

## 2016-09-14 ENCOUNTER — Encounter: Payer: Self-pay | Admitting: Nurse Practitioner

## 2016-09-14 VITALS — BP 132/82 | HR 84 | Ht 66.0 in | Wt 175.6 lb

## 2016-09-14 DIAGNOSIS — R413 Other amnesia: Secondary | ICD-10-CM | POA: Diagnosis not present

## 2016-09-14 DIAGNOSIS — F411 Generalized anxiety disorder: Secondary | ICD-10-CM | POA: Diagnosis not present

## 2016-09-14 MED ORDER — CITALOPRAM HYDROBROMIDE 10 MG PO TABS: 10.0000 mg | ORAL_TABLET | Freq: Every day | ORAL | 11 refills | Status: DC

## 2016-09-14 NOTE — Progress Notes (Signed)
[facility]  PATIENT: Brenda [name] DOB: May 17, 1932   REASON FOR VISIT: Follow-up for memory loss, anxiety HISTORY FROM: Patient    HISTORY OF PRESENT ILLNESS:UPDATE 01/15/2018CM Brenda [name], 83 year old [name] returns for follow-up for mild memory loss and anxiety. She is currently on Celexa and has tolerated this medication well. She has a lot of osteoarthritis and does not exercise. She continues to be active socially and is independent in all activities of daily living. She continues to drive without any difficulty and no episodes of getting lost. She denies any balance issues and no recent falls. She returns for reevaluation and refills  UPDATE 09/16/15 Brenda [name], Brenda [name],alone at today's visit returns for followup. She was last seen 09/13/14 by Dr. Krista [name]. She continues to have a lot of anxiety and stress, she is unable to make quick decisions. She remains on Celexa. She continues to walk  some, has some back issues and sees Dr. Tonita [name].  She is active socially and does a lot of reading. She is independent in all activities of daily living. She continues to drive without difficulty. She has not wanted repeat neuropsych testing. Normal MRI of the brain and normal dementia labs. She returns for reevaluation   HISTORY: She is referred by her primary care physician for evaluation of short-term memory trouble. Initial evaluation June 2012  She began to notice gradual onset memory trouble since 2008, most of by herself, not by her family members. She noticed she has to concentrate to remember details, she tends to forget people's names difficult to come up with words sometimes, but she noticed similar problem in her husband and in her friends.   She tends to read a lot, history books, she has no difficulty keeping up with her reading, she is also socially active, she denied trouble handling her finances, or driving. Her father had Alzheimers  disease. She has a hx of depression. She is independent with ADL's and driving. She lives at [employer] [facility] some of the time. No other changes.   Neuropsych testing by Dr. Valentina [name] in 02/01/12 showed probable mild cognitive impairment -amnestic type as a cause of her memory retrieval. He recommends repeat testing in July 2014.   She restarted low dose Celexa in June 2013 after complaints of anxiety and mild depression. She says her memory is the same. She continues to drive without difficulty.   UPDATE Jan 14th 2016:YY She exercise regularly, reads well, still drive without difficulties,   REVIEW OF SYSTEMS: Full 14 system review of systems performed and notable only for those listed, all others are neg:  Constitutional: Activity change Cardiovascular: neg Ear/Nose/Throat:  hearing loss Skin: neg Eyes: Dry eyes Respiratory shortness of breath Genitourinary Urgencyl Hematology /Lymphatic:  easy bruising Endocrine: neg Musculoskeletal:neg Allergy/Immunology: neg NeurologicMemory loss  Psychiatric Anxiety, difficulty concentrating Sleep : neg   ALLERGIES: Allergies  Allergen Reactions  . Adhesive [Tape] Rash  . Codeine Nausea And Vomiting  . Doxycycline Rash    Severe Reflux , cheek "flushing"  . Sulfa Antibiotics Rash    HOME MEDICATIONS: Outpatient Medications Prior to Visit  Medication Sig Dispense Refill  . atorvastatin (LIPITOR) Brenda MG tablet Take Brenda mg by mouth daily at 6 PM.    . citalopram (CELEXA) 10 MG tablet Take 1 tablet (10 mg total) by mouth daily. 30 tablet 11  . estradiol (ESTRING) 2 MG vaginal ring Place 2 mg vaginally every 3 (three) months. follow package directions    .  LORazepam (ATIVAN) 0.5 MG tablet Take 0.5 mg by mouth at bedtime.     . pantoprazole (PROTONIX) 40 MG tablet Take 40 mg by mouth daily.     Marland Kitchen ZETIA 10 MG tablet Take 10 mg by mouth at bedtime.     Marland Kitchen zolpidem (AMBIEN) 5 MG tablet Take 5 mg by mouth at bedtime.     Marland Kitchen aspirin EC 81 MG tablet Take 1  tablet (81 mg total) by mouth daily. (Patient not taking: Reported on 09/14/2016) 14 tablet 1  . Acetaminophen (TYLENOL PO) Take 1 tablet by mouth at bedtime.    Marland Kitchen erythromycin ophthalmic ointment Place 1 application into both eyes at bedtime.    Marland Kitchen ketotifen (ZADITOR) 0.025 % ophthalmic solution Place 1 drop into both eyes as needed (burning and eye itching).     No facility-administered medications prior to visit.     PAST MEDICAL HISTORY: Past Medical History:  Diagnosis Date  . Anxiety   . Arthritis   . CLL (chronic lymphocytic leukemia) ([place])   . Complication of anesthesia   . Depression   . Depression with anxiety   . Diverticulosis   . Dyspnea   . GERD (gastroesophageal reflux disease)   . Hearing loss   . Memory loss   . Mild cognitive impairment   . Osteopenia   . PONV (postoperative nausea and vomiting)   . Reflux   . SOB (shortness of breath)     PAST SURGICAL HISTORY: Past Surgical History:  Procedure Laterality Date  . ABDOMINAL HYSTERECTOMY    . APPENDECTOMY    . CARDIAC EVENT MONITOR  02/03/2005   30 days, normal event monitor  . CATARACT EXTRACTION Bilateral   . EYE SURGERY     squamous cell carcinoma surgery in right eye lid.  Marland Kitchen KNEE ARTHROSCOPY Right 07/02/2016   Procedure: ARTHROSCOPY KNEE WITH DEBRIDEMENT, PARTIAL LATERAL AND MEDIAL MENISCECTOMY, with drainage of cyst;  Surgeon: Susa Day, MD;  Location: [facility];  Service: Orthopedics;  Laterality: Right;  . NM GATED MYOCARDIAL STUDY (ARMX HX)  01/23/2011   Normal pattern of perfusion in all regions. post-stress EF is 85%. EKG negative for ischemia. no ECG changes.  . TONSILLECTOMY    . TRANSTHORACIC ECHOCARDIOGRAM  10/31/2010   EF >55%, there is concern for inferoacpical infarct with possible inferoapical aneurysm or pseudoaneurysm - could be echo artificat    FAMILY HISTORY: Family History  Problem Relation Age of Onset  . Alzheimer's disease Father   . Atrial fibrillation Brother      SOCIAL HISTORY: Social History   Social History  . Marital status: Married    Spouse name: Brenda [name]  . Number of children: 2  . Years of education: college   Occupational History  . Not on file.   Social History Main Topics  . Smoking status: Never Smoker  . Smokeless tobacco: Never Used  . Alcohol use Yes     Comment: wine daily   . Drug use: No  . Sexual activity: Not on file   Other Topics Concern  . Not on file   Social History Narrative   Patient lives at home with husband Brenda [name].    Patient is retired.    Patient has a college education.    Patient has 2 children.    Patient drinks 2 cups of coffee daily.      PHYSICAL EXAM  Vitals:   09/14/16 0906  BP: 132/82  Pulse: 84  Weight: 175 lb 9.6 oz (79.Brenda  kg)  Height: 5\' 6"  (1.676 m)   Body mass index is 28.34 kg/m. Generalized: Well developed, in no acute distress  Head: normocephalic and atraumatic,. Oropharynx benign  Neck: Supple, no carotid bruits  Cardiac: Regular rate rhythm, no murmur  Musculoskeletal: No deformity  Neurological examination  Mentation: Alert oriented to time, place, history taking. MMSE 29 /30. AFT 13.Clock drawing 4/4 Cranial nerve II-XII: Pupils were equal round reactive to light extraocular movements were full, visual field were full on confrontational test. Facial sensation and strength were normal. hearing was intact to finger rubbing bilaterally. Uvula tongue midline. head turning and shoulder shrug and were normal and symmetric.Tongue protrusion into cheek strength was normal.  Motor: normal bulk and tone, full strength in the BUE, BLE, fine finger movements normal, no pronator drift. No focal weakness  Sensory: normal and symmetric to light touch, pinprick, and vibration in the upper and lower extremities Coordination: finger-nose-finger, heel-to-shin bilaterally, no dysmetria  Reflexes: Brachioradialis 2/2, biceps 2/2, triceps 2/2, patellar 2/2, Achilles 2/2, plantar  responses were flexor bilaterally.  Gait and Station: Rising up from seated position without assistance, normal stance, without trunk ataxia, moderate stride, good arm swing, smooth turning, able to perform tiptoe, and heel walking without difficulty. Tandem gait normal . No assistive device DIAGNOSTIC DATA (LABS, IMAGING, TESTING) -  ASSESSMENT AND PLAN Brenda y.o. year old [name]  has a past medical history of Mild cognitive impairment; CLL (chronic lymphocytic leukemia);  Depression; Anxiety; and Memory loss. here to followup. She has refused further neuropsych testing  Memory score is stable 29/ 30 AFT 13Clock drawing 4/4 Continue moderate exercise if tolerated Refill  Celexa Stay well-hydrated  Return to clinic in 1 year next with Dr. Doloris Hall Brenda [name], [facility], [facility], APRN  [facility] [address] [place], [place] [address] [phone]

## 2016-09-14 NOTE — Patient Instructions (Addendum)
Memory score is stable  Continue moderate exercise if tolerated Refill  Celexa Stay well-hydrated  Return to clinic in 1 year next with Dr. Krista Blue

## 2016-09-14 NOTE — Progress Notes (Signed)
I have reviewed and agreed above plan. 

## 2016-09-22 DIAGNOSIS — N3946 Mixed incontinence: Secondary | ICD-10-CM | POA: Diagnosis not present

## 2016-09-22 DIAGNOSIS — R300 Dysuria: Secondary | ICD-10-CM | POA: Diagnosis not present

## 2016-10-06 DIAGNOSIS — R309 Painful micturition, unspecified: Secondary | ICD-10-CM | POA: Diagnosis not present

## 2016-10-06 DIAGNOSIS — H01021 Squamous blepharitis right upper eyelid: Secondary | ICD-10-CM | POA: Diagnosis not present

## 2016-10-06 DIAGNOSIS — H01022 Squamous blepharitis right lower eyelid: Secondary | ICD-10-CM | POA: Diagnosis not present

## 2016-10-06 DIAGNOSIS — H11823 Conjunctivochalasis, bilateral: Secondary | ICD-10-CM | POA: Diagnosis not present

## 2016-10-06 DIAGNOSIS — Z961 Presence of intraocular lens: Secondary | ICD-10-CM | POA: Diagnosis not present

## 2016-10-06 DIAGNOSIS — N3942 Incontinence without sensory awareness: Secondary | ICD-10-CM | POA: Diagnosis not present

## 2016-10-06 DIAGNOSIS — H01025 Squamous blepharitis left lower eyelid: Secondary | ICD-10-CM | POA: Diagnosis not present

## 2016-10-06 DIAGNOSIS — H01024 Squamous blepharitis left upper eyelid: Secondary | ICD-10-CM | POA: Diagnosis not present

## 2016-10-06 DIAGNOSIS — N3000 Acute cystitis without hematuria: Secondary | ICD-10-CM | POA: Diagnosis not present

## 2016-10-06 DIAGNOSIS — H0289 Other specified disorders of eyelid: Secondary | ICD-10-CM | POA: Diagnosis not present

## 2016-10-22 DIAGNOSIS — N3020 Other chronic cystitis without hematuria: Secondary | ICD-10-CM | POA: Diagnosis not present

## 2016-10-27 DIAGNOSIS — Z85828 Personal history of other malignant neoplasm of skin: Secondary | ICD-10-CM | POA: Diagnosis not present

## 2016-10-27 DIAGNOSIS — D1801 Hemangioma of skin and subcutaneous tissue: Secondary | ICD-10-CM | POA: Diagnosis not present

## 2016-10-27 DIAGNOSIS — L821 Other seborrheic keratosis: Secondary | ICD-10-CM | POA: Diagnosis not present

## 2016-10-27 DIAGNOSIS — L570 Actinic keratosis: Secondary | ICD-10-CM | POA: Diagnosis not present

## 2016-11-04 DIAGNOSIS — H11441 Conjunctival cysts, right eye: Secondary | ICD-10-CM | POA: Diagnosis not present

## 2016-11-04 DIAGNOSIS — H11823 Conjunctivochalasis, bilateral: Secondary | ICD-10-CM | POA: Diagnosis not present

## 2016-11-04 DIAGNOSIS — Z961 Presence of intraocular lens: Secondary | ICD-10-CM | POA: Diagnosis not present

## 2016-11-04 DIAGNOSIS — H01024 Squamous blepharitis left upper eyelid: Secondary | ICD-10-CM | POA: Diagnosis not present

## 2016-11-04 DIAGNOSIS — H01022 Squamous blepharitis right lower eyelid: Secondary | ICD-10-CM | POA: Diagnosis not present

## 2016-11-04 DIAGNOSIS — Z885 Allergy status to narcotic agent status: Secondary | ICD-10-CM | POA: Diagnosis not present

## 2016-11-04 DIAGNOSIS — Z9841 Cataract extraction status, right eye: Secondary | ICD-10-CM | POA: Diagnosis not present

## 2016-11-04 DIAGNOSIS — Z881 Allergy status to other antibiotic agents status: Secondary | ICD-10-CM | POA: Diagnosis not present

## 2016-11-04 DIAGNOSIS — H01021 Squamous blepharitis right upper eyelid: Secondary | ICD-10-CM | POA: Diagnosis not present

## 2016-11-04 DIAGNOSIS — Z9842 Cataract extraction status, left eye: Secondary | ICD-10-CM | POA: Diagnosis not present

## 2016-11-04 DIAGNOSIS — Z882 Allergy status to sulfonamides status: Secondary | ICD-10-CM | POA: Diagnosis not present

## 2016-11-04 DIAGNOSIS — H01025 Squamous blepharitis left lower eyelid: Secondary | ICD-10-CM | POA: Diagnosis not present

## 2016-11-05 ENCOUNTER — Other Ambulatory Visit: Payer: Self-pay | Admitting: Internal Medicine

## 2016-11-05 DIAGNOSIS — Z1231 Encounter for screening mammogram for malignant neoplasm of breast: Secondary | ICD-10-CM

## 2016-11-06 ENCOUNTER — Ambulatory Visit
Admission: RE | Admit: 2016-11-06 | Discharge: 2016-11-06 | Disposition: A | Discharge status: Home / Self Care | Payer: Medicare Other | Source: Ambulatory Visit | Attending: Internal Medicine | Admitting: Internal Medicine

## 2016-11-06 DIAGNOSIS — Z1231 Encounter for screening mammogram for malignant neoplasm of breast: Secondary | ICD-10-CM | POA: Diagnosis not present

## 2016-11-13 DIAGNOSIS — N3000 Acute cystitis without hematuria: Secondary | ICD-10-CM | POA: Diagnosis not present

## 2016-11-13 DIAGNOSIS — R350 Frequency of micturition: Secondary | ICD-10-CM | POA: Diagnosis not present

## 2016-11-13 DIAGNOSIS — N390 Urinary tract infection, site not specified: Secondary | ICD-10-CM | POA: Diagnosis not present

## 2016-11-26 DIAGNOSIS — R300 Dysuria: Secondary | ICD-10-CM | POA: Diagnosis not present

## 2016-12-09 DIAGNOSIS — H04123 Dry eye syndrome of bilateral lacrimal glands: Secondary | ICD-10-CM | POA: Diagnosis not present

## 2016-12-09 DIAGNOSIS — H01024 Squamous blepharitis left upper eyelid: Secondary | ICD-10-CM | POA: Diagnosis not present

## 2016-12-09 DIAGNOSIS — H01025 Squamous blepharitis left lower eyelid: Secondary | ICD-10-CM | POA: Diagnosis not present

## 2016-12-09 DIAGNOSIS — H01022 Squamous blepharitis right lower eyelid: Secondary | ICD-10-CM | POA: Diagnosis not present

## 2016-12-09 DIAGNOSIS — H11823 Conjunctivochalasis, bilateral: Secondary | ICD-10-CM | POA: Diagnosis not present

## 2016-12-09 DIAGNOSIS — H01021 Squamous blepharitis right upper eyelid: Secondary | ICD-10-CM | POA: Diagnosis not present

## 2016-12-15 DIAGNOSIS — Z85828 Personal history of other malignant neoplasm of skin: Secondary | ICD-10-CM | POA: Diagnosis not present

## 2016-12-15 DIAGNOSIS — R21 Rash and other nonspecific skin eruption: Secondary | ICD-10-CM | POA: Diagnosis not present

## 2016-12-17 DIAGNOSIS — N3000 Acute cystitis without hematuria: Secondary | ICD-10-CM | POA: Diagnosis not present

## 2016-12-17 DIAGNOSIS — R350 Frequency of micturition: Secondary | ICD-10-CM | POA: Diagnosis not present

## 2016-12-28 DIAGNOSIS — R300 Dysuria: Secondary | ICD-10-CM | POA: Diagnosis not present

## 2016-12-28 DIAGNOSIS — N390 Urinary tract infection, site not specified: Secondary | ICD-10-CM | POA: Diagnosis not present

## 2017-01-18 DIAGNOSIS — H52203 Unspecified astigmatism, bilateral: Secondary | ICD-10-CM | POA: Diagnosis not present

## 2017-01-18 DIAGNOSIS — H01001 Unspecified blepharitis right upper eyelid: Secondary | ICD-10-CM | POA: Diagnosis not present

## 2017-01-18 DIAGNOSIS — H43813 Vitreous degeneration, bilateral: Secondary | ICD-10-CM | POA: Diagnosis not present

## 2017-01-18 DIAGNOSIS — H353131 Nonexudative age-related macular degeneration, bilateral, early dry stage: Secondary | ICD-10-CM | POA: Diagnosis not present

## 2017-01-18 DIAGNOSIS — R300 Dysuria: Secondary | ICD-10-CM | POA: Diagnosis not present

## 2017-01-18 DIAGNOSIS — N3020 Other chronic cystitis without hematuria: Secondary | ICD-10-CM | POA: Diagnosis not present

## 2017-01-26 DIAGNOSIS — Z9841 Cataract extraction status, right eye: Secondary | ICD-10-CM | POA: Diagnosis not present

## 2017-01-26 DIAGNOSIS — H04123 Dry eye syndrome of bilateral lacrimal glands: Secondary | ICD-10-CM | POA: Diagnosis not present

## 2017-01-26 DIAGNOSIS — L718 Other rosacea: Secondary | ICD-10-CM | POA: Diagnosis not present

## 2017-01-26 DIAGNOSIS — H0289 Other specified disorders of eyelid: Secondary | ICD-10-CM | POA: Diagnosis not present

## 2017-01-26 DIAGNOSIS — Z9889 Other specified postprocedural states: Secondary | ICD-10-CM | POA: Diagnosis not present

## 2017-01-26 DIAGNOSIS — Z881 Allergy status to other antibiotic agents status: Secondary | ICD-10-CM | POA: Diagnosis not present

## 2017-01-26 DIAGNOSIS — Z885 Allergy status to narcotic agent status: Secondary | ICD-10-CM | POA: Diagnosis not present

## 2017-01-26 DIAGNOSIS — Z961 Presence of intraocular lens: Secondary | ICD-10-CM | POA: Diagnosis not present

## 2017-01-26 DIAGNOSIS — Z9842 Cataract extraction status, left eye: Secondary | ICD-10-CM | POA: Diagnosis not present

## 2017-01-26 DIAGNOSIS — Z882 Allergy status to sulfonamides status: Secondary | ICD-10-CM | POA: Diagnosis not present

## 2017-01-27 DIAGNOSIS — R300 Dysuria: Secondary | ICD-10-CM | POA: Diagnosis not present

## 2017-01-27 DIAGNOSIS — N3020 Other chronic cystitis without hematuria: Secondary | ICD-10-CM | POA: Diagnosis not present

## 2017-02-05 DIAGNOSIS — M5137 Other intervertebral disc degeneration, lumbosacral region: Secondary | ICD-10-CM | POA: Diagnosis not present

## 2017-02-05 DIAGNOSIS — M5127 Other intervertebral disc displacement, lumbosacral region: Secondary | ICD-10-CM | POA: Diagnosis not present

## 2017-02-22 DIAGNOSIS — H04123 Dry eye syndrome of bilateral lacrimal glands: Secondary | ICD-10-CM | POA: Diagnosis not present

## 2017-02-22 DIAGNOSIS — H16223 Keratoconjunctivitis sicca, not specified as Sjogren's, bilateral: Secondary | ICD-10-CM | POA: Diagnosis not present

## 2017-03-02 DIAGNOSIS — M47816 Spondylosis without myelopathy or radiculopathy, lumbar region: Secondary | ICD-10-CM | POA: Diagnosis not present

## 2017-03-02 DIAGNOSIS — M5137 Other intervertebral disc degeneration, lumbosacral region: Secondary | ICD-10-CM | POA: Diagnosis not present

## 2017-03-15 DIAGNOSIS — R300 Dysuria: Secondary | ICD-10-CM | POA: Diagnosis not present

## 2017-03-15 DIAGNOSIS — N3946 Mixed incontinence: Secondary | ICD-10-CM | POA: Diagnosis not present

## 2017-03-15 DIAGNOSIS — N3020 Other chronic cystitis without hematuria: Secondary | ICD-10-CM | POA: Diagnosis not present

## 2017-03-16 DIAGNOSIS — M5127 Other intervertebral disc displacement, lumbosacral region: Secondary | ICD-10-CM | POA: Diagnosis not present

## 2017-03-16 DIAGNOSIS — M5137 Other intervertebral disc degeneration, lumbosacral region: Secondary | ICD-10-CM | POA: Diagnosis not present

## 2017-03-17 DIAGNOSIS — C9100 Acute lymphoblastic leukemia not having achieved remission: Secondary | ICD-10-CM | POA: Diagnosis not present

## 2017-03-17 DIAGNOSIS — M4130 Thoracogenic scoliosis, site unspecified: Secondary | ICD-10-CM | POA: Diagnosis not present

## 2017-03-17 DIAGNOSIS — R0602 Shortness of breath: Secondary | ICD-10-CM | POA: Diagnosis not present

## 2017-03-17 DIAGNOSIS — Z6829 Body mass index (BMI) 29.0-29.9, adult: Secondary | ICD-10-CM | POA: Diagnosis not present

## 2017-03-17 DIAGNOSIS — F3289 Other specified depressive episodes: Secondary | ICD-10-CM | POA: Diagnosis not present

## 2017-03-17 DIAGNOSIS — R413 Other amnesia: Secondary | ICD-10-CM | POA: Diagnosis not present

## 2017-03-18 ENCOUNTER — Encounter: Payer: Self-pay | Admitting: Psychology

## 2017-04-06 ENCOUNTER — Ambulatory Visit (INDEPENDENT_AMBULATORY_CARE_PROVIDER_SITE_OTHER): Payer: Medicare Other | Admitting: Emergency Medicine

## 2017-04-06 ENCOUNTER — Encounter: Payer: Self-pay | Admitting: Emergency Medicine

## 2017-04-06 DIAGNOSIS — R0602 Shortness of breath: Secondary | ICD-10-CM | POA: Diagnosis not present

## 2017-04-06 NOTE — Progress Notes (Signed)
Subjective:    Patient ID: Brenda [name], female    DOB: Nov 01, 1931, 83 y.o.   MRN: [id number]  HPI 83 yo woman, never smoker, hx of CLL, anxiety, GERD, freq UTI's w abx (not nitrofurantoin). She is referred for dyspnea. She states that over the last 2-3 months she has noticed an increased exertional SOB. She notes that her walking routine has decreased over the last several months. More SOB with bending after eating. She has chronic back pain. She had PFT done, not in our system and not available to me yet. she had a normal CXR w ? Hyperinflation per her report.  She was started on Anoro empirically earlier a week - hasn't changed her breathing, has made her cough some. Non-productive. No wheeze, no CP.   She has a back brace, started using it several months ago, her husband believes for a year   Review of Systems  Constitutional: Negative for fever and unexpected weight change.  HENT: Negative for congestion, dental problem, ear pain, nosebleeds, postnasal drip, rhinorrhea, sinus pressure, sneezing, sore throat and trouble swallowing.   Eyes: Negative for redness and itching.  Respiratory: Positive for cough, shortness of breath and wheezing. Negative for chest tightness.   Cardiovascular: Negative for palpitations and leg swelling.  Gastrointestinal: Negative for nausea and vomiting.  Genitourinary: Negative for dysuria.  Musculoskeletal: Negative for joint swelling.  Skin: Negative for rash.  Neurological: Negative for headaches.  Hematological: Does not bruise/bleed easily.  Psychiatric/Behavioral: Negative for dysphoric mood. The patient is not nervous/anxious.               Past Medical History:  Diagnosis Date  . Anxiety   . Arthritis   . CLL (chronic lymphocytic leukemia) ([place])   . Complication of anesthesia   . Depression   . Depression with anxiety   . Diverticulosis   . Dyspnea   . GERD (gastroesophageal reflux disease)   . Hearing loss   . Memory loss   . Mild  cognitive impairment   . Osteopenia   . PONV (postoperative nausea and vomiting)   . Reflux   . SOB (shortness of breath)      Family History  Problem Relation Age of Onset  . Alzheimer's disease Father   . Atrial fibrillation Brother      Social History   Social History  . Marital status: Married    Spouse name: [name]  . Number of children: 2  . Years of education: college   Occupational History  . Not on file.   Social History Main Topics  . Smoking status: Never Smoker  . Smokeless tobacco: Never Used  . Alcohol use Yes     Comment: wine daily   . Drug use: No  . Sexual activity: Not on file   Other Topics Concern  . Not on file   Social History Narrative   Patient lives at home with husband [name].    Patient is retired.    Patient has a college education.    Patient has 2 children.    Patient drinks 2 cups of coffee daily.   worked at a PT, inhaled exposures.     Allergies  Allergen Reactions  . Adhesive [Tape] Rash  . Codeine Nausea And Vomiting  . Doxycycline Rash    Severe Reflux , cheek "flushing"  . Sulfa Antibiotics Rash     Outpatient Medications Prior to Visit  Medication Sig Dispense Refill  . atorvastatin (LIPITOR) 20 MG tablet  Take 20 mg by mouth daily at 6 PM.    . citalopram (CELEXA) 10 MG tablet Take 1 tablet (10 mg total) by mouth daily. 30 tablet 11  . estradiol (ESTRING) 2 MG vaginal ring Place 2 mg vaginally every 3 (three) months. follow package directions    . LORazepam (ATIVAN) 0.5 MG tablet Take 0.5 mg by mouth at bedtime.     Marland Kitchen OVER THE COUNTER MEDICATION thera tears prn dry ears    . pantoprazole (PROTONIX) 40 MG tablet Take 40 mg by mouth daily.     Marland Kitchen ZETIA 10 MG tablet Take 10 mg by mouth at bedtime.     Marland Kitchen zolpidem (AMBIEN) 5 MG tablet Take 5 mg by mouth at bedtime.     Marland Kitchen aspirin EC 81 MG tablet Take 1 tablet (81 mg total) by mouth daily. (Patient not taking: Reported on 09/14/2016) 14 tablet 1  . Triamcinolone Acetonide  (NASACORT ALLERGY 24HR NA) Place into the nose. Once daily    . UNABLE TO FIND Med Name:  autolougus blood serum [facility] for once month  (1 gtt QID both eyes for blepahritis)     No facility-administered medications prior to visit.         Objective:   Physical Exam Vitals:   04/06/17 0914  BP: 124/76  Pulse: 82  SpO2: 96%  Weight: 171 lb (77.6 kg)  Height: 5\' 6"  (1.676 m)   Gen: Pleasant, Overweight woman, in no distress,  normal affect  ENT: No lesions,  mouth clear,  oropharynx clear, no postnasal drip  Neck: No JVD, no stridor  Lungs: No use of accessory muscles, good air movement with no crackles or wheezes heard  Cardiovascular: RRR, heart sounds normal, no murmur or gallops, no peripheral edema  Musculoskeletal: No deformities, no cyanosis or clubbing  Neuro: alert, awake and interactive. Follows commands. Her memory is somewhat limited, she has to estimate symptoms and timing  Skin: Warm, no lesions or rashes     Assessment & Plan:  Dyspnea Based on her description I am most suspicious that she is experiencing restrictive as etiology, multifactorial due to some mild kyphosis and wearing a back brace due to her back discomfort, restricted diaphragmatic excursion after eating. Also some degree of deconditioning because she has had to curtail her exercise routine, mainly due to back discomfort. Her chest x-ray was hyperinflated and empiric Anoro was started. Not clear that she has benefited but she hasn't been on it long enough to reassess. She has had the side effect of dry cough. Her pulmonary function testing has been done and I will obtain today. She denies any other symptoms consistent with obstruction.  We will continue the Anoro for at least another 2 weeks. Please call us at that time to let us know if you feel that it is benefiting breathing. Also we would like to know how much is making you cough. If the cough worsens then it may be reasonable to discuss stopping  the Anoro early Dr. Lamonte Sakai will review your pulmonary function testing and chest x-ray results Try to slowly restart your walking routine improve your cardiopulmonary endurance Follow with Dr Lamonte Sakai in 3-4 weeks to review your status, plan next steps.   Baltazar Apo, MD, PhD 04/06/2017, 9:46 AM [facility] [phone] or if no answer [phone]

## 2017-04-06 NOTE — Patient Instructions (Signed)
We will continue the Anoro for at least another 2 weeks. Please call us at that time to let us know if you feel that it is benefiting breathing. Also we would like to know how much is making you cough. If the cough worsens then it may be reasonable to discuss stopping the Anoro early Dr. Lamonte Sakai will review your pulmonary function testing and chest x-ray results Try to slowly restart your walking routine improve your cardiopulmonary endurance Follow with Dr Lamonte Sakai in 3-4 weeks to review your status, plan next steps.

## 2017-04-06 NOTE — Assessment & Plan Note (Signed)
Based on her description I am most suspicious that she is experiencing restrictive as etiology, multifactorial due to some mild kyphosis and wearing a back brace due to her back discomfort, restricted diaphragmatic excursion after eating. Also some degree of deconditioning because she has had to curtail her exercise routine, mainly due to back discomfort. Her chest x-ray was hyperinflated and empiric Anoro was started. Not clear that she has benefited but she hasn't been on it long enough to reassess. She has had the side effect of dry cough. Her pulmonary function testing has been done and I will obtain today. She denies any other symptoms consistent with obstruction.  We will continue the Anoro for at least another 2 weeks. Please call us at that time to let us know if you feel that it is benefiting breathing. Also we would like to know how much is making you cough. If the cough worsens then it may be reasonable to discuss stopping the Anoro early Dr. Lamonte Sakai will review your pulmonary function testing and chest x-ray results Try to slowly restart your walking routine improve your cardiopulmonary endurance Follow with Dr Lamonte Sakai in 3-4 weeks to review your status, plan next steps.

## 2017-04-07 DIAGNOSIS — H0289 Other specified disorders of eyelid: Secondary | ICD-10-CM | POA: Diagnosis not present

## 2017-04-07 DIAGNOSIS — H04123 Dry eye syndrome of bilateral lacrimal glands: Secondary | ICD-10-CM | POA: Diagnosis not present

## 2017-04-07 DIAGNOSIS — Z961 Presence of intraocular lens: Secondary | ICD-10-CM | POA: Diagnosis not present

## 2017-04-07 DIAGNOSIS — L718 Other rosacea: Secondary | ICD-10-CM | POA: Diagnosis not present

## 2017-04-13 DIAGNOSIS — F329 Major depressive disorder, single episode, unspecified: Secondary | ICD-10-CM | POA: Diagnosis not present

## 2017-04-13 DIAGNOSIS — R05 Cough: Secondary | ICD-10-CM | POA: Diagnosis not present

## 2017-04-13 DIAGNOSIS — Z6829 Body mass index (BMI) 29.0-29.9, adult: Secondary | ICD-10-CM | POA: Diagnosis not present

## 2017-04-13 DIAGNOSIS — J449 Chronic obstructive pulmonary disease, unspecified: Secondary | ICD-10-CM | POA: Diagnosis not present

## 2017-04-28 ENCOUNTER — Encounter: Payer: Self-pay | Admitting: Emergency Medicine

## 2017-04-28 ENCOUNTER — Ambulatory Visit (INDEPENDENT_AMBULATORY_CARE_PROVIDER_SITE_OTHER): Payer: Medicare Other | Admitting: Emergency Medicine

## 2017-04-28 DIAGNOSIS — R0602 Shortness of breath: Secondary | ICD-10-CM

## 2017-04-28 NOTE — Assessment & Plan Note (Signed)
I do not have her PFT results available. We will try to get these. Based on clinical history I do not highly suspect obstructive lung disease. She is unsure whether she benefited from the [facility], thinks that maybe she has a little bit. I like to stop it and see if she misses it. If so then we will workup obstructive disease further. I've encouraged her to continue to walk, exercise. I think the deconditioning and restrictive lung disease are the biggest contributors to her dyspnea.

## 2017-04-28 NOTE — Patient Instructions (Addendum)
We will stop Anoro. Please keep track of how your breathing does off of this medication.  Agree with increasing your pantoprazole if your acid reflux persists.  Continue your exercise regimen. Try to walk every day.  Call our office to let us know if you miss the Anoro. If so then Dr Lamonte Sakai would like to see you back in the office.

## 2017-04-28 NOTE — Progress Notes (Signed)
Subjective:    Patient ID: Brenda [name], female    DOB: July 26, 1932, 83 y.o.   MRN: [id number]  Shortness of Breath  Associated symptoms include wheezing. Pertinent negatives include no ear pain, fever, headaches, leg swelling, rash, rhinorrhea, sore throat or vomiting.   83 yo woman, never smoker, hx of CLL, anxiety, GERD, freq UTI's w abx (not nitrofurantoin). She is referred for dyspnea. She states that over the last 2-3 months she has noticed an increased exertional SOB. She notes that her walking routine has decreased over the last several months. More SOB with bending after eating. She has chronic back pain. She had PFT done, not in our system and not available to me yet. she had a normal CXR w ? Hyperinflation per her report.  She was started on Anoro empirically earlier a week - hasn't changed her breathing, has made her cough some. Non-productive. No wheeze, no CP.   She has a back brace, started using it several months ago, her husband believes for a year  ROV 04/28/17 -- This follow-up visit for patient with a history of multifactorial dyspnea.  Likely components of restriction, possibly obstruction based on hyperinflation of her chest x-ray. She believes that her exertional tolerance is a bit better, able to walk on flat ground. She has not tried household chores, etc. She has been on Anoro for 3+ weeks.    Review of Systems  Constitutional: Negative for fever and unexpected weight change.  HENT: Negative for congestion, dental problem, ear pain, nosebleeds, postnasal drip, rhinorrhea, sinus pressure, sneezing, sore throat and trouble swallowing.   Eyes: Negative for redness and itching.  Respiratory: Positive for cough, shortness of breath and wheezing. Negative for chest tightness.   Cardiovascular: Negative for palpitations and leg swelling.  Gastrointestinal: Negative for nausea and vomiting.  Genitourinary: Negative for dysuria.  Musculoskeletal: Negative for joint swelling.    Skin: Negative for rash.  Neurological: Negative for headaches.  Hematological: Does not bruise/bleed easily.  Psychiatric/Behavioral: Negative for dysphoric mood. The patient is not nervous/anxious.               Past Medical History:  Diagnosis Date  . Anxiety   . Arthritis   . CLL (chronic lymphocytic leukemia) ([place])   . Complication of anesthesia   . Depression   . Depression with anxiety   . Diverticulosis   . Dyspnea   . GERD (gastroesophageal reflux disease)   . Hearing loss   . Memory loss   . Mild cognitive impairment   . Osteopenia   . PONV (postoperative nausea and vomiting)   . Reflux   . SOB (shortness of breath)      Family History  Problem Relation Age of Onset  . Alzheimer's disease Father   . Atrial fibrillation Brother      Social History   Social History  . Marital status: Married    Spouse name: [name]  . Number of children: 2  . Years of education: college   Occupational History  . Not on file.   Social History Main Topics  . Smoking status: Never Smoker  . Smokeless tobacco: Never Used  . Alcohol use Yes     Comment: wine daily   . Drug use: No  . Sexual activity: Not on file   Other Topics Concern  . Not on file   Social History Narrative   Patient lives at home with husband [name].    Patient is retired.  Patient has a college education.    Patient has 2 children.    Patient drinks 2 cups of coffee daily.   worked at a PT, inhaled exposures.     Allergies  Allergen Reactions  . Adhesive [Tape] Rash  . Codeine Nausea And Vomiting  . Doxycycline Rash    Severe Reflux , cheek "flushing"  . Sulfa Antibiotics Rash     Outpatient Medications Prior to Visit  Medication Sig Dispense Refill  . atorvastatin (LIPITOR) 20 MG tablet Take 20 mg by mouth daily at 6 PM.    . cephALEXin (KEFLEX) 500 MG capsule Take 1 capsule by mouth 2 (two) times daily.    . citalopram (CELEXA) 10 MG tablet Take 1 tablet (10 mg total) by mouth  daily. 30 tablet 11  . estradiol (ESTRING) 2 MG vaginal ring Place 2 mg vaginally every 3 (three) months. follow package directions    . LORazepam (ATIVAN) 0.5 MG tablet Take 0.5 mg by mouth at bedtime.     . Multiple Vitamin (MULTIVITAMIN) capsule Take 1 capsule by mouth daily.    Marland Kitchen OVER THE COUNTER MEDICATION thera tears prn dry ears    . pantoprazole (PROTONIX) 40 MG tablet Take 40 mg by mouth daily.     Marland Kitchen umeclidinium-vilanterol (ANORO ELLIPTA) 62.5-25 MCG/INH AEPB Inhale 1 puff into the lungs daily.    Marland Kitchen ZETIA 10 MG tablet Take 10 mg by mouth at bedtime.     Marland Kitchen zolpidem (AMBIEN) 5 MG tablet Take 5 mg by mouth at bedtime.      No facility-administered medications prior to visit.         Objective:   Physical Exam Vitals:   04/28/17 1521  BP: 130/76  Pulse: 80  SpO2: 96%  Weight: 175 lb (79.4 kg)  Height: 5\' 6"  (1.676 m)   Gen: Pleasant, Overweight woman, in no distress,  normal affect  ENT: No lesions,  mouth clear,  oropharynx clear, no postnasal drip  Neck: No JVD, no stridor  Lungs: No use of accessory muscles, good air movement with no crackles or wheezes heard  Cardiovascular: RRR, heart sounds normal, no murmur or gallops, no peripheral edema  Musculoskeletal: No deformities, no cyanosis or clubbing  Neuro: alert, awake and interactive. Follows commands. Her memory is somewhat limited, she has to estimate symptoms and timing  Skin: Warm, no lesions or rashes     Assessment & Plan:  Dyspnea I do not have her PFT results available. We will try to get these. Based on clinical history I do not highly suspect obstructive lung disease. She is unsure whether she benefited from the [facility], thinks that maybe she has a little bit. I like to stop it and see if she misses it. If so then we will workup obstructive disease further. I've encouraged her to continue to walk, exercise. I think the deconditioning and restrictive lung disease are the biggest contributors to her  dyspnea.  Baltazar Apo, MD, PhD 04/28/2017, 3:55 PM [facility] [phone] or if no answer [phone]

## 2017-05-13 ENCOUNTER — Telehealth: Payer: Self-pay | Admitting: Emergency Medicine

## 2017-05-13 NOTE — Telephone Encounter (Signed)
Spoke with pt, she states her cough has improved and she thinks time was just needed to get better. She just wanted RB to know. I advised her to call us if her cough returned or if she needed our help. Pt agreed. FYI RB

## 2017-05-24 ENCOUNTER — Ambulatory Visit (INDEPENDENT_AMBULATORY_CARE_PROVIDER_SITE_OTHER): Payer: Medicare Other | Admitting: Psychology

## 2017-05-24 ENCOUNTER — Encounter: Payer: Self-pay | Admitting: Psychology

## 2017-05-24 DIAGNOSIS — R413 Other amnesia: Secondary | ICD-10-CM

## 2017-05-24 NOTE — Progress Notes (Signed)
   Neuropsychology Note  [name] came in today for 1 hour of neuropsychological testing with technician, Milana Kidney, BS, under the supervision of Dr. Macarthur Critchley. The patient did not appear overtly distressed by the testing session, per behavioral observation or via self-report to the technician. Rest breaks were offered. Brenda [name] will return within 2 weeks for a feedback session with Dr. Si Raider at which time her test performances, clinical impressions and treatment recommendations will be reviewed in detail. The patient understands she can contact our office should she require our assistance before this time.  Full report to follow.

## 2017-05-24 NOTE — Progress Notes (Signed)
NEUROPSYCHOLOGICAL INTERVIEW (CPT: [id number])  Name: Brenda [name] Date of Birth: Oct 04, 1931 Date of Interview: 05/24/2017  Reason for Referral:  Brenda [name] is a 83 y.o. right-handed female who is referred for neuropsychological evaluation by Dr. Crist [name] of [place] due to concerns about worsening memory. This patient is accompanied in the office by her husband, Brenda [name], who supplements the history.  History of Presenting Problem:  Brenda [name] was previously evaluated by Dr. Valentina [name] just over 5 years ago in July 2013. At that time she reported an approximate four year history of mild memory difficulties without any reported decrement in her everyday functioning. At that time she reported word finding difficulty and reduced ability to retain what she has read. Her neurocognitive profile was notable for a sole deficiency for delayed retrieval from memory of initially learned verbal information. Otherwise her neurocognitive profile was within normal expectations. She was diagnosed with probable mild cognitive impairment (amnestic type) although text anxiety could not be wholly discounted as a cause of her deficient memory retrieval.   The patient last saw Dr. Joylene [name] on 03/18/2017. She reported increasing anxiety. Celexa was changed to Pristiq (50 mg daily).   Since her last neuropsychological evaluation in 2013, the patient states that her memory problems have remained relatively stable; if there is any decline it is mild and is not affecting her daily functioning. She continues to manage all complex ADLs. Per her husband, she continues to demonstrate memory difficulties but no other cognitive problems. He states she may leave out details or mix up details of recent events/conversations. He characterizes her difficulty as one of short term memory recall.  Upon direct questioning, the patient and her husband also reported the following with regard to current cognitive functioning:     Misplacing/losing items: Yes Forgetting appointments or other obligations: No, uses calendar Forgetting to take medications: No, has a good system Forgetting to pay bills: Never took care of this  Difficulty concentrating: Possibly; when she is reading, she has to re-read things.   Starting but not finishing tasks: She has always kind of been that way Processing information more slowly: No  Word-finding difficulty: Not happening often Word substitutions: No Comprehension difficulty: No  Getting lost when driving: No Uncertain about directions when driving or passenger: She sometimes has to think a route through before she leaves, "maybe a tad longer to think of it."  Psychiatric history is significant for situational depression and anxiety. She also notices a pattern of seasonal affective disorder with more depression in the winter months. She denies any significant depression or anxiety at the present time.   There is a family history of dementia in her father who lived to 72 yo. She also reported family history of dementia in her paternal grandmother and maternal grandmother. There is a family history of schizophrenia in her maternal aunt who was institutionalized.   Current Functioning: The patient is retired and lives with her husband. As noted previously, she continues to manage all complex ADLs without any reported difficulty.   Physically, she notes that she has had much more difficulty in the past year. She has significant back pain, shortness of breath when walking, and chronic dry eyes.  She has never fallen. She does feel balance is somewhat reduced. She has to use a cane or walker at times when her back in really bothering her. She has less energy, but her back pain contributes to this.   She sleeps well. She notes she  has been on sleep medications (Ativan and Ambien) for many years, and she wonders if that could be contributing to her memory issues. She wants to talk more  about that at her follow-up appointment with me.   Social History: Education: Dietitian in psychology, 2 year graduate program in physical therapy Occupational history: Retired Physical therapist  Marital history: Married to current husband for 58 years. 2 daughters (19 and 33). 6 grandchildren. Alcohol: None recently. She only ever drank some wine, but never much. Tobacco: None   Medical History: Past Medical History:  Diagnosis Date  . Anxiety   . Arthritis   . CLL (chronic lymphocytic leukemia) ([place])   . Complication of anesthesia   . Depression   . Depression with anxiety   . Diverticulosis   . Dyspnea   . GERD (gastroesophageal reflux disease)   . Hearing loss   . Memory loss   . Mild cognitive impairment   . Osteopenia   . PONV (postoperative nausea and vomiting)   . Reflux   . SOB (shortness of breath)    Patient states, "Dr. Abner [name] really does not think I have CLL, my blood counts aren't showing it." Her brother and mother both had CLL.   Current Medications:  Outpatient Encounter Prescriptions as of 05/24/2017  Medication Sig  . atorvastatin (LIPITOR) 20 MG tablet Take 20 mg by mouth daily at 6 PM.  . cephALEXin (KEFLEX) 500 MG capsule Take 1 capsule by mouth 2 (two) times daily.  . citalopram (CELEXA) 10 MG tablet Take 1 tablet (10 mg total) by mouth daily.  Marland Kitchen estradiol (ESTRING) 2 MG vaginal ring Place 2 mg vaginally every 3 (three) months. follow package directions  . LORazepam (ATIVAN) 0.5 MG tablet Take 0.5 mg by mouth at bedtime.   . Multiple Vitamin (MULTIVITAMIN) capsule Take 1 capsule by mouth daily.  Marland Kitchen OVER THE COUNTER MEDICATION thera tears prn dry ears  . pantoprazole (PROTONIX) 40 MG tablet Take 40 mg by mouth daily.   Marland Kitchen umeclidinium-vilanterol (ANORO ELLIPTA) 62.5-25 MCG/INH AEPB Inhale 1 puff into the lungs daily.  Marland Kitchen ZETIA 10 MG tablet Take 10 mg by mouth at bedtime.   Marland Kitchen zolpidem (AMBIEN) 5 MG tablet Take 5 mg by mouth at bedtime.    No  facility-administered encounter medications on file as of 05/24/2017.    No longer on Celexa, now on generic Pristiq 50 mg daily Also taking: Omega 3 Preservision Vitamin D 2000 iu Turmeric cumin 500 mg Probiotic  Behavioral Observations:   Appearance: Neatly and appropriately dressed and groomed, appearing younger than her chronological age Gait: Ambulated independently, no gross abnormalities observed Speech: Fluent; normal rate, rhythm and volume. No significant word finding difficulty during conversational speech. Thought process: Linear, goal directed Affect: Full, euthymic, mildly anxious about testing Interpersonal: Pleasant, appropriate   TESTING: There is medical necessity to proceed with neuropsychological assessment as the results will be used to aid in differential diagnosis and clinical decision-making and to inform specific treatment recommendations. Per the patient, her husband and medical records reviewed, there has been a change in cognitive functioning since her last evaluation and a reasonable suspicion of neurocognitive disorder.  Following the clinical interview, the patient completed a full battery of neuropsychological testing with my psychometrician under my supervision.   PLAN: The patient will return to see me for a follow-up session at which time her test performances and my impressions and treatment recommendations will be reviewed in detail.  Full report to follow.

## 2017-05-31 DIAGNOSIS — H52203 Unspecified astigmatism, bilateral: Secondary | ICD-10-CM | POA: Diagnosis not present

## 2017-05-31 DIAGNOSIS — H01002 Unspecified blepharitis right lower eyelid: Secondary | ICD-10-CM | POA: Diagnosis not present

## 2017-05-31 DIAGNOSIS — H01001 Unspecified blepharitis right upper eyelid: Secondary | ICD-10-CM | POA: Diagnosis not present

## 2017-05-31 DIAGNOSIS — H01004 Unspecified blepharitis left upper eyelid: Secondary | ICD-10-CM | POA: Diagnosis not present

## 2017-06-17 NOTE — Progress Notes (Signed)
NEUROPSYCHOLOGICAL EVALUATION   Name:    Brenda [name]  Date of Birth:   1932-06-29 Date of Interview:  05/24/2017 Date of Testing:  05/24/2017   Date of Feedback:  06/22/2017       Background Information:  Reason for Referral:  Brenda [name] is a 83 y.o. right handed female referred by Dr. Crist Infante of [place] to assess her current level of cognitive functioning and assist in differential diagnosis. Brenda current evaluation consisted of a review of available medical records, an interview with Brenda [name] and her husband, [name], and Brenda completion of a neuropsychological testing battery. Informed consent was obtained.  History of Presenting Problem:  Brenda [name] was previously evaluated by Dr. Valentina Shaggy just over 5 years ago in July 2013. At that time Brenda [name] reported an approximate four year history of mild memory difficulties without any reported decrement in her everyday functioning. At that time Brenda [name] reported word finding difficulty and reduced ability to retain what Brenda [name] has read. Her neurocognitive profile was notable for a sole deficiency for delayed retrieval from memory of initially learned verbal information. Otherwise her neurocognitive profile was within normal expectations. Brenda [name] was diagnosed with probable mild cognitive impairment (amnestic type) although text anxiety could not be wholly discounted as a cause of her deficient memory retrieval.   Brenda [name] last saw Dr. Joylene Draft on 03/18/2017. Brenda [name] reported increasing anxiety. Celexa was changed to Pristiq (50 mg daily).   Since her last neuropsychological evaluation in 2013, Brenda [name] states that her memory problems have remained relatively stable; if there is any decline it is mild and is not affecting her daily functioning. Brenda [name] continues to manage all complex ADLs. Per her husband, Brenda [name] continues to demonstrate memory difficulties but no other cognitive problems. He states Brenda [name] may leave out details or mix up details  of recent events/conversations. He characterizes her difficulty as one of short term memory recall.  Upon direct questioning, Brenda [name] and her husband also reported Brenda following with regard to current cognitive functioning:   Misplacing/losing items: Yes Forgetting appointments or other obligations: No, uses calendar Forgetting to take medications: No, has a good system Forgetting to pay bills: Never took care of this  Difficulty concentrating: Possibly; when Brenda [name] is reading, Brenda [name] has to re-read things.   Starting but not finishing tasks: Brenda [name] has always kind of been that way Processing information more slowly: No  Word-finding difficulty: Not happening often Word substitutions: No Comprehension difficulty: No  Getting lost when driving: No Uncertain about directions when driving or passenger: Brenda [name] sometimes has to think a route through before Brenda [name] leaves, "maybe a tad longer to think of it."  Psychiatric history is significant for situational depression and anxiety. Brenda [name] also notices a pattern of seasonal affective disorder with more depression in Brenda winter months. Brenda [name] denies any significant depression or anxiety at Brenda present time.   There is a family history of dementia in her father who lived to 40 yo. Brenda [name] also reported family history of dementia in her paternal grandmother and maternal grandmother. There is a family history of schizophrenia in her maternal aunt who was institutionalized.   Current Functioning: Brenda [name] is retired and lives with her husband. As noted previously, Brenda [name] continues to manage all complex ADLs without any reported difficulty.   Physically, Brenda [name] notes that Brenda [name] has had much more difficulty in Brenda past year. Brenda [name] has significant back pain, shortness of breath when walking, and chronic dry eyes.  Brenda [name]  has never fallen. Brenda [name] does feel balance is somewhat reduced. Brenda [name] has to use a cane or walker at times when her back in really bothering her. Brenda [name] has  less energy, but her back pain contributes to this.   Brenda [name] sleeps well. Brenda [name] notes Brenda [name] has been on sleep medications (Ativan and Ambien) for many years, and Brenda [name] wonders if that could be contributing to her memory issues.    Social History: Education: Dietitian in psychology, 2 year graduate program in physical therapy Occupational history: Retired Physical therapist  Marital history: Married to current husband for 68 years. 2 daughters (23 and 16). 6 grandchildren. Alcohol: None recently. Brenda [name] only ever drank some wine, but never much. Tobacco: None  Medical History:  Past Medical History:  Diagnosis Date  . Anxiety   . Arthritis   . CLL (chronic lymphocytic leukemia) ([place])   . Complication of anesthesia   . Depression   . Depression with anxiety   . Diverticulosis   . Dyspnea   . GERD (gastroesophageal reflux disease)   . Hearing loss   . Memory loss   . Mild cognitive impairment   . Osteopenia   . PONV (postoperative nausea and vomiting)   . Reflux   . SOB (shortness of breath)   [name] states, "Dr. Abner Greenspan really does not think I have CLL, my blood counts aren't showing it." Her brother and mother both had CLL.   Current medications:  Outpatient Encounter Prescriptions as of 06/22/2017  Medication Sig  . atorvastatin (LIPITOR) 20 MG tablet Take 20 mg by mouth daily at 6 PM.  . cephALEXin (KEFLEX) 500 MG capsule Take 1 capsule by mouth 2 (two) times daily.  . citalopram (CELEXA) 10 MG tablet Take 1 tablet (10 mg total) by mouth daily.  Marland Kitchen estradiol (ESTRING) 2 MG vaginal ring Place 2 mg vaginally every 3 (three) months. follow package directions  . LORazepam (ATIVAN) 0.5 MG tablet Take 0.5 mg by mouth at bedtime.   . Multiple Vitamin (MULTIVITAMIN) capsule Take 1 capsule by mouth daily.  Marland Kitchen OVER Brenda COUNTER MEDICATION thera tears prn dry ears  . pantoprazole (PROTONIX) 40 MG tablet Take 40 mg by mouth daily.   Marland Kitchen umeclidinium-vilanterol (ANORO ELLIPTA) 62.5-25  MCG/INH AEPB Inhale 1 puff into Brenda lungs daily.  Marland Kitchen ZETIA 10 MG tablet Take 10 mg by mouth at bedtime.   Marland Kitchen zolpidem (AMBIEN) 5 MG tablet Take 5 mg by mouth at bedtime.    No facility-administered encounter medications on file as of 06/22/2017.    No longer on Celexa, now on generic Pristiq 50 mg daily Also taking: Omega 3 Preservision Vitamin D 2000 iu Turmeric cumin 500 mg Probiotic   Current Examination:  Behavioral Observations:  Appearance: Neatly and appropriately dressed and groomed, appearing younger than her chronological age Gait: Ambulated independently, no gross abnormalities observed Speech: Fluent; normal rate, rhythm and volume. No significant word finding difficulty during conversational speech. Thought process: Linear, goal directed Affect: Full, euthymic, mildly anxious about testing Interpersonal: Pleasant, appropriate Orientation: Oriented to all spheres. Accurately named Brenda current President and his predecessor.   Tests Administered: . Test of Premorbid Functioning (TOPF) . Wechsler Adult Intelligence Scale-Fourth Edition (WAIS-IV): Similarities, Music therapist, Coding and Digit Span subtests . Wechsler Memory Scale-Fourth Edition (WMS-IV) Older Adult Version (ages 32-90): Logical Memory I, II and Recognition subtests  . [employer] Verbal Learning Test - 2nd Edition (CVLT-2) Short Form . Repeatable Battery for Brenda Assessment of Neuropsychological Status (RBANS) Form A:  Figure Copy and Recall subtests and Semantic Fluency subtest . [place] (BNT) . [place] Diagnostic Aphasia Examination: Complex Ideational Material subtest . Controlled Oral Word Association Test (COWAT) . Trail Making Test A and B . Clock drawing test . Geriatric Depression Scale (GDS) 15 Item . Generalized Anxiety Disorder - 7 item screener (GAD-7)  Test Results: Note: Standardized scores are presented only for use by appropriately trained professionals and to allow for any future  test-retest comparison. These scores should not be interpreted without consideration of all Brenda information that is contained in Brenda rest of Brenda report. Brenda most recent standardization samples from Brenda test publisher or other sources were used whenever possible to derive standard scores; scores were corrected for age, gender, ethnicity and education when available.   Test Scores:  Test Name Raw Score Standardized Score Descriptor  TOPF 51/70 SS= 109 Average  WAIS-IV Subtests     Similarities 19/36 ss= 9 Average  Block Design 29/66 ss= 11 Average  Coding 58/135 ss= 14 Superior  Digit Span Forward 8/16 ss= 8 Average  Digit Span Backward 7/16 ss= 10 Average  WMS-IV Subtests     LM I 26/53 ss= 9 Average  LM II 10/39 ss= 9 Average  LM II Recognition 17/23 Cum %: 26-50 WNL  RBANS Subtests     Figure Copy 19/20 Z= 0.9 Average  Figure Recall 8/20 Z= -0.8 Low average  Semantic Fluency 15 Z= -0.7 Average  CVLT-II Scores     Trial 1 4/9 Z= -1 Low average  Trial 4 8/9 Z= 0.5 Average  Trials 1-4 total 25/36 T= 50 Average  SD Free Recall 5/9 Z= -1 Low average  LD Free Recall 3/9 Z= -1.5 Borderline  LD Cued Recall 5/9 Z= -1 Low average  Recognition Discriminability 8/9 hits, 2 false positives Z= 0 Average  Forced Choice Recognition 9/9  WNL  BNT 56/60 T= 57 High average  BDAE Complex Ideational Material 12/12  WNL  COWAT-FAS 42 T= 54 Average  COWAT-Animals 18 T= 54 Average  Trail Making Test A  44" 0 errors T= 56 Average  Trail Making Test B  117" 0 errors T= 54 Average  Clock Drawing   WNL  GDS-15 2/15  WNL  GAD-7 2/21  WNL      Description of Test Results:  Premorbid verbal intellectual abilities were estimated to have been within Brenda average to high average range based on a test of word reading. Psychomotor processing speed was superior. Auditory attention and working memory were average. Visual-spatial construction was average. Language abilities were intact. Specifically,  confrontation naming was high average, and semantic verbal fluency was average. Auditory comprehension of complex ideational material was intact. With regard to verbal memory, encoding and acquisition of non-contextual information (i.e., word list) was average. After a brief distracter task, free recall was low average (5/9 items recalled). After a delay, free recall was borderline (3/9 items recalled). Cued recall was low average (5/9 items recalled). Performance on a yes/no recognition task was average. On another verbal memory test, encoding and acquisition of contextual auditory information (i.e., short stories) was average. After a delay, free recall was average. Performance on a yes/no recognition task was normal. With regard to non-verbal memory, delayed free recall of visual information was low average. Executive functioning was intact overall. Mental flexibility and set-shifting were average on Trails B. Verbal fluency with phonemic search restrictions was average. Verbal abstract reasoning was average. Performance on a clock drawing task was normal. On  self-report measures of mood, Brenda [name]'s responses were not suggestive of clinically significant depression or anxiety at Brenda present time.    Clinical Impressions: Amnestic mild cognitive impairment, with no interval decline relative to 2013 evaluation. Results of cognitive testing were largely within normal limits and commensurate with performances in 2013. There is no sign of cognitive decline relative to her last evaluation. Brenda [name] does continue to demonstrate a mild weakness in retrieval of verbal information after a delay, but is aided by semantic cueing, suggesting more of a frontal-subcortical retrieval problem than hippocampal consolidation dysfunction. Brenda [name] continues to meet diagnostic criteria for MCI, but not dementia. Brenda [name] denies significant depression or anxiety at Brenda present time.    Recommendations/Plan: Based on Brenda findings of Brenda  present evaluation, Brenda following recommendations are offered:  1. Brenda [name] has concerns about cognitive effects of Ambien and Ativan. I encouraged her to speak with her prescribing physician. I don't have significant concerns, but it is possible that these medications could be contributing to her mild memory difficulty, and Brenda [name] could consider weaning off one or both of them to see how it affects her sleep. It was noted that poor sleep can also impact memory, so there is some cost/benefit analysis when it comes to stopping such medications. Brenda [name] will discuss this further with her PCP. 2. Additional information regarding MCI was provided. Her test performances are stable since her last evaluation 5 years ago, which is reassuring. Her MCI does NOT likely represent underlying neurodegenerative condition.  3. Brenda [name] should continue to participate in activities that provide mental stimulation, social interaction and safe cardiovascular exercise. 4. Brenda [name] should continue to use external aids (notes, reminders) to assist in memory recall. Repetition and association of information are also helpful tools in aiding later recall.   Feedback to [name]: LINDYN VOSSLER and her husband returned for a feedback appointment on 06/22/2017 to review Brenda results of her neuropsychological evaluation with this provider. 20 minutes face-to-face time was spent reviewing her test results, my impressions and my recommendations as detailed above.    Total time spent on this [name]'s case: [id number] unit for interview with psychologist; [phone] units of testing by psychometrician under psychologist's supervision; [phone] units for medical record review, scoring of neuropsychological tests, interpretation of test results, preparation of this report, and review of results to Brenda [name] by psychologist.      Thank you for your referral of [place]. Please feel free to contact me if you have any questions or concerns regarding  this report.

## 2017-06-22 ENCOUNTER — Ambulatory Visit (INDEPENDENT_AMBULATORY_CARE_PROVIDER_SITE_OTHER): Payer: Medicare Other | Admitting: Psychology

## 2017-06-22 ENCOUNTER — Encounter: Payer: Self-pay | Admitting: Psychology

## 2017-06-22 DIAGNOSIS — G3184 Mild cognitive impairment, so stated: Secondary | ICD-10-CM

## 2017-06-22 DIAGNOSIS — Z23 Encounter for immunization: Secondary | ICD-10-CM | POA: Diagnosis not present

## 2017-06-22 DIAGNOSIS — R413 Other amnesia: Secondary | ICD-10-CM

## 2017-06-22 NOTE — Patient Instructions (Signed)
  Results of cognitive testing were largely within normal limits and commensurate with performances in 2013. There is no sign of cognitive decline relative to her last evaluation. She does continue to demonstrate a mild weakness in retrieval of verbal information after a delay, but is aided by semantic cueing. Given the lack of interval decline relative to 2013 evaluation, her memory difficulties likely do NOT represent underlying neurodegenerative disorder. She does NOT meet diagnostic criteria for dementia.    Recommendations/Plan: Based on the findings of the present evaluation, the following recommendations are offered:  The patient has concerns about cognitive effects of Ambien and Ativan. I encouraged her to speak with her prescribing physician. It is possible that these medications could be contributing to her mild memory difficulty, and she could consider weaning off one or both of them to see how it affects her sleep. It was noted that poor sleep can also impact memory, so there is some cost/benefit analysis when it comes to stopping such medications. She will discuss this further with her PCP.  She should continue to participate in activities that provide mental stimulation, social interaction and safe cardiovascular exercise.  She should continue to use external aids (notes, reminders) to assist in memory recall. Repetition and association of information are also helpful tools in aiding later recall.

## 2017-06-24 DIAGNOSIS — H0012 Chalazion right lower eyelid: Secondary | ICD-10-CM | POA: Diagnosis not present

## 2017-06-25 DIAGNOSIS — M47816 Spondylosis without myelopathy or radiculopathy, lumbar region: Secondary | ICD-10-CM | POA: Diagnosis not present

## 2017-06-25 DIAGNOSIS — M25552 Pain in left hip: Secondary | ICD-10-CM | POA: Diagnosis not present

## 2017-06-28 DIAGNOSIS — M1711 Unilateral primary osteoarthritis, right knee: Secondary | ICD-10-CM | POA: Diagnosis not present

## 2017-07-13 DIAGNOSIS — H0012 Chalazion right lower eyelid: Secondary | ICD-10-CM | POA: Diagnosis not present

## 2017-08-04 DIAGNOSIS — H02889 Meibomian gland dysfunction of unspecified eye, unspecified eyelid: Secondary | ICD-10-CM | POA: Diagnosis not present

## 2017-08-04 DIAGNOSIS — Z961 Presence of intraocular lens: Secondary | ICD-10-CM | POA: Diagnosis not present

## 2017-08-04 DIAGNOSIS — L718 Other rosacea: Secondary | ICD-10-CM | POA: Diagnosis not present

## 2017-08-12 DIAGNOSIS — L821 Other seborrheic keratosis: Secondary | ICD-10-CM | POA: Diagnosis not present

## 2017-08-12 DIAGNOSIS — Z85828 Personal history of other malignant neoplasm of skin: Secondary | ICD-10-CM | POA: Diagnosis not present

## 2017-08-12 DIAGNOSIS — L570 Actinic keratosis: Secondary | ICD-10-CM | POA: Diagnosis not present

## 2017-08-12 DIAGNOSIS — D692 Other nonthrombocytopenic purpura: Secondary | ICD-10-CM | POA: Diagnosis not present

## 2017-08-12 DIAGNOSIS — D485 Neoplasm of uncertain behavior of skin: Secondary | ICD-10-CM | POA: Diagnosis not present

## 2017-08-12 DIAGNOSIS — L820 Inflamed seborrheic keratosis: Secondary | ICD-10-CM | POA: Diagnosis not present

## 2017-08-12 DIAGNOSIS — C44619 Basal cell carcinoma of skin of left upper limb, including shoulder: Secondary | ICD-10-CM | POA: Diagnosis not present

## 2017-08-26 DIAGNOSIS — R3982 Chronic bladder pain: Secondary | ICD-10-CM | POA: Diagnosis not present

## 2017-08-26 DIAGNOSIS — R300 Dysuria: Secondary | ICD-10-CM | POA: Diagnosis not present

## 2017-09-03 DIAGNOSIS — M5136 Other intervertebral disc degeneration, lumbar region: Secondary | ICD-10-CM | POA: Diagnosis not present

## 2017-09-03 DIAGNOSIS — M4186 Other forms of scoliosis, lumbar region: Secondary | ICD-10-CM | POA: Diagnosis not present

## 2017-09-03 DIAGNOSIS — M810 Age-related osteoporosis without current pathological fracture: Secondary | ICD-10-CM | POA: Diagnosis not present

## 2017-09-03 DIAGNOSIS — M545 Low back pain: Secondary | ICD-10-CM | POA: Diagnosis not present

## 2017-09-09 DIAGNOSIS — H0012 Chalazion right lower eyelid: Secondary | ICD-10-CM | POA: Diagnosis not present

## 2017-09-09 DIAGNOSIS — H01001 Unspecified blepharitis right upper eyelid: Secondary | ICD-10-CM | POA: Diagnosis not present

## 2017-09-14 ENCOUNTER — Ambulatory Visit: Payer: Medicare Other | Admitting: Neurology

## 2017-09-27 DIAGNOSIS — H02886 Meibomian gland dysfunction of left eye, unspecified eyelid: Secondary | ICD-10-CM | POA: Diagnosis not present

## 2017-09-27 DIAGNOSIS — H02883 Meibomian gland dysfunction of right eye, unspecified eyelid: Secondary | ICD-10-CM | POA: Diagnosis not present

## 2017-09-27 DIAGNOSIS — L718 Other rosacea: Secondary | ICD-10-CM | POA: Diagnosis not present

## 2017-09-27 DIAGNOSIS — Z961 Presence of intraocular lens: Secondary | ICD-10-CM | POA: Diagnosis not present

## 2017-09-28 DIAGNOSIS — M47816 Spondylosis without myelopathy or radiculopathy, lumbar region: Secondary | ICD-10-CM | POA: Diagnosis not present

## 2017-09-29 DIAGNOSIS — Z6829 Body mass index (BMI) 29.0-29.9, adult: Secondary | ICD-10-CM | POA: Diagnosis not present

## 2017-09-29 DIAGNOSIS — J3489 Other specified disorders of nose and nasal sinuses: Secondary | ICD-10-CM | POA: Diagnosis not present

## 2017-09-29 DIAGNOSIS — R040 Epistaxis: Secondary | ICD-10-CM | POA: Diagnosis not present

## 2017-10-07 ENCOUNTER — Ambulatory Visit: Payer: Medicare Other | Admitting: Cardiovascular Disease

## 2017-10-12 DIAGNOSIS — R8279 Other abnormal findings on microbiological examination of urine: Secondary | ICD-10-CM | POA: Diagnosis not present

## 2017-10-12 DIAGNOSIS — R300 Dysuria: Secondary | ICD-10-CM | POA: Diagnosis not present

## 2017-10-14 ENCOUNTER — Other Ambulatory Visit: Payer: Self-pay | Admitting: Internal Medicine

## 2017-10-14 DIAGNOSIS — Z1231 Encounter for screening mammogram for malignant neoplasm of breast: Secondary | ICD-10-CM

## 2017-10-15 DIAGNOSIS — J00 Acute nasopharyngitis [common cold]: Secondary | ICD-10-CM | POA: Diagnosis not present

## 2017-10-15 DIAGNOSIS — J324 Chronic pansinusitis: Secondary | ICD-10-CM | POA: Diagnosis not present

## 2017-10-26 DIAGNOSIS — M5136 Other intervertebral disc degeneration, lumbar region: Secondary | ICD-10-CM | POA: Diagnosis not present

## 2017-10-27 DIAGNOSIS — R8279 Other abnormal findings on microbiological examination of urine: Secondary | ICD-10-CM | POA: Diagnosis not present

## 2017-10-27 DIAGNOSIS — N3020 Other chronic cystitis without hematuria: Secondary | ICD-10-CM | POA: Diagnosis not present

## 2017-10-29 DIAGNOSIS — E7849 Other hyperlipidemia: Secondary | ICD-10-CM | POA: Diagnosis not present

## 2017-10-29 DIAGNOSIS — M859 Disorder of bone density and structure, unspecified: Secondary | ICD-10-CM | POA: Diagnosis not present

## 2017-10-29 DIAGNOSIS — R82998 Other abnormal findings in urine: Secondary | ICD-10-CM | POA: Diagnosis not present

## 2017-10-29 DIAGNOSIS — R7301 Impaired fasting glucose: Secondary | ICD-10-CM | POA: Diagnosis not present

## 2017-11-10 ENCOUNTER — Ambulatory Visit
Admission: RE | Admit: 2017-11-10 | Discharge: 2017-11-10 | Disposition: A | Discharge status: Home / Self Care | Payer: Medicare Other | Source: Ambulatory Visit | Attending: Internal Medicine | Admitting: Internal Medicine

## 2017-11-10 DIAGNOSIS — Z1231 Encounter for screening mammogram for malignant neoplasm of breast: Secondary | ICD-10-CM | POA: Diagnosis not present

## 2017-11-15 DIAGNOSIS — Z0000 Encounter for general adult medical examination without abnormal findings: Secondary | ICD-10-CM | POA: Diagnosis not present

## 2017-11-15 DIAGNOSIS — H00015 Hordeolum externum left lower eyelid: Secondary | ICD-10-CM | POA: Diagnosis not present

## 2017-11-15 DIAGNOSIS — R7301 Impaired fasting glucose: Secondary | ICD-10-CM | POA: Diagnosis not present

## 2017-11-15 DIAGNOSIS — Z1389 Encounter for screening for other disorder: Secondary | ICD-10-CM | POA: Diagnosis not present

## 2017-11-15 DIAGNOSIS — J449 Chronic obstructive pulmonary disease, unspecified: Secondary | ICD-10-CM | POA: Diagnosis not present

## 2017-11-15 DIAGNOSIS — R05 Cough: Secondary | ICD-10-CM | POA: Diagnosis not present

## 2017-11-15 DIAGNOSIS — R413 Other amnesia: Secondary | ICD-10-CM | POA: Diagnosis not present

## 2017-11-15 DIAGNOSIS — M48061 Spinal stenosis, lumbar region without neurogenic claudication: Secondary | ICD-10-CM | POA: Diagnosis not present

## 2017-11-15 DIAGNOSIS — C9100 Acute lymphoblastic leukemia not having achieved remission: Secondary | ICD-10-CM | POA: Diagnosis not present

## 2017-11-15 DIAGNOSIS — Z6829 Body mass index (BMI) 29.0-29.9, adult: Secondary | ICD-10-CM | POA: Diagnosis not present

## 2017-11-15 DIAGNOSIS — R197 Diarrhea, unspecified: Secondary | ICD-10-CM | POA: Diagnosis not present

## 2017-11-15 DIAGNOSIS — F3289 Other specified depressive episodes: Secondary | ICD-10-CM | POA: Diagnosis not present

## 2017-11-18 DIAGNOSIS — Z1212 Encounter for screening for malignant neoplasm of rectum: Secondary | ICD-10-CM | POA: Diagnosis not present

## 2017-11-22 DIAGNOSIS — R300 Dysuria: Secondary | ICD-10-CM | POA: Diagnosis not present

## 2017-11-22 DIAGNOSIS — N3020 Other chronic cystitis without hematuria: Secondary | ICD-10-CM | POA: Diagnosis not present

## 2017-12-10 DIAGNOSIS — N3946 Mixed incontinence: Secondary | ICD-10-CM | POA: Diagnosis not present

## 2017-12-10 DIAGNOSIS — N3000 Acute cystitis without hematuria: Secondary | ICD-10-CM | POA: Diagnosis not present

## 2017-12-10 DIAGNOSIS — R350 Frequency of micturition: Secondary | ICD-10-CM | POA: Diagnosis not present

## 2017-12-14 ENCOUNTER — Ambulatory Visit (INDEPENDENT_AMBULATORY_CARE_PROVIDER_SITE_OTHER): Payer: Medicare Other | Admitting: Cardiovascular Disease

## 2017-12-14 ENCOUNTER — Encounter: Payer: Self-pay | Admitting: Cardiovascular Disease

## 2017-12-14 VITALS — BP 122/64 | HR 72 | Ht 66.0 in | Wt 173.0 lb

## 2017-12-14 DIAGNOSIS — R0602 Shortness of breath: Secondary | ICD-10-CM

## 2017-12-14 NOTE — Progress Notes (Signed)
Cardiology Office Note    Date:  12/14/2017   ID:  Brenda [name], DOB 03-21-1932, MRN [id number]  PCP:  Crist Infante, MD  Cardiologist:   Sanda Klein, MD  Chief complaint: Dyspnea at rest   History of Present Illness:  Brenda [name] is a 84 y.o. female complains of occasional dyspnea at rest.  She has noticed that it is often most prominent after a big meal and when she bends over.  No association with physical activity.  She does not have edema or other signs of volume overload.  Previous cardiac workup is generally benign.  Symptoms have not progressed since last year's appointment.  Repeat echo on Jun 18 2016 showed normal LVEF and equivocal evidence for diastolic heart failure.There is no LVH. The left atrium is borderline dilated at 38 mm. The Doppler parameters were borderline for elevated mean LA pressure and the systolic PAP was mildly elevated.. Her event monitor was essentially normal, except for 3 episodes of 4-beat atrial tachycardia. nuclear stress test from 2012  normal.  The patient specifically denies any chest pain at rest or exertion, dyspnea at rest  with exertion, orthopnea, paroxysmal nocturnal dyspnea, syncope, palpitations, focal neurological deficits, intermittent claudication, lower extremity edema, unexplained weight gain, cough, hemoptysis or wheezing.  If she forgets to take her proton pump inhibitor she promptly develops symptoms of acid reflux.  She is taking a long course of azithromycin for blepharitis   Past Medical History:  Diagnosis Date  . Anxiety   . Arthritis   . CLL (chronic lymphocytic leukemia) ([place])   . Complication of anesthesia   . Depression   . Depression with anxiety   . Diverticulosis   . Dyspnea   . GERD (gastroesophageal reflux disease)   . Hearing loss   . Memory loss   . Mild cognitive impairment   . Osteopenia   . PONV (postoperative nausea and vomiting)   . Reflux   . SOB (shortness of breath)     Past Surgical  History:  Procedure Laterality Date  . ABDOMINAL HYSTERECTOMY    . APPENDECTOMY    . BREAST EXCISIONAL BIOPSY Left   . CARDIAC EVENT MONITOR  02/03/2005   30 days, normal event monitor  . CATARACT EXTRACTION Bilateral   . EYE SURGERY     squamous cell carcinoma surgery in right eye lid.  Marland Kitchen KNEE ARTHROSCOPY Right 07/02/2016   Procedure: ARTHROSCOPY KNEE WITH DEBRIDEMENT, PARTIAL LATERAL AND MEDIAL MENISCECTOMY, with drainage of cyst;  Surgeon: Susa Day, MD;  Location: [facility];  Service: Orthopedics;  Laterality: Right;  . NM GATED MYOCARDIAL STUDY (ARMX HX)  01/23/2011   Normal pattern of perfusion in all regions. post-stress EF is 85%. EKG negative for ischemia. no ECG changes.  . TONSILLECTOMY    . TRANSTHORACIC ECHOCARDIOGRAM  10/31/2010   EF >55%, there is concern for inferoacpical infarct with possible inferoapical aneurysm or pseudoaneurysm - could be echo artificat    Current Medications: Outpatient Medications Prior to Visit  Medication Sig Dispense Refill  . atorvastatin (LIPITOR) 20 MG tablet Take 20 mg by mouth daily at 6 PM.    . azithromycin (ZITHROMAX) 250 MG tablet Take one pill by mouth Monday, Wednesday, and Friday    . citalopram (CELEXA) 10 MG tablet Take 1 tablet (10 mg total) by mouth daily. 30 tablet 11  . desvenlafaxine (PRISTIQ) 50 MG 24 hr tablet desvenlafaxine succinate ER 50 mg tablet,extended release 24 hr    . estradiol (ESTRING)  2 MG vaginal ring Place 2 mg vaginally every 3 (three) months. follow package directions    . LORazepam (ATIVAN) 0.5 MG tablet Take 0.5 mg by mouth at bedtime.     . Multiple Vitamin (MULTIVITAMIN) capsule Take 1 capsule by mouth daily.    . Multiple Vitamins-Minerals (PRESERVISION AREDS 2+MULTI VIT PO) PreserVision AREDS    . OVER THE COUNTER MEDICATION thera tears prn dry ears    . pantoprazole (PROTONIX) 40 MG tablet Take 40 mg by mouth daily.     . ranitidine (ZANTAC) 150 MG tablet ranitidine 150 mg tablet    .  umeclidinium-vilanterol (ANORO ELLIPTA) 62.5-25 MCG/INH AEPB Inhale 1 puff into the lungs daily.    Dema Severin Petrolatum-Mineral Oil (SYSTANE NIGHTTIME) OINT Place into both eyes nightly.    Marland Kitchen ZETIA 10 MG tablet Take 10 mg by mouth at bedtime.     Marland Kitchen zolpidem (AMBIEN) 5 MG tablet Take 5 mg by mouth at bedtime.     . cephALEXin (KEFLEX) 500 MG capsule Take 1 capsule by mouth 2 (two) times daily.     No facility-administered medications prior to visit.      Allergies:   Adhesive [tape]; Codeine; Doxycycline; and Sulfa antibiotics   Social History   Socioeconomic History  . Marital status: Married    Spouse name: [name]  . Number of children: 2  . Years of education: college  . Highest education level: Not on file  Occupational History  . Not on file  Social Needs  . Financial resource strain: Not on file  . Food insecurity:    Worry: Not on file    Inability: Not on file  . Transportation needs:    Medical: Not on file    Non-medical: Not on file  Tobacco Use  . Smoking status: Never Smoker  . Smokeless tobacco: Never Used  Substance and Sexual Activity  . Alcohol use: No  . Drug use: No  . Sexual activity: Not on file  Lifestyle  . Physical activity:    Days per week: Not on file    Minutes per session: Not on file  . Stress: Not on file  Relationships  . Social connections:    Talks on phone: Not on file    Gets together: Not on file    Attends religious service: Not on file    Active member of club or organization: Not on file    Attends meetings of clubs or organizations: Not on file    Relationship status: Not on file  Other Topics Concern  . Not on file  Social History Narrative   Patient lives at home with husband [name].    Patient is retired.    Patient has a college education.    Patient has 2 children.    Patient drinks 2 cups of coffee daily.      Family History:  The patient's family history includes Alzheimer's disease in her father; Atrial  fibrillation in her brother; Dementia in her father, maternal grandmother, and paternal grandmother.   ROS:   Please see the history of present illness.    ROS All other systems reviewed and are negative.   PHYSICAL EXAM:   VS:  BP 122/64   Pulse 72   Ht 5\' 6"  (1.676 m)   Wt 173 lb (78.5 kg)   BMI 27.92 kg/m    GEN: Well nourished, well developed, in no acute distress  HEENT: normal  Neck: no JVD, carotid bruits, or masses  Cardiac: RRR; no murmurs, rubs, or gallops,no edema  Respiratory:  clear to auscultation bilaterally, normal work of breathing GI: soft, nontender, nondistended, + BS MS: no deformity or atrophy  Skin: warm and dry, no rash Neuro:  Alert and Oriented x 3, Strength and sensation are intact Psych: euthymic mood, full affect  Wt Readings from Last 3 Encounters:  12/14/17 173 lb (78.5 kg)  04/28/17 175 lb (79.4 kg)  04/06/17 171 lb (77.6 kg)      Studies/Labs Reviewed:   EKG:  EKG is ordered today.  It shows normal sinus rhythm, QTC 435 ms September 12 BUN 17, creatinine 0.7, glucose 111, potassium 4.6  Lipid Panel 06/27/2015 total cholesterol 172, triglycerides 137, HDL 46, LDL 99   ASSESSMENT:    1. Shortness of breath      PLAN:  In order of problems listed above:  1. Dyspnea: Symptom pattern is not consistent with cardiac etiology.  Most likely suspect is acid reflux.  At this point do not plan additional cardiac workup and she will follow-up with [place] on an as-needed basis.  The only additional cardiac test that might prove to be informative would be a right and left heart catheterization but as discussed at her previous appointment she does not appear to need invasive procedures at this time.  Asked her to call back if she develops signs of fluid overload such as lower extremity edema, orthopnea, PND or if her dyspnea has a clear-cut exertional pattern.     Medication Adjustments/Labs and Tests Ordered: Current medicines are reviewed at  length with the patient today.  Concerns regarding medicines are outlined above.  Medication changes, Labs and Tests ordered today are listed in the Patient Instructions below. There are no Patient Instructions on file for this visit.   Signed, Sanda Klein, MD  12/14/2017 9:27 AM    [facility] [address], [place], [place]  [address] Phone: [phone]; Fax: [phone]

## 2017-12-14 NOTE — Patient Instructions (Signed)
Dr Croitoru recommends that you follow-up with him as needed. 

## 2017-12-21 DIAGNOSIS — F329 Major depressive disorder, single episode, unspecified: Secondary | ICD-10-CM | POA: Diagnosis not present

## 2017-12-21 DIAGNOSIS — G4700 Insomnia, unspecified: Secondary | ICD-10-CM | POA: Diagnosis not present

## 2017-12-21 DIAGNOSIS — N390 Urinary tract infection, site not specified: Secondary | ICD-10-CM | POA: Diagnosis not present

## 2017-12-21 DIAGNOSIS — K219 Gastro-esophageal reflux disease without esophagitis: Secondary | ICD-10-CM | POA: Diagnosis not present

## 2018-01-03 DIAGNOSIS — K219 Gastro-esophageal reflux disease without esophagitis: Secondary | ICD-10-CM | POA: Diagnosis not present

## 2018-01-03 DIAGNOSIS — Z6829 Body mass index (BMI) 29.0-29.9, adult: Secondary | ICD-10-CM | POA: Diagnosis not present

## 2018-01-03 DIAGNOSIS — R05 Cough: Secondary | ICD-10-CM | POA: Diagnosis not present

## 2018-01-05 DIAGNOSIS — L718 Other rosacea: Secondary | ICD-10-CM | POA: Diagnosis not present

## 2018-01-05 DIAGNOSIS — H02883 Meibomian gland dysfunction of right eye, unspecified eyelid: Secondary | ICD-10-CM | POA: Diagnosis not present

## 2018-01-05 DIAGNOSIS — H02886 Meibomian gland dysfunction of left eye, unspecified eyelid: Secondary | ICD-10-CM | POA: Diagnosis not present

## 2018-01-05 DIAGNOSIS — H11823 Conjunctivochalasis, bilateral: Secondary | ICD-10-CM | POA: Diagnosis not present

## 2018-01-05 DIAGNOSIS — Z961 Presence of intraocular lens: Secondary | ICD-10-CM | POA: Diagnosis not present

## 2018-01-26 DIAGNOSIS — N3946 Mixed incontinence: Secondary | ICD-10-CM | POA: Diagnosis not present

## 2018-01-26 DIAGNOSIS — N3000 Acute cystitis without hematuria: Secondary | ICD-10-CM | POA: Diagnosis not present

## 2018-02-03 ENCOUNTER — Other Ambulatory Visit: Payer: Self-pay | Admitting: Gastroenterology

## 2018-02-03 DIAGNOSIS — K2100 Gastro-esophageal reflux disease with esophagitis, without bleeding: Secondary | ICD-10-CM

## 2018-02-03 DIAGNOSIS — R109 Unspecified abdominal pain: Secondary | ICD-10-CM | POA: Diagnosis not present

## 2018-02-04 ENCOUNTER — Ambulatory Visit
Admission: RE | Admit: 2018-02-04 | Discharge: 2018-02-04 | Disposition: A | Discharge status: Home / Self Care | Payer: Medicare Other | Source: Ambulatory Visit | Attending: Gastroenterology | Admitting: Gastroenterology

## 2018-02-04 DIAGNOSIS — K2100 Gastro-esophageal reflux disease with esophagitis, without bleeding: Secondary | ICD-10-CM

## 2018-03-11 DIAGNOSIS — H11823 Conjunctivochalasis, bilateral: Secondary | ICD-10-CM | POA: Diagnosis not present

## 2018-03-11 DIAGNOSIS — H02881 Meibomian gland dysfunction right upper eyelid: Secondary | ICD-10-CM | POA: Diagnosis not present

## 2018-03-11 DIAGNOSIS — Z961 Presence of intraocular lens: Secondary | ICD-10-CM | POA: Diagnosis not present

## 2018-03-11 DIAGNOSIS — H0288B Meibomian gland dysfunction left eye, upper and lower eyelids: Secondary | ICD-10-CM | POA: Diagnosis not present

## 2018-03-11 DIAGNOSIS — L718 Other rosacea: Secondary | ICD-10-CM | POA: Diagnosis not present

## 2018-03-21 DIAGNOSIS — K210 Gastro-esophageal reflux disease with esophagitis: Secondary | ICD-10-CM | POA: Diagnosis not present

## 2018-03-29 DIAGNOSIS — D045 Carcinoma in situ of skin of trunk: Secondary | ICD-10-CM | POA: Diagnosis not present

## 2018-03-29 DIAGNOSIS — L821 Other seborrheic keratosis: Secondary | ICD-10-CM | POA: Diagnosis not present

## 2018-03-29 DIAGNOSIS — D485 Neoplasm of uncertain behavior of skin: Secondary | ICD-10-CM | POA: Diagnosis not present

## 2018-03-29 DIAGNOSIS — Z85828 Personal history of other malignant neoplasm of skin: Secondary | ICD-10-CM | POA: Diagnosis not present

## 2018-03-29 DIAGNOSIS — D044 Carcinoma in situ of skin of scalp and neck: Secondary | ICD-10-CM | POA: Diagnosis not present

## 2018-04-06 DIAGNOSIS — M1612 Unilateral primary osteoarthritis, left hip: Secondary | ICD-10-CM | POA: Diagnosis not present

## 2018-04-13 DIAGNOSIS — K219 Gastro-esophageal reflux disease without esophagitis: Secondary | ICD-10-CM | POA: Diagnosis not present

## 2018-04-13 DIAGNOSIS — K449 Diaphragmatic hernia without obstruction or gangrene: Secondary | ICD-10-CM | POA: Diagnosis not present

## 2018-05-16 DIAGNOSIS — K210 Gastro-esophageal reflux disease with esophagitis: Secondary | ICD-10-CM | POA: Diagnosis not present

## 2018-05-16 DIAGNOSIS — R7301 Impaired fasting glucose: Secondary | ICD-10-CM | POA: Diagnosis not present

## 2018-05-16 DIAGNOSIS — N390 Urinary tract infection, site not specified: Secondary | ICD-10-CM | POA: Diagnosis not present

## 2018-05-16 DIAGNOSIS — F3289 Other specified depressive episodes: Secondary | ICD-10-CM | POA: Diagnosis not present

## 2018-05-16 DIAGNOSIS — R040 Epistaxis: Secondary | ICD-10-CM | POA: Diagnosis not present

## 2018-05-16 DIAGNOSIS — C9100 Acute lymphoblastic leukemia not having achieved remission: Secondary | ICD-10-CM | POA: Diagnosis not present

## 2018-05-16 DIAGNOSIS — R05 Cough: Secondary | ICD-10-CM | POA: Diagnosis not present

## 2018-05-16 DIAGNOSIS — R3129 Other microscopic hematuria: Secondary | ICD-10-CM | POA: Diagnosis not present

## 2018-05-16 DIAGNOSIS — Z6829 Body mass index (BMI) 29.0-29.9, adult: Secondary | ICD-10-CM | POA: Diagnosis not present

## 2018-05-28 DIAGNOSIS — Z23 Encounter for immunization: Secondary | ICD-10-CM | POA: Diagnosis not present

## 2018-06-15 DIAGNOSIS — M1712 Unilateral primary osteoarthritis, left knee: Secondary | ICD-10-CM | POA: Diagnosis not present

## 2018-07-18 DIAGNOSIS — N3020 Other chronic cystitis without hematuria: Secondary | ICD-10-CM | POA: Diagnosis not present

## 2018-08-03 DIAGNOSIS — N3020 Other chronic cystitis without hematuria: Secondary | ICD-10-CM | POA: Diagnosis not present

## 2018-08-03 DIAGNOSIS — N3942 Incontinence without sensory awareness: Secondary | ICD-10-CM | POA: Diagnosis not present

## 2018-08-03 DIAGNOSIS — R350 Frequency of micturition: Secondary | ICD-10-CM | POA: Diagnosis not present

## 2018-08-05 DIAGNOSIS — M1712 Unilateral primary osteoarthritis, left knee: Secondary | ICD-10-CM | POA: Diagnosis not present

## 2018-08-05 DIAGNOSIS — M25561 Pain in right knee: Secondary | ICD-10-CM | POA: Diagnosis not present

## 2018-08-05 DIAGNOSIS — M47896 Other spondylosis, lumbar region: Secondary | ICD-10-CM | POA: Diagnosis not present

## 2018-08-08 DIAGNOSIS — Z85828 Personal history of other malignant neoplasm of skin: Secondary | ICD-10-CM | POA: Diagnosis not present

## 2018-08-08 DIAGNOSIS — D485 Neoplasm of uncertain behavior of skin: Secondary | ICD-10-CM | POA: Diagnosis not present

## 2018-08-08 DIAGNOSIS — L821 Other seborrheic keratosis: Secondary | ICD-10-CM | POA: Diagnosis not present

## 2018-08-08 DIAGNOSIS — C44519 Basal cell carcinoma of skin of other part of trunk: Secondary | ICD-10-CM | POA: Diagnosis not present

## 2018-08-08 DIAGNOSIS — L570 Actinic keratosis: Secondary | ICD-10-CM | POA: Diagnosis not present

## 2018-08-08 DIAGNOSIS — D1801 Hemangioma of skin and subcutaneous tissue: Secondary | ICD-10-CM | POA: Diagnosis not present

## 2018-08-25 DIAGNOSIS — R300 Dysuria: Secondary | ICD-10-CM | POA: Diagnosis not present

## 2018-08-29 DIAGNOSIS — R300 Dysuria: Secondary | ICD-10-CM | POA: Diagnosis not present

## 2018-08-29 DIAGNOSIS — N3941 Urge incontinence: Secondary | ICD-10-CM | POA: Diagnosis not present

## 2018-08-29 DIAGNOSIS — N3020 Other chronic cystitis without hematuria: Secondary | ICD-10-CM | POA: Diagnosis not present

## 2018-08-29 DIAGNOSIS — N8111 Cystocele, midline: Secondary | ICD-10-CM | POA: Diagnosis not present

## 2018-09-19 DIAGNOSIS — N3941 Urge incontinence: Secondary | ICD-10-CM | POA: Diagnosis not present

## 2018-09-19 DIAGNOSIS — N952 Postmenopausal atrophic vaginitis: Secondary | ICD-10-CM | POA: Diagnosis not present

## 2018-09-19 DIAGNOSIS — N3020 Other chronic cystitis without hematuria: Secondary | ICD-10-CM | POA: Diagnosis not present

## 2018-10-04 ENCOUNTER — Other Ambulatory Visit: Payer: Self-pay | Admitting: Internal Medicine

## 2018-10-04 DIAGNOSIS — Z1231 Encounter for screening mammogram for malignant neoplasm of breast: Secondary | ICD-10-CM

## 2018-10-10 DIAGNOSIS — N952 Postmenopausal atrophic vaginitis: Secondary | ICD-10-CM | POA: Diagnosis not present

## 2018-10-10 DIAGNOSIS — R351 Nocturia: Secondary | ICD-10-CM | POA: Diagnosis not present

## 2018-10-10 DIAGNOSIS — N3020 Other chronic cystitis without hematuria: Secondary | ICD-10-CM | POA: Diagnosis not present

## 2018-10-10 DIAGNOSIS — N3941 Urge incontinence: Secondary | ICD-10-CM | POA: Diagnosis not present

## 2018-10-10 DIAGNOSIS — N8111 Cystocele, midline: Secondary | ICD-10-CM | POA: Diagnosis not present

## 2018-10-12 DIAGNOSIS — H02883 Meibomian gland dysfunction of right eye, unspecified eyelid: Secondary | ICD-10-CM | POA: Diagnosis not present

## 2018-10-12 DIAGNOSIS — Z961 Presence of intraocular lens: Secondary | ICD-10-CM | POA: Diagnosis not present

## 2018-10-12 DIAGNOSIS — H02886 Meibomian gland dysfunction of left eye, unspecified eyelid: Secondary | ICD-10-CM | POA: Diagnosis not present

## 2018-10-12 DIAGNOSIS — L718 Other rosacea: Secondary | ICD-10-CM | POA: Diagnosis not present

## 2018-10-12 DIAGNOSIS — H11823 Conjunctivochalasis, bilateral: Secondary | ICD-10-CM | POA: Diagnosis not present

## 2018-10-12 DIAGNOSIS — Z9889 Other specified postprocedural states: Secondary | ICD-10-CM | POA: Diagnosis not present

## 2018-10-25 DIAGNOSIS — H11823 Conjunctivochalasis, bilateral: Secondary | ICD-10-CM | POA: Diagnosis not present

## 2018-10-25 DIAGNOSIS — J449 Chronic obstructive pulmonary disease, unspecified: Secondary | ICD-10-CM | POA: Diagnosis not present

## 2018-10-26 DIAGNOSIS — Z9889 Other specified postprocedural states: Secondary | ICD-10-CM | POA: Diagnosis not present

## 2018-10-26 DIAGNOSIS — Z961 Presence of intraocular lens: Secondary | ICD-10-CM | POA: Diagnosis not present

## 2018-10-26 DIAGNOSIS — L718 Other rosacea: Secondary | ICD-10-CM | POA: Diagnosis not present

## 2018-10-26 DIAGNOSIS — H02889 Meibomian gland dysfunction of unspecified eye, unspecified eyelid: Secondary | ICD-10-CM | POA: Diagnosis not present

## 2018-10-26 DIAGNOSIS — Z48810 Encounter for surgical aftercare following surgery on the sense organs: Secondary | ICD-10-CM | POA: Diagnosis not present

## 2018-10-26 DIAGNOSIS — H11823 Conjunctivochalasis, bilateral: Secondary | ICD-10-CM | POA: Diagnosis not present

## 2018-10-31 DIAGNOSIS — N3020 Other chronic cystitis without hematuria: Secondary | ICD-10-CM | POA: Diagnosis not present

## 2018-10-31 DIAGNOSIS — M8589 Other specified disorders of bone density and structure, multiple sites: Secondary | ICD-10-CM | POA: Diagnosis not present

## 2018-10-31 DIAGNOSIS — M859 Disorder of bone density and structure, unspecified: Secondary | ICD-10-CM | POA: Diagnosis not present

## 2018-10-31 DIAGNOSIS — N952 Postmenopausal atrophic vaginitis: Secondary | ICD-10-CM | POA: Diagnosis not present

## 2018-10-31 DIAGNOSIS — N3941 Urge incontinence: Secondary | ICD-10-CM | POA: Diagnosis not present

## 2018-11-15 ENCOUNTER — Ambulatory Visit: Payer: Medicare Other

## 2018-11-16 DIAGNOSIS — M859 Disorder of bone density and structure, unspecified: Secondary | ICD-10-CM | POA: Diagnosis not present

## 2018-11-16 DIAGNOSIS — E7849 Other hyperlipidemia: Secondary | ICD-10-CM | POA: Diagnosis not present

## 2018-11-16 DIAGNOSIS — R7301 Impaired fasting glucose: Secondary | ICD-10-CM | POA: Diagnosis not present

## 2018-11-23 DIAGNOSIS — F3289 Other specified depressive episodes: Secondary | ICD-10-CM | POA: Diagnosis not present

## 2018-11-23 DIAGNOSIS — R600 Localized edema: Secondary | ICD-10-CM | POA: Diagnosis not present

## 2018-11-23 DIAGNOSIS — R413 Other amnesia: Secondary | ICD-10-CM | POA: Diagnosis not present

## 2018-11-23 DIAGNOSIS — C9100 Acute lymphoblastic leukemia not having achieved remission: Secondary | ICD-10-CM | POA: Diagnosis not present

## 2018-11-23 DIAGNOSIS — N390 Urinary tract infection, site not specified: Secondary | ICD-10-CM | POA: Diagnosis not present

## 2018-11-23 DIAGNOSIS — R7301 Impaired fasting glucose: Secondary | ICD-10-CM | POA: Diagnosis not present

## 2018-11-23 DIAGNOSIS — Z0000 Encounter for general adult medical examination without abnormal findings: Secondary | ICD-10-CM | POA: Diagnosis not present

## 2018-11-23 DIAGNOSIS — Z1331 Encounter for screening for depression: Secondary | ICD-10-CM | POA: Diagnosis not present

## 2018-11-23 DIAGNOSIS — M48061 Spinal stenosis, lumbar region without neurogenic claudication: Secondary | ICD-10-CM | POA: Diagnosis not present

## 2018-11-23 DIAGNOSIS — M25561 Pain in right knee: Secondary | ICD-10-CM | POA: Diagnosis not present

## 2018-11-23 DIAGNOSIS — J449 Chronic obstructive pulmonary disease, unspecified: Secondary | ICD-10-CM | POA: Diagnosis not present

## 2018-11-23 DIAGNOSIS — Z1339 Encounter for screening examination for other mental health and behavioral disorders: Secondary | ICD-10-CM | POA: Diagnosis not present

## 2018-11-29 DIAGNOSIS — N3020 Other chronic cystitis without hematuria: Secondary | ICD-10-CM | POA: Diagnosis not present

## 2018-11-29 DIAGNOSIS — N952 Postmenopausal atrophic vaginitis: Secondary | ICD-10-CM | POA: Diagnosis not present

## 2018-11-29 DIAGNOSIS — R300 Dysuria: Secondary | ICD-10-CM | POA: Diagnosis not present

## 2018-12-30 DIAGNOSIS — N3941 Urge incontinence: Secondary | ICD-10-CM | POA: Diagnosis not present

## 2018-12-30 DIAGNOSIS — N952 Postmenopausal atrophic vaginitis: Secondary | ICD-10-CM | POA: Diagnosis not present

## 2018-12-30 DIAGNOSIS — N3020 Other chronic cystitis without hematuria: Secondary | ICD-10-CM | POA: Diagnosis not present

## 2018-12-30 DIAGNOSIS — N3000 Acute cystitis without hematuria: Secondary | ICD-10-CM | POA: Diagnosis not present

## 2019-01-13 DIAGNOSIS — N952 Postmenopausal atrophic vaginitis: Secondary | ICD-10-CM | POA: Diagnosis not present

## 2019-01-13 DIAGNOSIS — N3020 Other chronic cystitis without hematuria: Secondary | ICD-10-CM | POA: Diagnosis not present

## 2019-01-13 DIAGNOSIS — N3941 Urge incontinence: Secondary | ICD-10-CM | POA: Diagnosis not present

## 2019-01-30 DIAGNOSIS — N3020 Other chronic cystitis without hematuria: Secondary | ICD-10-CM | POA: Diagnosis not present

## 2019-01-30 DIAGNOSIS — N952 Postmenopausal atrophic vaginitis: Secondary | ICD-10-CM | POA: Diagnosis not present

## 2019-01-30 DIAGNOSIS — N3941 Urge incontinence: Secondary | ICD-10-CM | POA: Diagnosis not present

## 2019-02-06 DIAGNOSIS — L718 Other rosacea: Secondary | ICD-10-CM | POA: Diagnosis not present

## 2019-02-06 DIAGNOSIS — L821 Other seborrheic keratosis: Secondary | ICD-10-CM | POA: Diagnosis not present

## 2019-02-06 DIAGNOSIS — Z85828 Personal history of other malignant neoplasm of skin: Secondary | ICD-10-CM | POA: Diagnosis not present

## 2019-02-06 DIAGNOSIS — D485 Neoplasm of uncertain behavior of skin: Secondary | ICD-10-CM | POA: Diagnosis not present

## 2019-02-06 DIAGNOSIS — L570 Actinic keratosis: Secondary | ICD-10-CM | POA: Diagnosis not present

## 2019-02-08 DIAGNOSIS — H11823 Conjunctivochalasis, bilateral: Secondary | ICD-10-CM | POA: Diagnosis not present

## 2019-02-08 DIAGNOSIS — Z961 Presence of intraocular lens: Secondary | ICD-10-CM | POA: Diagnosis not present

## 2019-02-08 DIAGNOSIS — H04123 Dry eye syndrome of bilateral lacrimal glands: Secondary | ICD-10-CM | POA: Diagnosis not present

## 2019-02-08 DIAGNOSIS — L718 Other rosacea: Secondary | ICD-10-CM | POA: Diagnosis not present

## 2019-02-08 DIAGNOSIS — H02889 Meibomian gland dysfunction of unspecified eye, unspecified eyelid: Secondary | ICD-10-CM | POA: Diagnosis not present

## 2019-02-20 DIAGNOSIS — K5900 Constipation, unspecified: Secondary | ICD-10-CM | POA: Diagnosis not present

## 2019-02-20 DIAGNOSIS — R05 Cough: Secondary | ICD-10-CM | POA: Diagnosis not present

## 2019-02-20 DIAGNOSIS — K921 Melena: Secondary | ICD-10-CM | POA: Diagnosis not present

## 2019-02-20 DIAGNOSIS — J069 Acute upper respiratory infection, unspecified: Secondary | ICD-10-CM | POA: Diagnosis not present

## 2019-02-20 DIAGNOSIS — K644 Residual hemorrhoidal skin tags: Secondary | ICD-10-CM | POA: Diagnosis not present

## 2019-03-20 DIAGNOSIS — N952 Postmenopausal atrophic vaginitis: Secondary | ICD-10-CM | POA: Diagnosis not present

## 2019-03-20 DIAGNOSIS — N3941 Urge incontinence: Secondary | ICD-10-CM | POA: Diagnosis not present

## 2019-03-20 DIAGNOSIS — R311 Benign essential microscopic hematuria: Secondary | ICD-10-CM | POA: Diagnosis not present

## 2019-03-20 DIAGNOSIS — N3020 Other chronic cystitis without hematuria: Secondary | ICD-10-CM | POA: Diagnosis not present

## 2019-03-20 DIAGNOSIS — R3121 Asymptomatic microscopic hematuria: Secondary | ICD-10-CM | POA: Diagnosis not present

## 2019-03-31 DIAGNOSIS — J449 Chronic obstructive pulmonary disease, unspecified: Secondary | ICD-10-CM | POA: Diagnosis not present

## 2019-03-31 DIAGNOSIS — R0600 Dyspnea, unspecified: Secondary | ICD-10-CM | POA: Diagnosis not present

## 2019-03-31 DIAGNOSIS — K219 Gastro-esophageal reflux disease without esophagitis: Secondary | ICD-10-CM | POA: Diagnosis not present

## 2019-03-31 DIAGNOSIS — E785 Hyperlipidemia, unspecified: Secondary | ICD-10-CM | POA: Diagnosis not present

## 2019-04-05 DIAGNOSIS — N952 Postmenopausal atrophic vaginitis: Secondary | ICD-10-CM | POA: Diagnosis not present

## 2019-04-05 DIAGNOSIS — N3941 Urge incontinence: Secondary | ICD-10-CM | POA: Diagnosis not present

## 2019-04-05 DIAGNOSIS — R3121 Asymptomatic microscopic hematuria: Secondary | ICD-10-CM | POA: Diagnosis not present

## 2019-04-17 ENCOUNTER — Ambulatory Visit (INDEPENDENT_AMBULATORY_CARE_PROVIDER_SITE_OTHER): Payer: Medicare Other | Admitting: Emergency Medicine

## 2019-04-17 ENCOUNTER — Encounter: Payer: Self-pay | Admitting: Emergency Medicine

## 2019-04-17 ENCOUNTER — Ambulatory Visit (INDEPENDENT_AMBULATORY_CARE_PROVIDER_SITE_OTHER): Payer: Medicare Other

## 2019-04-17 ENCOUNTER — Other Ambulatory Visit: Payer: Self-pay

## 2019-04-17 DIAGNOSIS — R0602 Shortness of breath: Secondary | ICD-10-CM | POA: Diagnosis not present

## 2019-04-17 NOTE — Progress Notes (Signed)
Subjective:    Patient ID: Brenda [name], female    DOB: August 05, 1932, 85 y.o.   MRN: [id number]   Brenda [name] is a 85 year old female who presents today for evaluation of dyspnea that she first noticed over the past 2-3 weeks. She has a PMH notable for CLL, GAD, GERD, frequent UTI's but denied tobacco use. The dyspnea is exertional in nature and although she first noted in this past few weeks it was likely present for months and she simply chose to ignore it. She does not recall a sudden onset of symptoms. When reminded of her prior visit in 2018 and her description of symptoms on that visit she stated she felt that this was similar in nature to her presentation today. She did feel that the Anora inhaler was beneficial when last used. She continues to wear her back brace but did not feel that removal of such improved her symptoms.   ROV 04/28/17 -- This follow-up visit for patient with a history of multifactorial dyspnea.  Likely components of restriction, possibly obstruction based on hyperinflation of her chest x-ray. She believes that her exertional tolerance is a bit better, able to walk on flat ground. She has not tried household chores, etc. She has been on Anoro for 3+ weeks.   Review of Systems  Constitutional: Negative for fever and unexpected weight change.  HENT: Negative for congestion, dental problem, ear pain, nosebleeds, postnasal drip, rhinorrhea, sinus pressure, sneezing, sore throat and trouble swallowing.   Eyes: Negative for redness and itching.  Respiratory: Positive for cough, shortness of breath and wheezing. Negative for chest tightness.   Cardiovascular: Negative for palpitations and leg swelling.  Gastrointestinal: Negative for nausea and vomiting.  Genitourinary: Negative for dysuria.  Musculoskeletal: Negative for joint swelling.  Skin: Negative for rash.  Neurological: Negative for headaches.  Hematological: Does not bruise/bleed easily.  Psychiatric/Behavioral:  Negative for dysphoric mood. The patient is not nervous/anxious.               Past Medical History:  Diagnosis Date  . Anxiety   . Arthritis   . CLL (chronic lymphocytic leukemia) ([place])   . Complication of anesthesia   . Depression   . Depression with anxiety   . Diverticulosis   . Dyspnea   . GERD (gastroesophageal reflux disease)   . Hearing loss   . Memory loss   . Mild cognitive impairment   . Osteopenia   . PONV (postoperative nausea and vomiting)   . Reflux   . SOB (shortness of breath)      Family History  Problem Relation Age of Onset  . Alzheimer's disease Father   . Dementia Father   . Atrial fibrillation Brother   . Dementia Maternal Grandmother   . Dementia Paternal Grandmother      Social History   Socioeconomic History  . Marital status: Married    Spouse name: Brenda [name]  . Number of children: 2  . Years of education: college  . Highest education level: Not on file  Occupational History  . Not on file  Social Needs  . Financial resource strain: Not on file  . Food insecurity    Worry: Not on file    Inability: Not on file  . Transportation needs    Medical: Not on file    Non-medical: Not on file  Tobacco Use  . Smoking status: Never Smoker  . Smokeless tobacco: Never Used  Substance and Sexual Activity  . Alcohol use: No  .  Drug use: No  . Sexual activity: Not on file  Lifestyle  . Physical activity    Days per week: Not on file    Minutes per session: Not on file  . Stress: Not on file  Relationships  . Social Herbalist on phone: Not on file    Gets together: Not on file    Attends religious service: Not on file    Active member of club or organization: Not on file    Attends meetings of clubs or organizations: Not on file    Relationship status: Not on file  . Intimate partner violence    Fear of current or ex partner: Not on file    Emotionally abused: Not on file    Physically abused: Not on file    Forced sexual  activity: Not on file  Other Topics Concern  . Not on file  Social History Narrative   Patient lives at home with husband Brenda [name].    Patient is retired.    Patient has a college education.    Patient has 2 children.    Patient drinks 2 cups of coffee daily.     Allergies  Allergen Reactions  . Adhesive [Tape] Rash  . Codeine Nausea And Vomiting  . Doxycycline Rash    Severe Reflux , cheek "flushing"  . Sulfa Antibiotics Rash     Outpatient Medications Prior to Visit  Medication Sig Dispense Refill  . atorvastatin (LIPITOR) 20 MG tablet Take 20 mg by mouth daily at 6 PM.    . citalopram (CELEXA) 10 MG tablet Take 1 tablet (10 mg total) by mouth daily. 30 tablet 11  . desvenlafaxine (PRISTIQ) 50 MG 24 hr tablet desvenlafaxine succinate ER 50 mg tablet,extended release 24 hr    . estradiol (ESTRING) 2 MG vaginal ring Place 2 mg vaginally every 3 (three) months. follow package directions    . LORazepam (ATIVAN) 0.5 MG tablet Take 0.5 mg by mouth at bedtime.     . Multiple Vitamin (MULTIVITAMIN) capsule Take 1 capsule by mouth daily.    . Multiple Vitamins-Minerals (PRESERVISION AREDS 2+MULTI VIT PO) PreserVision AREDS    . OVER THE COUNTER MEDICATION thera tears prn dry ears    . pantoprazole (PROTONIX) 40 MG tablet Take 40 mg by mouth daily.     . ranitidine (ZANTAC) 150 MG tablet ranitidine 150 mg tablet    . White Petrolatum-Mineral Oil (SYSTANE NIGHTTIME) OINT Place into both eyes nightly.    Marland Kitchen ZETIA 10 MG tablet Take 10 mg by mouth at bedtime.     Marland Kitchen zolpidem (AMBIEN) 5 MG tablet Take 5 mg by mouth at bedtime.     Marland Kitchen azithromycin (ZITHROMAX) 250 MG tablet Take one pill by mouth Monday, Wednesday, and Friday    . cephALEXin (KEFLEX) 500 MG capsule Take 1 capsule by mouth 2 (two) times daily.    Marland Kitchen umeclidinium-vilanterol (ANORO ELLIPTA) 62.5-25 MCG/INH AEPB Inhale 1 puff into the lungs daily.     No facility-administered medications prior to visit.       Objective:   Physical  Exam Vitals:   04/17/19 1445  BP: 110/78  Pulse: 96  Temp: 98.2 F (36.8 C)  TempSrc: Oral  SpO2: 96%  Weight: 178 lb 6.4 oz (80.9 kg)  Height: 5\' 6"  (1.676 m)   General: A/O x4, in no acute distress, afebrile, nondiaphoretic HEENT: PEERL, EMO intact Cardio: RRR, no mrg's  Pulmonary: CTA bilaterally, no wheezing or crackles  Abdomen: Bowel sounds normal, soft, nontender  MSK: BLE nontender, nonedematous Neuro: Alert, normal gait Psych: Appropriate affect, not depressed in appearance, engages well     Assessment & Plan:  Dyspnea Dyspnea:  Felt to be more restrictive in nature due to her back brace but this is uncertain given her persistent symptoms absent the brace. She does not have a history of tobacco use, nor has she had known exposures to biomass fuel use indoors to suspect COPD as the most likely etiology.  She noted a recent history of hematuria but I do not feel this is related currently.  Will evaluate for anemia with CBC as most recent CBC on file is from 2017.  CMP for LFT's and renal function ordered. I am less concerned for a cardiac abnormality given her relatively normal Echo in 2017 and symptomatology today.  Plan: Follow-up PFT's Follow-up CXR Follow-up CBC/CMP   Kathi Ludwig, MD, PGY-3 04/17/2019, 3:40 PM    Attending Note:  I have examined patient, reviewed labs, studies and notes.   85 year old woman with history of CLL, GERD, frequent UTIs.  Last seen here about 2 years ago for shortness of breath, chronic cough.  She returns today for further evaluation of shortness of breath.  Most notable over 2 to 3 weeks but with further conversation dates back for several years as above.  At her last visit she was tried on Anoro, is unsure whether it was beneficial but it was not continued.  She gets short of breath with ADLs, denies any wheezing, pain she does have some GERD-associated cough that is improved when she takes a PPI.  She questions whether there  may be an anxiety component.  I suspect that a large contributor to her dyspnea is restrictive disease due to obesity, also her need to wear a back brace which is tight and impairs inspiration.  We will plan to perform a chest x-ray today, arrange for full pulmonary function testing to assess for degree of restriction or obstruction.  Check a CBC to rule out any contribution of anemia.  Follow-up after to review her studies.   Baltazar Apo, MD, PhD 25-Apr-2019, 5:05 PM [facility] [phone] or if no answer [phone]

## 2019-04-17 NOTE — Patient Instructions (Signed)
Please complete a chest xray as well as a CBC with differential and a CMP today for evaluation of you shortness of breath. This X-ray and the lab work will help with determining the cause of your symptoms as well. In addition, we will order pulmonary function testing (PFT's). Please have this completed to better allow [place] to understand the cause of your shortness of breath. You will need to meet with Dr. Lamonte Sakai the day of your PFT's to save a trip. Please have this completed at my next available appointment.

## 2019-04-17 NOTE — Assessment & Plan Note (Addendum)
Dyspnea:  Felt to be more restrictive in nature due to her back brace but this is uncertain given her persistent symptoms absent the brace. She does not have a history of tobacco use, nor has she had known exposures to biomass fuel use indoors to suspect COPD as the most likely etiology.  She noted a recent history of hematuria but I do not feel this is related currently.  Will evaluate for anemia with CBC as most recent CBC on file is from 2017.  CMP for LFT's and renal function ordered. I am less concerned for a cardiac abnormality given her relatively normal Echo in 2017 and symptomatology today.  Plan: Follow-up PFT's Follow-up CXR Follow-up CBC/CMP

## 2019-04-18 DIAGNOSIS — R3129 Other microscopic hematuria: Secondary | ICD-10-CM | POA: Diagnosis not present

## 2019-04-18 DIAGNOSIS — R3121 Asymptomatic microscopic hematuria: Secondary | ICD-10-CM | POA: Diagnosis not present

## 2019-04-18 LAB — COMPREHENSIVE METABOLIC PANEL
ALT: 23 U/L (ref 0–35)
AST: 26 U/L (ref 0–37)
Albumin: 4.5 g/dL (ref 3.5–5.2)
Alkaline Phosphatase: 54 U/L (ref 39–117)
BUN: 16 mg/dL (ref 6–23)
CO2: 29 mEq/L (ref 19–32)
Calcium: 9.4 mg/dL (ref 8.4–10.5)
Chloride: 99 mEq/L (ref 96–112)
Creatinine, Ser: 0.72 mg/dL (ref 0.40–1.20)
GFR: 76.99 mL/min (ref >60.00)
Glucose, Bld: 99 mg/dL (ref 70–99)
Potassium: 4.3 mEq/L (ref 3.5–5.1)
Sodium: 135 mEq/L (ref 135–145)
Total Bilirubin: 0.4 mg/dL (ref 0.2–1.2)
Total Protein: 6.6 g/dL (ref 6.0–8.3)

## 2019-04-18 LAB — CBC WITH DIFFERENTIAL/PLATELET
Basophils Absolute: 0.1 10*3/uL (ref 0.0–0.1)
Basophils Relative: 1 % (ref 0.0–3.0)
Eosinophils Absolute: 0.1 10*3/uL (ref 0.0–0.7)
Eosinophils Relative: 1.2 % (ref 0.0–5.0)
HCT: 45.4 % (ref 36.0–46.0)
Hemoglobin: 14.7 g/dL (ref 12.0–15.0)
Lymphocytes Relative: 55.9 % — ABNORMAL HIGH (ref 12.0–46.0)
Lymphs Abs: 6.6 10*3/uL — ABNORMAL HIGH (ref 0.7–4.0)
MCHC: 32.4 g/dL (ref 30.0–36.0)
MCV: 92 fl (ref 78.0–100.0)
Monocytes Absolute: 0.7 10*3/uL (ref 0.1–1.0)
Monocytes Relative: 6.3 % (ref 3.0–12.0)
Neutro Abs: 4.2 10*3/uL (ref 1.4–7.7)
Neutrophils Relative %: 35.6 % — ABNORMAL LOW (ref 43.0–77.0)
Platelets: 303 10*3/uL (ref 150.0–400.0)
RBC: 4.93 Mil/uL (ref 3.87–5.11)
RDW: 14.2 % (ref 11.5–15.5)
WBC: 11.7 10*3/uL — ABNORMAL HIGH (ref 4.0–10.5)

## 2019-04-21 DIAGNOSIS — Z23 Encounter for immunization: Secondary | ICD-10-CM | POA: Diagnosis not present

## 2019-04-27 DIAGNOSIS — R300 Dysuria: Secondary | ICD-10-CM | POA: Diagnosis not present

## 2019-04-27 DIAGNOSIS — N3000 Acute cystitis without hematuria: Secondary | ICD-10-CM | POA: Diagnosis not present

## 2019-04-27 DIAGNOSIS — R3121 Asymptomatic microscopic hematuria: Secondary | ICD-10-CM | POA: Diagnosis not present

## 2019-04-28 DIAGNOSIS — H02886 Meibomian gland dysfunction of left eye, unspecified eyelid: Secondary | ICD-10-CM | POA: Diagnosis not present

## 2019-04-28 DIAGNOSIS — H02883 Meibomian gland dysfunction of right eye, unspecified eyelid: Secondary | ICD-10-CM | POA: Diagnosis not present

## 2019-04-28 DIAGNOSIS — L718 Other rosacea: Secondary | ICD-10-CM | POA: Diagnosis not present

## 2019-05-03 DIAGNOSIS — Z85828 Personal history of other malignant neoplasm of skin: Secondary | ICD-10-CM | POA: Diagnosis not present

## 2019-05-03 DIAGNOSIS — D0462 Carcinoma in situ of skin of left upper limb, including shoulder: Secondary | ICD-10-CM | POA: Diagnosis not present

## 2019-05-03 DIAGNOSIS — D485 Neoplasm of uncertain behavior of skin: Secondary | ICD-10-CM | POA: Diagnosis not present

## 2019-05-03 DIAGNOSIS — L821 Other seborrheic keratosis: Secondary | ICD-10-CM | POA: Diagnosis not present

## 2019-05-03 DIAGNOSIS — L570 Actinic keratosis: Secondary | ICD-10-CM | POA: Diagnosis not present

## 2019-05-12 DIAGNOSIS — H02889 Meibomian gland dysfunction of unspecified eye, unspecified eyelid: Secondary | ICD-10-CM | POA: Diagnosis not present

## 2019-05-12 DIAGNOSIS — H02886 Meibomian gland dysfunction of left eye, unspecified eyelid: Secondary | ICD-10-CM | POA: Diagnosis not present

## 2019-05-12 DIAGNOSIS — Z961 Presence of intraocular lens: Secondary | ICD-10-CM | POA: Diagnosis not present

## 2019-05-12 DIAGNOSIS — L718 Other rosacea: Secondary | ICD-10-CM | POA: Diagnosis not present

## 2019-05-12 DIAGNOSIS — H02883 Meibomian gland dysfunction of right eye, unspecified eyelid: Secondary | ICD-10-CM | POA: Diagnosis not present

## 2019-05-12 DIAGNOSIS — H04123 Dry eye syndrome of bilateral lacrimal glands: Secondary | ICD-10-CM | POA: Diagnosis not present

## 2019-06-01 ENCOUNTER — Other Ambulatory Visit: Payer: Self-pay | Admitting: Emergency Medicine

## 2019-06-12 ENCOUNTER — Other Ambulatory Visit (HOSPITAL_COMMUNITY)
Admission: RE | Admit: 2019-06-12 | Discharge: 2019-06-12 | Disposition: A | Discharge status: Home / Self Care | Payer: Medicare Other | Source: Ambulatory Visit | Attending: Emergency Medicine | Admitting: Emergency Medicine

## 2019-06-12 DIAGNOSIS — Z20828 Contact with and (suspected) exposure to other viral communicable diseases: Secondary | ICD-10-CM | POA: Diagnosis not present

## 2019-06-12 DIAGNOSIS — Z01812 Encounter for preprocedural laboratory examination: Secondary | ICD-10-CM | POA: Diagnosis not present

## 2019-06-12 LAB — SARS CORONAVIRUS 2 (TAT 6-24 HRS): SARS Coronavirus 2: NEGATIVE

## 2019-06-14 DIAGNOSIS — K449 Diaphragmatic hernia without obstruction or gangrene: Secondary | ICD-10-CM | POA: Diagnosis not present

## 2019-06-14 DIAGNOSIS — K2100 Gastro-esophageal reflux disease with esophagitis, without bleeding: Secondary | ICD-10-CM | POA: Diagnosis not present

## 2019-06-15 ENCOUNTER — Ambulatory Visit (INDEPENDENT_AMBULATORY_CARE_PROVIDER_SITE_OTHER): Payer: Medicare Other | Admitting: Pulmonary Disease

## 2019-06-15 ENCOUNTER — Ambulatory Visit (INDEPENDENT_AMBULATORY_CARE_PROVIDER_SITE_OTHER): Payer: Medicare Other | Admitting: Emergency Medicine

## 2019-06-15 ENCOUNTER — Encounter: Payer: Self-pay | Admitting: Pulmonary Disease

## 2019-06-15 ENCOUNTER — Other Ambulatory Visit: Payer: Self-pay

## 2019-06-15 VITALS — BP 118/62 | HR 78 | Temp 98.0°F | Ht 65.0 in | Wt 180.0 lb

## 2019-06-15 DIAGNOSIS — R0602 Shortness of breath: Secondary | ICD-10-CM | POA: Diagnosis not present

## 2019-06-15 DIAGNOSIS — R931 Abnormal findings on diagnostic imaging of heart and coronary circulation: Secondary | ICD-10-CM

## 2019-06-15 LAB — PULMONARY FUNCTION TEST
DL/VA % pred: 99 %
DL/VA: 3.97 ml/min/mmHg/L
DLCO unc % pred: 84 %
DLCO unc: 16.16 ml/min/mmHg
FEF 25-75 Post: 1.15 L/sec
FEF 25-75 Pre: 1.32 L/sec
FEF2575-%Change-Post: -13 %
FEF2575-%Pred-Post: 94 %
FEF2575-%Pred-Pre: 109 %
FEV1-%Change-Post: -1 %
FEV1-%Pred-Post: 89 %
FEV1-%Pred-Pre: 91 %
FEV1-Post: 1.66 L
FEV1-Pre: 1.69 L
FEV1FVC-%Change-Post: 3 %
FEV1FVC-%Pred-Pre: 101 %
FEV6-%Change-Post: -3 %
FEV6-%Pred-Post: 91 %
FEV6-%Pred-Pre: 95 %
FEV6-Post: 2.16 L
FEV6-Pre: 2.25 L
FEV6FVC-%Change-Post: 0 %
FEV6FVC-%Pred-Post: 106 %
FEV6FVC-%Pred-Pre: 105 %
FVC-%Change-Post: -4 %
FVC-%Pred-Post: 86 %
FVC-%Pred-Pre: 90 %
FVC-Post: 2.18 L
FVC-Pre: 2.29 L
Post FEV1/FVC ratio: 76 %
Post FEV6/FVC ratio: 100 %
Pre FEV1/FVC ratio: 74 %
Pre FEV6/FVC Ratio: 100 %
RV % pred: 94 %
RV: 2.4 L
TLC % pred: 99 %
TLC: 5.17 L

## 2019-06-15 NOTE — Progress Notes (Signed)
@Patient  ID: Brenda [name], female    DOB: 19-Jun-1932, 85 y.o.   MRN: [id number]  Chief Complaint  Patient presents with  . Follow-up    Patient reports that she has sob with exertion. She reports that she hasnt been walking as much as she should.     Referring provider: Crist Infante, MD  HPI:  85 year old female initially referred to our office in 2018 for chronic cough as well as dyspnea on exertion.  Patient presented back to our office earlier in 2020 for further evaluation of continued dyspnea on exertion.  PMH: CLL, GAD, GERD, frequent UTIs Smoker/ Smoking History: Never smoker Maintenance:  None, tried anoro in the past with no help  Pt of: Dr. Lamonte Sakai   06/15/2019  - Visit   85 year old female never smoker followed in our office for dyspnea on exertion and chronic cough.  Patient presenting today after completing pulmonary function testing. See PFT results below:   06/15/2019-pulmonary function testing-FVC 2.29 (90% predicted), postbronchodilator ratio 76, postbronchodilator FEV1 1.66 (89% predicted), no bronchodilator response, DLCO 16.16 (84% predicted)  Patient reports a pulmonary function test was performed without back brace on.  Patient reports that she applies the back brace to see if she gets up in the mornings the last day she takes off before she goes to bed.  She has to wear the back brace due to a degenerative sacral joint.  She does admit that she has not been as active lately.  She reports she typically was active in the past. Patient has some acute concerns that this may be a genetic component of COPD as primary care is labeled her as COPD.  Patient has no other family members with alpha-1.  She has a normal chest x-ray as well as normal pulmonary function testing today.  Patient does have an abnormal echocardiogram in 2017 that was read by cardiology to potentially have diastolic dysfunction.  She was encouraged at that point in time to start Lasix 20 mg daily to  help with fluid retention.  Weights are up today and have trended up 8 pounds over the last 2 years.  Lasix was not started because the patient had some concerns regarding her lower blood pressure as well as primary care.  Tests:   04/17/2019-CBC with differential-eosinophils relative 1.2, eosinophils absolute 0.1, hemoglobin 14.7  06/12/2019-SARS-CoV-2-negative  04/17/2019-chest x-ray-small hiatal hernia, no acute abnormality noted  06/18/2016-echocardiogram-LV ejection fraction 60 to 123456, grade 1 diastolic dysfunction, PAP pressure 39  06/15/2019-pulmonary function testing-FVC 2.29 (90% predicted), postbronchodilator ratio 76, postbronchodilator FEV1 1.66 (89% predicted), no bronchodilator response, DLCO 16.16 (84% predicted)   FENO:  No results found for: NITRICOXIDE  PFT: PFT Results Latest Ref Rng & Units 06/15/2019  FVC-Pre L 2.29  FVC-Predicted Pre % 90  FVC-Post L 2.18  FVC-Predicted Post % 86  Pre FEV1/FVC % % 74  Post FEV1/FCV % % 76  FEV1-Pre L 1.69  FEV1-Predicted Pre % 91  FEV1-Post L 1.66  DLCO UNC% % 84  DLCO COR %Predicted % 99  TLC L 5.17  TLC % Predicted % 99  RV % Predicted % 94    WALK:  SIX MIN WALK 06/15/2019  Supplimental Oxygen during Test? (L/min) No  Tech Comments: Pt. walked at a steady pace with mild sob toward the end. ER    Imaging: No results found.  Lab Results:  CBC    Component Value Date/Time   WBC 11.7 (H) 04/17/2019 1603   RBC 4.93 04/17/2019 1603  HGB 14.7 04/17/2019 1603   HGB 14.8 02/04/2010 1423   HCT 45.4 04/17/2019 1603   HCT 42.8 02/04/2010 1423   PLT 303.0 04/17/2019 1603   PLT 272 02/04/2010 1423   MCV 92.0 04/17/2019 1603   MCV 99.3 (A) 08/19/2013 1223   MCV 93.2 02/04/2010 1423   MCH 30.4 06/26/2016 1045   MCHC 32.4 04/17/2019 1603   RDW 14.2 04/17/2019 1603   RDW 13.4 02/04/2010 1423   LYMPHSABS 6.6 (H) 04/17/2019 1603   LYMPHSABS 7.6 (H) 02/04/2010 1423   MONOABS 0.7 04/17/2019 1603   MONOABS 0.5  02/04/2010 1423   EOSABS 0.1 04/17/2019 1603   EOSABS 0.1 02/04/2010 1423   BASOSABS 0.1 04/17/2019 1603   BASOSABS 0.0 02/04/2010 1423    BMET    Component Value Date/Time   NA 135 04/17/2019 1603   K 4.3 04/17/2019 1603   CL 99 04/17/2019 1603   CO2 29 04/17/2019 1603   GLUCOSE 99 04/17/2019 1603   BUN 16 04/17/2019 1603   CREATININE 0.72 04/17/2019 1603   CALCIUM 9.4 04/17/2019 1603   GFRNONAA >60 06/26/2016 1045   GFRAA >60 06/26/2016 1045    BNP No results found for: BNP  ProBNP    Component Value Date/Time   PROBNP 79.8 04/23/2014 1603    Specialty Problems      Pulmonary Problems   Dyspnea    06/15/2019-pulmonary function testing-FVC 2.29 (90% predicted), postbronchodilator ratio 76, postbronchodilator FEV1 1.66 (89% predicted), no bronchodilator response, DLCO 16.16 (84% predicted)  04/17/2019-chest x-ray-small hiatal hernia, no acute abnormality noted  06/18/2016-echocardiogram-LV ejection fraction 60 to 123456, grade 1 diastolic dysfunction, PAP pressure 39         Allergies  Allergen Reactions  . Adhesive [Tape] Rash  . Codeine Nausea And Vomiting  . Doxycycline Rash    Severe Reflux , cheek "flushing"  . Sulfa Antibiotics Rash    Immunization History  Administered Date(s) Administered  . Influenza,inj,Quad PF,6+ Mos 05/29/2016, 05/16/2019  . Influenza,inj,quad, With Preservative 05/01/2017  . Pneumococcal-Unspecified 08/31/2016    Past Medical History:  Diagnosis Date  . Anxiety   . Arthritis   . CLL (chronic lymphocytic leukemia) ([place])   . Complication of anesthesia   . Depression   . Depression with anxiety   . Diverticulosis   . Dyspnea   . GERD (gastroesophageal reflux disease)   . Hearing loss   . Memory loss   . Mild cognitive impairment   . Osteopenia   . PONV (postoperative nausea and vomiting)   . Reflux   . SOB (shortness of breath)     Tobacco History: Social History   Tobacco Use  Smoking Status Never Smoker   Smokeless Tobacco Never Used   Counseling given: Yes  Continue to not smoke  Outpatient Encounter Medications as of 06/15/2019  Medication Sig  . atorvastatin (LIPITOR) 20 MG tablet Take 20 mg by mouth daily at 6 PM.  . desvenlafaxine (PRISTIQ) 50 MG 24 hr tablet desvenlafaxine succinate ER 50 mg tablet,extended release 24 hr  . estradiol (ESTRING) 2 MG vaginal ring Place 2 mg vaginally every 3 (three) months. follow package directions  . famotidine (PEPCID) 40 MG tablet Take 40 mg by mouth at bedtime.  Marland Kitchen LORazepam (ATIVAN) 0.5 MG tablet Take 0.5 mg by mouth at bedtime.   . Multiple Vitamin (MULTIVITAMIN) capsule Take 1 capsule by mouth daily.  . Multiple Vitamins-Minerals (PRESERVISION AREDS 2+MULTI VIT PO) PreserVision AREDS  . MYRBETRIQ 50 MG TB24 tablet   .  OVER THE COUNTER MEDICATION thera tears prn dry ears  . solifenacin (VESICARE) 5 MG tablet   . White Petrolatum-Mineral Oil (SYSTANE NIGHTTIME) OINT Place into both eyes nightly.  Marland Kitchen ZETIA 10 MG tablet Take 10 mg by mouth at bedtime.   Marland Kitchen zolpidem (AMBIEN) 5 MG tablet Take 5 mg by mouth at bedtime.   . [DISCONTINUED] umeclidinium-vilanterol (ANORO ELLIPTA) 62.5-25 MCG/INH AEPB Inhale 1 puff into the lungs daily.  . [DISCONTINUED] citalopram (CELEXA) 10 MG tablet Take 1 tablet (10 mg total) by mouth daily.  . [DISCONTINUED] pantoprazole (PROTONIX) 40 MG tablet Take 40 mg by mouth daily.   . [DISCONTINUED] ranitidine (ZANTAC) 150 MG tablet ranitidine 150 mg tablet   No facility-administered encounter medications on file as of 06/15/2019.      Review of Systems  Review of Systems  Constitutional: Positive for fatigue. Negative for activity change and fever.  HENT: Negative for congestion, sinus pressure, sinus pain and sore throat.   Respiratory: Positive for shortness of breath. Negative for cough and wheezing.   Cardiovascular: Negative for chest pain and palpitations.  Gastrointestinal: Negative for diarrhea, nausea and  vomiting.  Musculoskeletal: Negative for arthralgias.  Neurological: Negative for dizziness.  Psychiatric/Behavioral: Negative for sleep disturbance. The patient is not nervous/anxious.      Physical Exam  BP 118/62 (BP Location: Left Arm, Cuff Size: Normal)   Pulse 78   Temp 98 F (36.7 C) (Temporal)   Ht 5\' 5"  (1.651 m)   Wt 180 lb (81.6 kg)   SpO2 95%   BMI 29.95 kg/m   Wt Readings from Last 5 Encounters:  06/15/19 180 lb (81.6 kg)  04/17/19 178 lb 6.4 oz (80.9 kg)  12/14/17 173 lb (78.5 kg)  04/28/17 175 lb (79.4 kg)  04/06/17 171 lb (77.6 kg)    BMI Readings from Last 5 Encounters:  06/15/19 29.95 kg/m  04/17/19 28.79 kg/m  12/14/17 27.92 kg/m  04/28/17 28.25 kg/m  04/06/17 27.60 kg/m     Physical Exam Vitals signs and nursing note reviewed.  Constitutional:      General: She is not in acute distress.    Appearance: Normal appearance.  HENT:     Head: Normocephalic and atraumatic.     Right Ear: Tympanic membrane, ear canal and external ear normal. There is no impacted cerumen.     Left Ear: Tympanic membrane, ear canal and external ear normal. There is no impacted cerumen.     Nose: Nose normal. No congestion.     Mouth/Throat:     Mouth: Mucous membranes are moist.     Pharynx: Oropharynx is clear.  Eyes:     Pupils: Pupils are equal, round, and reactive to light.  Neck:     Musculoskeletal: Normal range of motion.  Cardiovascular:     Rate and Rhythm: Normal rate and regular rhythm.     Pulses: Normal pulses.     Heart sounds: Normal heart sounds. No murmur.  Pulmonary:     Effort: Pulmonary effort is normal. No respiratory distress.     Breath sounds: Normal breath sounds. No decreased air movement. No decreased breath sounds, wheezing or rales.     Comments: Back brace applied Musculoskeletal:     Right lower leg: No edema.     Left lower leg: No edema.  Skin:    General: Skin is warm and dry.     Capillary Refill: Capillary refill takes  less than 2 seconds.  Neurological:     General:  No focal deficit present.     Mental Status: She is alert and oriented to person, place, and time. Mental status is at baseline.     Gait: Gait (Tolerated walk in office) normal.  Psychiatric:        Mood and Affect: Mood normal.        Behavior: Behavior normal.        Thought Content: Thought content normal.        Judgment: Judgment normal.       Assessment & Plan:   Dyspnea Discussion: Pulmonary function today is normal.  Not showing any aspects of COPD.  Not showing any diffusion defects.  Not showing restriction.  Pulmonary function testing today was performed without patient's back brace.  Likely believe that dyspnea is directly related to physical deconditioning as well as her back brace which she has to wear for her degenerative sacral joint.  Patient also thought that she may have COPD PD that is more of a genetic component such as alpha 1.  We discussed testing for alpha 1.  I highly doubt that she has alpha-1 antitrypsin deficiency as she is 85 years old with a normal chest x-ray as well as normal pulmonary function testing.  I offered to perform alpha-1 testing to see if she is potentially a carrier.  She declined that today.  Plan: Walk today in office Work on increasing daily physical activity Follow-up with primary care Echocardiogram from 2017 by cardiology did have patient start taking Lasix. Weights are increasing.  I would recommend the patient follow-up with primary care or cardiology as pulmonary function testing has been normal.  Abnormal echocardiogram Plan:  Follow up with PCP  Consider trial of lasix or HCTZ to help with fluid retention  Consider repeat Echo     Return in about 4 months (around 10/16/2019), or if symptoms worsen or fail to improve, for Follow up with Dr. Lamonte Sakai.   Lauraine Rinne, NP 06/15/2019   This appointment was 42 minutes long with over 50% of the time in direct face-to-face patient  care, assessment, plan of care, and follow-up.

## 2019-06-15 NOTE — Assessment & Plan Note (Addendum)
Discussion: Pulmonary function today is normal.  Not showing any aspects of COPD.  Not showing any diffusion defects.  Not showing restriction.  Pulmonary function testing today was performed without patient's back brace.  Likely believe that dyspnea is directly related to physical deconditioning as well as her back brace which she has to wear for her degenerative sacral joint.  Patient also thought that she may have COPD PD that is more of a genetic component such as alpha 1.  We discussed testing for alpha 1.  I highly doubt that she has alpha-1 antitrypsin deficiency as she is 85 years old with a normal chest x-ray as well as normal pulmonary function testing.  I offered to perform alpha-1 testing to see if she is potentially a carrier.  She declined that today.  Plan: Walk today in office Work on increasing daily physical activity Follow-up with primary care Echocardiogram from 2017 by cardiology did have patient start taking Lasix. Weights are increasing.  I would recommend the patient follow-up with primary care or cardiology as pulmonary function testing has been normal.

## 2019-06-15 NOTE — Patient Instructions (Addendum)
You were seen today by Lauraine Rinne, NP  for:   1. Shortness of breath  Pulmonary function testing today is normal  If you would like to be tested for alpha-1 please contact her office and we can put in the order for lab test, I do find it highly unlikely though based off the fact that you are 85 years old with a normal chest x-ray  I would recommend that you follow-up with primary care as well as even potentially cardiology.  Weights are slowly increasing.  They could consider repeating your echocardiogram which is an ultrasound of your heart to see if you are having elevated pressures in your heart or potentially holding onto more fluid.  Has pulmonary function testing as well as x-ray normal.  Start increasing her overall physical activity  Walk today in office she tolerated well  Follow Up:    Return in about 4 months (around 10/16/2019), or if symptoms worsen or fail to improve, for Follow up with Dr. Lamonte Sakai.   Please do your part to reduce the spread of COVID-19:      Reduce your risk of any infection  and COVID19 by using the similar precautions used for avoiding the common cold or flu:  Marland Kitchen Wash your hands often with soap and warm water for at least 20 seconds.  If soap and water are not readily available, use an alcohol-based hand sanitizer with at least 60% alcohol.  . If coughing or sneezing, cover your mouth and nose by coughing or sneezing into the elbow areas of your shirt or coat, into a tissue or into your sleeve (not your hands). Langley Gauss A MASK when in public  . Avoid shaking hands with others and consider head nods or verbal greetings only. . Avoid touching your eyes, nose, or mouth with unwashed hands.  . Avoid close contact with people who are sick. . Avoid places or events with large numbers of people in one location, like concerts or sporting events. . If you have some symptoms but not all symptoms, continue to monitor at home and seek medical attention if your  symptoms worsen. . If you are having a medical emergency, call 911.   [place] / e-Visit: [email or web address]         [facility]: [facility]: [id number]                   [facility]: [id number]     It is flu season:   >>> Best ways to protect herself from the flu: Receive the yearly flu vaccine, practice good hand hygiene washing with soap and also using hand sanitizer when available, eat a nutritious meals, get adequate rest, hydrate appropriately   Please contact the office if your symptoms worsen or you have concerns that you are not improving.   Thank you for choosing [facility] for your healthcare, and for allowing [place] to partner with you on your healthcare journey. I am thankful to be able to provide care to you today.   Wyn Quaker FNP-C

## 2019-06-15 NOTE — Progress Notes (Signed)
PFT done today. 

## 2019-06-15 NOTE — Assessment & Plan Note (Signed)
Plan:  Follow up with PCP  Consider trial of lasix or HCTZ to help with fluid retention  Consider repeat Echo

## 2019-06-19 ENCOUNTER — Other Ambulatory Visit (HOSPITAL_COMMUNITY): Payer: Self-pay | Admitting: Internal Medicine

## 2019-06-19 DIAGNOSIS — R011 Cardiac murmur, unspecified: Secondary | ICD-10-CM

## 2019-06-20 ENCOUNTER — Ambulatory Visit (HOSPITAL_COMMUNITY): Payer: Medicare Other | Attending: Cardiology

## 2019-06-20 ENCOUNTER — Other Ambulatory Visit: Payer: Self-pay

## 2019-06-20 DIAGNOSIS — R011 Cardiac murmur, unspecified: Secondary | ICD-10-CM

## 2019-07-12 DIAGNOSIS — H04123 Dry eye syndrome of bilateral lacrimal glands: Secondary | ICD-10-CM | POA: Diagnosis not present

## 2019-07-12 DIAGNOSIS — Z961 Presence of intraocular lens: Secondary | ICD-10-CM | POA: Diagnosis not present

## 2019-07-12 DIAGNOSIS — L718 Other rosacea: Secondary | ICD-10-CM | POA: Diagnosis not present

## 2019-07-12 DIAGNOSIS — H11823 Conjunctivochalasis, bilateral: Secondary | ICD-10-CM | POA: Diagnosis not present

## 2019-07-12 DIAGNOSIS — H02889 Meibomian gland dysfunction of unspecified eye, unspecified eyelid: Secondary | ICD-10-CM | POA: Diagnosis not present

## 2019-07-13 ENCOUNTER — Encounter: Payer: Self-pay | Admitting: Cardiovascular Disease

## 2019-07-13 ENCOUNTER — Ambulatory Visit (INDEPENDENT_AMBULATORY_CARE_PROVIDER_SITE_OTHER): Payer: Medicare Other | Admitting: Cardiovascular Disease

## 2019-07-13 ENCOUNTER — Other Ambulatory Visit: Payer: Self-pay

## 2019-07-13 VITALS — BP 117/70 | HR 89 | Temp 96.6°F | Ht 65.0 in | Wt 181.0 lb

## 2019-07-13 DIAGNOSIS — R0602 Shortness of breath: Secondary | ICD-10-CM | POA: Diagnosis not present

## 2019-07-13 DIAGNOSIS — C9110 Chronic lymphocytic leukemia of B-cell type not having achieved remission: Secondary | ICD-10-CM

## 2019-07-13 DIAGNOSIS — Z8719 Personal history of other diseases of the digestive system: Secondary | ICD-10-CM | POA: Diagnosis not present

## 2019-07-13 NOTE — Patient Instructions (Signed)
Medication Instructions:  No changes *If you need a refill on your cardiac medications before your next appointment, please call your pharmacy*  Lab Work: None ordered If you have labs (blood work) drawn today and your tests are completely normal, you will receive your results only by: Marland Kitchen MyChart Message (if you have MyChart) OR . A paper copy in the mail If you have any lab test that is abnormal or we need to change your treatment, we will call you to review the results.  Testing/Procedures: None ordered  Follow-Up: At [facility], you and your health needs are our priority.  As part of our continuing mission to provide you with exceptional heart care, we have created designated Provider Care Teams.  These Care Teams include your primary Cardiologist (physician) and Advanced Practice Providers (APPs -  Physician Assistants and Nurse Practitioners) who all work together to provide you with the care you need, when you need it.  Your next appointment:   As needed with Dr. Sallyanne Kuster

## 2019-07-13 NOTE — Progress Notes (Signed)
Cardiology Office Note    Date:  07/15/2019   ID:  Brenda [name], DOB [birth year], MRN [id number]  PCP:  Crist Infante, MD  Cardiologist:   Sanda Klein, MD  Chief complaint: Dyspnea   History of Present Illness:  Brenda [name] is a 85 y.o. female with a history of CLL, presents for complaints of dyspnea.  Her complaints are similar to what she described in 2012 and 2017, when she had noninvasive cardiac work-up performed.  Shortness of breath is not always present, and occur both at rest and with activity.  It seems to occur more frequently with counter for prolonged periods of time and associates low back pain.  It will also occur if she bends over after a meal.  It does not occur when she does other types of light housework or climbing the stairs.  She does not have chest discomfort either at rest or with activity.  She denies palpitations, dizziness, syncope, edema, orthopnea or paroxysmal nocturnal dyspnea.  She has not had falls or bleeding problems.  She denies headaches, intermittent claudication or focal neurological events.  She was referred for repeat evaluation of pulmonary function tests (performed June 15, 2019, normal) and echocardiogram (performed June 20, 2019 with normal left ventricular systolic and diastolic function and no valvular abnormalities).  In fact, on my review of her echo, diastolic function parameters are better than average for an octogenarian.  In 2012 she had a normal myocardial perfusion study and also wore an event monitor that showed infrequent and very brief episodes of nonsustained atrial tachycardia.  Echocardiograms in 2012 and 2017 also showed normal findings.  She has a normal electrocardiogram today.  She takes a statin and ezetimibe and has a satisfactory lipid profile.  She does not have diabetes mellitus and does not smoke.  She is no longer taking proton pump inhibitors.  Her labs show mild lymphocytosis, without anemia or  thrombocytopenia.   Past Medical History:  Diagnosis Date  . Anxiety   . Arthritis   . CLL (chronic lymphocytic leukemia) ([place])   . Complication of anesthesia   . Depression   . Depression with anxiety   . Diverticulosis   . Dyspnea   . GERD (gastroesophageal reflux disease)   . Hearing loss   . Memory loss   . Mild cognitive impairment   . Osteopenia   . PONV (postoperative nausea and vomiting)   . Reflux   . SOB (shortness of breath)     Past Surgical History:  Procedure Laterality Date  . ABDOMINAL HYSTERECTOMY    . APPENDECTOMY    . BREAST EXCISIONAL BIOPSY Left   . CARDIAC EVENT MONITOR  02/03/2005   30 days, normal event monitor  . CATARACT EXTRACTION Bilateral   . EYE SURGERY     squamous cell carcinoma surgery in right eye lid.  Marland Kitchen KNEE ARTHROSCOPY Right 07/02/2016   Procedure: ARTHROSCOPY KNEE WITH DEBRIDEMENT, PARTIAL LATERAL AND MEDIAL MENISCECTOMY, with drainage of cyst;  Surgeon: Susa Day, MD;  Location: [facility];  Service: Orthopedics;  Laterality: Right;  . NM GATED MYOCARDIAL STUDY (ARMX HX)  01/23/2011   Normal pattern of perfusion in all regions. post-stress EF is 85%. EKG negative for ischemia. no ECG changes.  . TONSILLECTOMY    . TRANSTHORACIC ECHOCARDIOGRAM  10/31/2010   EF >55%, there is concern for inferoacpical infarct with possible inferoapical aneurysm or pseudoaneurysm - could be echo artificat    Current Medications: Outpatient Medications Prior to Visit  Medication Sig Dispense Refill  . atorvastatin (LIPITOR) 20 MG tablet Take 20 mg by mouth daily at 6 PM.    . desvenlafaxine (PRISTIQ) 50 MG 24 hr tablet desvenlafaxine succinate ER 50 mg tablet,extended release 24 hr    . estradiol (ESTRING) 2 MG vaginal ring Place 2 mg vaginally every 3 (three) months. follow package directions    . famotidine (PEPCID) 40 MG tablet Take 40 mg by mouth at bedtime.    Marland Kitchen LORazepam (ATIVAN) 0.5 MG tablet Take 0.5 mg by mouth at bedtime.     . Multiple  Vitamin (MULTIVITAMIN) capsule Take 1 capsule by mouth daily.    . Multiple Vitamins-Minerals (PRESERVISION AREDS 2+MULTI VIT PO) PreserVision AREDS    . MYRBETRIQ 50 MG TB24 tablet     . nystatin (MYCOSTATIN) 100000 UNIT/ML suspension Take by mouth.    Marland Kitchen OVER THE COUNTER MEDICATION thera tears prn dry ears    . solifenacin (VESICARE) 5 MG tablet     . White Petrolatum-Mineral Oil (SYSTANE NIGHTTIME) OINT Place into both eyes nightly.    Marland Kitchen ZETIA 10 MG tablet Take 10 mg by mouth at bedtime.     Marland Kitchen zolpidem (AMBIEN) 5 MG tablet Take 5 mg by mouth at bedtime.      No facility-administered medications prior to visit.      Allergies:   Adhesive [tape], Codeine, Doxycycline, and Sulfa antibiotics   Social History   Socioeconomic History  . Marital status: Married    Spouse name: Brenda [name]  . Number of children: 2  . Years of education: college  . Highest education level: Not on file  Occupational History  . Not on file  Social Needs  . Financial resource strain: Not on file  . Food insecurity    Worry: Not on file    Inability: Not on file  . Transportation needs    Medical: Not on file    Non-medical: Not on file  Tobacco Use  . Smoking status: Never Smoker  . Smokeless tobacco: Never Used  Substance and Sexual Activity  . Alcohol use: No  . Drug use: No  . Sexual activity: Not on file  Lifestyle  . Physical activity    Days per week: Not on file    Minutes per session: Not on file  . Stress: Not on file  Relationships  . Social Herbalist on phone: Not on file    Gets together: Not on file    Attends religious service: Not on file    Active member of club or organization: Not on file    Attends meetings of clubs or organizations: Not on file    Relationship status: Not on file  Other Topics Concern  . Not on file  Social History Narrative   Patient lives at home with husband Brenda [name].    Patient is retired.    Patient has a college education.    Patient has  2 children.    Patient drinks 2 cups of coffee daily.      Family History:  The patient's family history includes Alzheimer's disease in her father; Atrial fibrillation in her brother; Dementia in her father, maternal grandmother, and paternal grandmother.   ROS:   Please see the history of present illness.    ROS All other systems are reviewed and are negative.   PHYSICAL EXAM:   VS:  BP 117/70   Pulse 89   Temp (!) 96.6 F (35.9 C)   Ht  5\' 5"  (1.651 m)   Wt 181 lb (82.1 kg)   SpO2 97%   BMI 30.12 kg/m     General: Alert, oriented x3, no distress, appears younger than her stated age Head: no evidence of trauma, PERRL, EOMI, no exophtalmos or lid lag, no myxedema, no xanthelasma; normal ears, nose and oropharynx Neck: normal jugular venous pulsations and no hepatojugular reflux; brisk carotid pulses without delay and no carotid bruits Chest: clear to auscultation, no signs of consolidation by percussion or palpation, normal fremitus, symmetrical and full respiratory excursions Cardiovascular: normal position and quality of the apical impulse, regular rhythm, normal first and second heart sounds, no murmurs, rubs or gallops Abdomen: no tenderness or distention, no masses by palpation, no abnormal pulsatility or arterial bruits, normal bowel sounds, no hepatosplenomegaly Extremities: no clubbing, cyanosis or edema; 2+ radial, ulnar and brachial pulses bilaterally; 2+ right femoral, posterior tibial and dorsalis pedis pulses; 2+ left femoral, posterior tibial and dorsalis pedis pulses; no subclavian or femoral bruits Neurological: grossly nonfocal Psych: Normal mood and affect   Wt Readings from Last 3 Encounters:  07/13/19 181 lb (82.1 kg)  06/15/19 180 lb (81.6 kg)  04/17/19 178 lb 6.4 oz (80.9 kg)      Studies/Labs Reviewed:   EKG:  EKG is ordered today.  It shows normal sinus rhythm and is a normal tracing  BMET    Component Value Date/Time   NA 135 04/17/2019 1603    K 4.3 04/17/2019 1603   CL 99 04/17/2019 1603   CO2 29 04/17/2019 1603   GLUCOSE 99 04/17/2019 1603   BUN 16 04/17/2019 1603   CREATININE 0.72 04/17/2019 1603   CALCIUM 9.4 04/17/2019 1603   GFRNONAA >60 06/26/2016 1045   GFRAA >60 06/26/2016 1045   ECHO 06/20/2019  1. Left ventricular ejection fraction, by visual estimation, is 60 to 65%. The left ventricle has normal function. Normal left ventricular size. There is no left ventricular hypertrophy.  2. Global right ventricle has normal systolic function.The right ventricular size is normal. No increase in right ventricular wall thickness.  3. Left atrial size was normal.  4. Right atrial size was normal.  5. The mitral valve is normal in structure. Trace mitral valve regurgitation.  6. The tricuspid valve is normal in structure. Tricuspid valve regurgitation was not visualized by color flow Doppler.  7. The aortic valve is tricuspid Aortic valve regurgitation was not visualized by color flow Doppler.  8. The pulmonic valve was not well visualized. Pulmonic valve regurgitation is not visualized by color flow Doppler.  9. The inferior vena cava is normal in size with greater than 50% respiratory variability, suggesting right atrial pressure of 3 mmHg.  Lipid Panel 06/27/2015 total cholesterol 172, triglycerides 137, HDL 46, LDL 99   ASSESSMENT:    1. Shortness of breath   2. History of gastroesophageal reflux (GERD)   3. CLL (chronic lymphocytic leukemia) ([facility])      PLAN:  In order of problems listed above:  1. Dyspnea: As before, I doubt that her symptoms are cardiac in etiology.  She has no evidence of structural heart disease by a very recent echocardiogram and has normal left ventricular systolic and diastolic function.  She does not have angina pectoris and has a normal electrocardiogram.  Her nuclear stress test was low risk (albeit remotely in 2012).  PFTs were also normal her symptoms are not lifestyle limiting: I do not  think they justify invasive procedures such as a right and  left heart catheterization.   2. Hx GERD: As a trial, it might be worthwhile restarting proton pump inhibitors to see if this leads to any improvement in her dyspnea. 3. CLL: Appears to be early in the course of this illness, without evidence of bone marrow  suppression.     Medication Adjustments/Labs and Tests Ordered: Current medicines are reviewed at length with the patient today.  Concerns regarding medicines are outlined above.  Medication changes, Labs and Tests ordered today are listed in the Patient Instructions below. Patient Instructions  Medication Instructions:  No changes *If you need a refill on your cardiac medications before your next appointment, please call your pharmacy*  Lab Work: None ordered If you have labs (blood work) drawn today and your tests are completely normal, you will receive your results only by: Marland Kitchen MyChart Message (if you have MyChart) OR . A paper copy in the mail If you have any lab test that is abnormal or we need to change your treatment, we will call you to review the results.  Testing/Procedures: None ordered  Follow-Up: At [facility], you and your health needs are our priority.  As part of our continuing mission to provide you with exceptional heart care, we have created designated Provider Care Teams.  These Care Teams include your primary Cardiologist (physician) and Advanced Practice Providers (APPs -  Physician Assistants and Nurse Practitioners) who all work together to provide you with the care you need, when you need it.  Your next appointment:   As needed with Dr. Sallyanne Kuster     Signed, Sanda Klein, MD  07/15/2019 9:57 AM    [address], [place], [place]  [address] Phone: [phone]; Fax: [phone]

## 2019-07-15 ENCOUNTER — Encounter: Payer: Self-pay | Admitting: Cardiovascular Disease

## 2019-07-19 DIAGNOSIS — K1379 Other lesions of oral mucosa: Secondary | ICD-10-CM | POA: Diagnosis not present

## 2019-07-19 DIAGNOSIS — B370 Candidal stomatitis: Secondary | ICD-10-CM | POA: Diagnosis not present

## 2019-08-01 DIAGNOSIS — B370 Candidal stomatitis: Secondary | ICD-10-CM | POA: Diagnosis not present

## 2019-08-01 DIAGNOSIS — J449 Chronic obstructive pulmonary disease, unspecified: Secondary | ICD-10-CM | POA: Diagnosis not present

## 2019-08-01 DIAGNOSIS — J069 Acute upper respiratory infection, unspecified: Secondary | ICD-10-CM | POA: Diagnosis not present

## 2019-08-09 DIAGNOSIS — Z85828 Personal history of other malignant neoplasm of skin: Secondary | ICD-10-CM | POA: Diagnosis not present

## 2019-08-09 DIAGNOSIS — L570 Actinic keratosis: Secondary | ICD-10-CM | POA: Diagnosis not present

## 2019-08-09 DIAGNOSIS — D1801 Hemangioma of skin and subcutaneous tissue: Secondary | ICD-10-CM | POA: Diagnosis not present

## 2019-08-09 DIAGNOSIS — L821 Other seborrheic keratosis: Secondary | ICD-10-CM | POA: Diagnosis not present

## 2019-08-21 DIAGNOSIS — R040 Epistaxis: Secondary | ICD-10-CM | POA: Diagnosis not present

## 2019-08-21 DIAGNOSIS — J3489 Other specified disorders of nose and nasal sinuses: Secondary | ICD-10-CM | POA: Diagnosis not present

## 2019-08-21 DIAGNOSIS — J00 Acute nasopharyngitis [common cold]: Secondary | ICD-10-CM | POA: Diagnosis not present

## 2019-09-07 DIAGNOSIS — K1379 Other lesions of oral mucosa: Secondary | ICD-10-CM | POA: Diagnosis not present

## 2019-09-07 DIAGNOSIS — K146 Glossodynia: Secondary | ICD-10-CM | POA: Diagnosis not present

## 2019-09-07 DIAGNOSIS — R448 Other symptoms and signs involving general sensations and perceptions: Secondary | ICD-10-CM | POA: Diagnosis not present

## 2019-09-20 DIAGNOSIS — H02831 Dermatochalasis of right upper eyelid: Secondary | ICD-10-CM | POA: Diagnosis not present

## 2019-09-20 DIAGNOSIS — H04123 Dry eye syndrome of bilateral lacrimal glands: Secondary | ICD-10-CM | POA: Diagnosis not present

## 2019-09-20 DIAGNOSIS — H02834 Dermatochalasis of left upper eyelid: Secondary | ICD-10-CM | POA: Diagnosis not present

## 2019-09-20 DIAGNOSIS — H02889 Meibomian gland dysfunction of unspecified eye, unspecified eyelid: Secondary | ICD-10-CM | POA: Diagnosis not present

## 2019-09-20 DIAGNOSIS — L718 Other rosacea: Secondary | ICD-10-CM | POA: Diagnosis not present

## 2019-09-20 DIAGNOSIS — Z961 Presence of intraocular lens: Secondary | ICD-10-CM | POA: Diagnosis not present

## 2019-10-24 DIAGNOSIS — Z961 Presence of intraocular lens: Secondary | ICD-10-CM | POA: Diagnosis not present

## 2019-10-24 DIAGNOSIS — H04123 Dry eye syndrome of bilateral lacrimal glands: Secondary | ICD-10-CM | POA: Diagnosis not present

## 2019-10-24 DIAGNOSIS — H11823 Conjunctivochalasis, bilateral: Secondary | ICD-10-CM | POA: Diagnosis not present

## 2019-10-24 DIAGNOSIS — H02886 Meibomian gland dysfunction of left eye, unspecified eyelid: Secondary | ICD-10-CM | POA: Diagnosis not present

## 2019-10-24 DIAGNOSIS — H02883 Meibomian gland dysfunction of right eye, unspecified eyelid: Secondary | ICD-10-CM | POA: Diagnosis not present

## 2019-10-24 DIAGNOSIS — L718 Other rosacea: Secondary | ICD-10-CM | POA: Diagnosis not present

## 2019-11-01 DIAGNOSIS — K2100 Gastro-esophageal reflux disease with esophagitis, without bleeding: Secondary | ICD-10-CM | POA: Diagnosis not present

## 2019-11-01 DIAGNOSIS — K449 Diaphragmatic hernia without obstruction or gangrene: Secondary | ICD-10-CM | POA: Diagnosis not present

## 2019-11-28 ENCOUNTER — Other Ambulatory Visit: Payer: Self-pay | Admitting: Internal Medicine

## 2019-11-28 DIAGNOSIS — Z1231 Encounter for screening mammogram for malignant neoplasm of breast: Secondary | ICD-10-CM

## 2020-01-12 DIAGNOSIS — M48061 Spinal stenosis, lumbar region without neurogenic claudication: Secondary | ICD-10-CM | POA: Diagnosis not present

## 2020-01-12 DIAGNOSIS — C9100 Acute lymphoblastic leukemia not having achieved remission: Secondary | ICD-10-CM | POA: Diagnosis not present

## 2020-01-12 DIAGNOSIS — K219 Gastro-esophageal reflux disease without esophagitis: Secondary | ICD-10-CM | POA: Diagnosis not present

## 2020-01-12 DIAGNOSIS — R5383 Other fatigue: Secondary | ICD-10-CM | POA: Diagnosis not present

## 2020-01-12 DIAGNOSIS — M25569 Pain in unspecified knee: Secondary | ICD-10-CM | POA: Diagnosis not present

## 2020-01-12 DIAGNOSIS — F329 Major depressive disorder, single episode, unspecified: Secondary | ICD-10-CM | POA: Diagnosis not present

## 2020-01-12 DIAGNOSIS — R011 Cardiac murmur, unspecified: Secondary | ICD-10-CM | POA: Diagnosis not present

## 2020-01-12 DIAGNOSIS — K1379 Other lesions of oral mucosa: Secondary | ICD-10-CM | POA: Diagnosis not present

## 2020-01-18 DIAGNOSIS — K1329 Other disturbances of oral epithelium, including tongue: Secondary | ICD-10-CM | POA: Diagnosis not present

## 2020-01-24 DIAGNOSIS — E7849 Other hyperlipidemia: Secondary | ICD-10-CM | POA: Diagnosis not present

## 2020-01-24 DIAGNOSIS — R5383 Other fatigue: Secondary | ICD-10-CM | POA: Diagnosis not present

## 2020-01-24 DIAGNOSIS — K146 Glossodynia: Secondary | ICD-10-CM | POA: Diagnosis not present

## 2020-01-24 DIAGNOSIS — R7301 Impaired fasting glucose: Secondary | ICD-10-CM | POA: Diagnosis not present

## 2020-01-24 DIAGNOSIS — F329 Major depressive disorder, single episode, unspecified: Secondary | ICD-10-CM | POA: Diagnosis not present

## 2020-01-31 DIAGNOSIS — R011 Cardiac murmur, unspecified: Secondary | ICD-10-CM | POA: Diagnosis not present

## 2020-01-31 DIAGNOSIS — E7849 Other hyperlipidemia: Secondary | ICD-10-CM | POA: Diagnosis not present

## 2020-01-31 DIAGNOSIS — M48061 Spinal stenosis, lumbar region without neurogenic claudication: Secondary | ICD-10-CM | POA: Diagnosis not present

## 2020-01-31 DIAGNOSIS — F329 Major depressive disorder, single episode, unspecified: Secondary | ICD-10-CM | POA: Diagnosis not present

## 2020-01-31 DIAGNOSIS — R600 Localized edema: Secondary | ICD-10-CM | POA: Diagnosis not present

## 2020-01-31 DIAGNOSIS — Z1331 Encounter for screening for depression: Secondary | ICD-10-CM | POA: Diagnosis not present

## 2020-01-31 DIAGNOSIS — Z0000 Encounter for general adult medical examination without abnormal findings: Secondary | ICD-10-CM | POA: Diagnosis not present

## 2020-01-31 DIAGNOSIS — J449 Chronic obstructive pulmonary disease, unspecified: Secondary | ICD-10-CM | POA: Diagnosis not present

## 2020-01-31 DIAGNOSIS — C9100 Acute lymphoblastic leukemia not having achieved remission: Secondary | ICD-10-CM | POA: Diagnosis not present

## 2020-01-31 DIAGNOSIS — N390 Urinary tract infection, site not specified: Secondary | ICD-10-CM | POA: Diagnosis not present

## 2020-03-21 DIAGNOSIS — H02831 Dermatochalasis of right upper eyelid: Secondary | ICD-10-CM | POA: Diagnosis not present

## 2020-03-21 DIAGNOSIS — H02834 Dermatochalasis of left upper eyelid: Secondary | ICD-10-CM | POA: Diagnosis not present

## 2020-03-21 DIAGNOSIS — Z961 Presence of intraocular lens: Secondary | ICD-10-CM | POA: Diagnosis not present

## 2020-04-04 DIAGNOSIS — Z85828 Personal history of other malignant neoplasm of skin: Secondary | ICD-10-CM | POA: Diagnosis not present

## 2020-04-04 DIAGNOSIS — L821 Other seborrheic keratosis: Secondary | ICD-10-CM | POA: Diagnosis not present

## 2020-04-22 DIAGNOSIS — H11823 Conjunctivochalasis, bilateral: Secondary | ICD-10-CM | POA: Diagnosis not present

## 2020-04-22 DIAGNOSIS — H02886 Meibomian gland dysfunction of left eye, unspecified eyelid: Secondary | ICD-10-CM | POA: Diagnosis not present

## 2020-04-22 DIAGNOSIS — Z961 Presence of intraocular lens: Secondary | ICD-10-CM | POA: Diagnosis not present

## 2020-04-22 DIAGNOSIS — H02831 Dermatochalasis of right upper eyelid: Secondary | ICD-10-CM | POA: Diagnosis not present

## 2020-04-22 DIAGNOSIS — Z9889 Other specified postprocedural states: Secondary | ICD-10-CM | POA: Diagnosis not present

## 2020-04-22 DIAGNOSIS — H02883 Meibomian gland dysfunction of right eye, unspecified eyelid: Secondary | ICD-10-CM | POA: Diagnosis not present

## 2020-04-22 DIAGNOSIS — H02834 Dermatochalasis of left upper eyelid: Secondary | ICD-10-CM | POA: Diagnosis not present

## 2020-04-22 DIAGNOSIS — L718 Other rosacea: Secondary | ICD-10-CM | POA: Diagnosis not present

## 2020-04-30 DIAGNOSIS — N3942 Incontinence without sensory awareness: Secondary | ICD-10-CM | POA: Diagnosis not present

## 2020-04-30 DIAGNOSIS — N3000 Acute cystitis without hematuria: Secondary | ICD-10-CM | POA: Diagnosis not present

## 2020-06-04 DIAGNOSIS — M545 Low back pain, unspecified: Secondary | ICD-10-CM | POA: Diagnosis not present

## 2020-06-04 DIAGNOSIS — M170 Bilateral primary osteoarthritis of knee: Secondary | ICD-10-CM | POA: Diagnosis not present

## 2020-06-05 DIAGNOSIS — Z23 Encounter for immunization: Secondary | ICD-10-CM | POA: Diagnosis not present

## 2020-06-05 DIAGNOSIS — N3020 Other chronic cystitis without hematuria: Secondary | ICD-10-CM | POA: Diagnosis not present

## 2020-06-18 DIAGNOSIS — Z23 Encounter for immunization: Secondary | ICD-10-CM | POA: Diagnosis not present

## 2020-07-09 DIAGNOSIS — H02883 Meibomian gland dysfunction of right eye, unspecified eyelid: Secondary | ICD-10-CM | POA: Diagnosis not present

## 2020-07-09 DIAGNOSIS — Z961 Presence of intraocular lens: Secondary | ICD-10-CM | POA: Diagnosis not present

## 2020-07-09 DIAGNOSIS — H11823 Conjunctivochalasis, bilateral: Secondary | ICD-10-CM | POA: Diagnosis not present

## 2020-07-09 DIAGNOSIS — L718 Other rosacea: Secondary | ICD-10-CM | POA: Diagnosis not present

## 2020-07-09 DIAGNOSIS — H04123 Dry eye syndrome of bilateral lacrimal glands: Secondary | ICD-10-CM | POA: Diagnosis not present

## 2020-07-09 DIAGNOSIS — H02886 Meibomian gland dysfunction of left eye, unspecified eyelid: Secondary | ICD-10-CM | POA: Diagnosis not present

## 2020-07-12 ENCOUNTER — Ambulatory Visit (INDEPENDENT_AMBULATORY_CARE_PROVIDER_SITE_OTHER): Payer: Medicare Other | Admitting: Cardiovascular Disease

## 2020-07-12 ENCOUNTER — Other Ambulatory Visit: Payer: Self-pay

## 2020-07-12 ENCOUNTER — Encounter: Payer: Self-pay | Admitting: Cardiovascular Disease

## 2020-07-12 VITALS — BP 129/71 | HR 84 | Ht 66.0 in | Wt 177.2 lb

## 2020-07-12 DIAGNOSIS — R0602 Shortness of breath: Secondary | ICD-10-CM

## 2020-07-12 NOTE — Progress Notes (Signed)
Cardiology Office Note    Date:  07/12/2020   ID:  Brenda [name], DOB 09/20/1931, MRN [id number]  PCP:  Crist Infante, MD  Cardiologist:   Sanda Klein, MD  Chief complaint: Dyspnea   History of Present Illness:  Brenda [name] is a 86 y.o. female with a history of CLL, presents for complaints of dyspnea.  She has less in the way of any cardiovascular complaints, unfortunately this is because of lower levels of physical activity.  She has problems with her lower back and has to wear back brace.  She spends all her time on the lower level of her home and does not go up the stairs.  She can do light housework.  She is never limited by dyspnea or other cardiovascular complaints, but rather by her lower back.  In 2012 she had a normal myocardial perfusion study and also wore an event monitor that showed infrequent and very brief episodes of nonsustained atrial tachycardia.  Echocardiograms in 2012 and 2017 and 2020 he had also showed normal findings.  She has a normal electrocardiogram today.  She takes a statin and ezetimibe and has a satisfactory lipid profile.  She does not have diabetes mellitus and does not smoke.  She is no longer taking proton pump inhibitors.  Her labs show mild lymphocytosis, without anemia or thrombocytopenia.  At her most recent appointment, Dr. Joylene Draft told her that he does not think that the diagnosis of CLL is accurate.  Next few weeks, she will be celebrating her husband's 91st birthday and their 62nd wedding anniversary.   Past Medical History:  Diagnosis Date  . Anxiety   . Arthritis   . CLL (chronic lymphocytic leukemia) ([place])   . Complication of anesthesia   . Depression   . Depression with anxiety   . Diverticulosis   . Dyspnea   . GERD (gastroesophageal reflux disease)   . Hearing loss   . Memory loss   . Mild cognitive impairment   . Osteopenia   . PONV (postoperative nausea and vomiting)   . Reflux   . SOB (shortness of breath)      Past Surgical History:  Procedure Laterality Date  . ABDOMINAL HYSTERECTOMY    . APPENDECTOMY    . BREAST EXCISIONAL BIOPSY Left   . CARDIAC EVENT MONITOR  02/03/2005   30 days, normal event monitor  . CATARACT EXTRACTION Bilateral   . EYE SURGERY     squamous cell carcinoma surgery in right eye lid.  Marland Kitchen KNEE ARTHROSCOPY Right 07/02/2016   Procedure: ARTHROSCOPY KNEE WITH DEBRIDEMENT, PARTIAL LATERAL AND MEDIAL MENISCECTOMY, with drainage of cyst;  Surgeon: Susa Day, MD;  Location: [facility];  Service: Orthopedics;  Laterality: Right;  . NM GATED MYOCARDIAL STUDY (ARMX HX)  01/23/2011   Normal pattern of perfusion in all regions. post-stress EF is 85%. EKG negative for ischemia. no ECG changes.  . TONSILLECTOMY    . TRANSTHORACIC ECHOCARDIOGRAM  10/31/2010   EF >55%, there is concern for inferoacpical infarct with possible inferoapical aneurysm or pseudoaneurysm - could be echo artificat    Current Medications: Outpatient Medications Prior to Visit  Medication Sig Dispense Refill  . atorvastatin (LIPITOR) 20 MG tablet Take 20 mg by mouth daily at 6 PM.    . desvenlafaxine (PRISTIQ) 50 MG 24 hr tablet desvenlafaxine succinate ER 50 mg tablet,extended release 24 hr    . estradiol (ESTRING) 2 MG vaginal ring Place 2 mg vaginally every 3 (three) months. follow package  directions    . LORazepam (ATIVAN) 0.5 MG tablet Take 0.5 mg by mouth at bedtime.     . Multiple Vitamin (MULTIVITAMIN) capsule Take 1 capsule by mouth daily.    . Multiple Vitamins-Minerals (PRESERVISION AREDS 2+MULTI VIT PO) PreserVision AREDS    . MYRBETRIQ 50 MG TB24 tablet     . nystatin (MYCOSTATIN) 100000 UNIT/ML suspension Take by mouth.    Marland Kitchen OVER THE COUNTER MEDICATION thera tears prn dry ears    . solifenacin (VESICARE) 5 MG tablet     . White Petrolatum-Mineral Oil (SYSTANE NIGHTTIME) OINT Place into both eyes nightly.    Marland Kitchen ZETIA 10 MG tablet Take 10 mg by mouth at bedtime.     Marland Kitchen zolpidem (AMBIEN) 5 MG  tablet Take 5 mg by mouth at bedtime.     . famotidine (PEPCID) 40 MG tablet Take 40 mg by mouth at bedtime.     No facility-administered medications prior to visit.     Allergies:   Adhesive [tape], Codeine, Doxycycline, and Sulfa antibiotics   Social History   Socioeconomic History  . Marital status: Married    Spouse name: [name]  . Number of children: 2  . Years of education: college  . Highest education level: Not on file  Occupational History  . Not on file  Tobacco Use  . Smoking status: Never Smoker  . Smokeless tobacco: Never Used  Vaping Use  . Vaping Use: Never used  Substance and Sexual Activity  . Alcohol use: No  . Drug use: No  . Sexual activity: Not on file  Other Topics Concern  . Not on file  Social History Narrative   Patient lives at home with husband [name].    Patient is retired.    Patient has a college education.    Patient has 2 children.    Patient drinks 2 cups of coffee daily.    Social Determinants of Health   Financial Resource Strain:   . Difficulty of Paying Living Expenses: Not on file  Food Insecurity:   . Worried About Charity fundraiser in the Last Year: Not on file  . Ran Out of Food in the Last Year: Not on file  Transportation Needs:   . Lack of Transportation (Medical): Not on file  . Lack of Transportation (Non-Medical): Not on file  Physical Activity:   . Days of Exercise per Week: Not on file  . Minutes of Exercise per Session: Not on file  Stress:   . Feeling of Stress : Not on file  Social Connections:   . Frequency of Communication with Friends and Family: Not on file  . Frequency of Social Gatherings with Friends and Family: Not on file  . Attends Religious Services: Not on file  . Active Member of Clubs or Organizations: Not on file  . Attends Archivist Meetings: Not on file  . Marital Status: Not on file     Family History:  The patient's family history includes Alzheimer's disease in her father;  Atrial fibrillation in her brother; Dementia in her father, maternal grandmother, and paternal grandmother.   ROS:   Please see the history of present illness.    ROS All other systems are reviewed and are negative.   PHYSICAL EXAM:   VS:  BP 129/71   Pulse 84   Ht 5\' 6"  (1.676 m)   Wt 177 lb 3.2 oz (80.4 kg)   SpO2 97%   BMI 28.60 kg/m  General: Alert, oriented x3, no distress, appears younger than stated age. Head: no evidence of trauma, PERRL, EOMI, no exophtalmos or lid lag, no myxedema, no xanthelasma; normal ears, nose and oropharynx Neck: normal jugular venous pulsations and no hepatojugular reflux; brisk carotid pulses without delay and no carotid bruits Chest: clear to auscultation, no signs of consolidation by percussion or palpation, normal fremitus, symmetrical and full respiratory excursions Cardiovascular: normal position and quality of the apical impulse, regular rhythm, normal first and second heart sounds, no murmurs, rubs or gallops Abdomen: no tenderness or distention, no masses by palpation, no abnormal pulsatility or arterial bruits, normal bowel sounds, no hepatosplenomegaly Extremities: no clubbing, cyanosis or edema; 2+ radial, ulnar and brachial pulses bilaterally; 2+ right femoral, posterior tibial and dorsalis pedis pulses; 2+ left femoral, posterior tibial and dorsalis pedis pulses; no subclavian or femoral bruits Neurological: grossly nonfocal Psych: Normal mood and affect   Wt Readings from Last 3 Encounters:  07/12/20 177 lb 3.2 oz (80.4 kg)  07/13/19 181 lb (82.1 kg)  06/15/19 180 lb (81.6 kg)      Studies/Labs Reviewed:   EKG:  EKG is ordered today. It shows NSR with a single PAC, otw normal  BMET    Component Value Date/Time   NA 135 04/17/2019 1603   K 4.3 04/17/2019 1603   CL 99 04/17/2019 1603   CO2 29 04/17/2019 1603   GLUCOSE 99 04/17/2019 1603   BUN 16 04/17/2019 1603   CREATININE 0.72 04/17/2019 1603   CALCIUM 9.4  04/17/2019 1603   GFRNONAA >60 06/26/2016 1045   GFRAA >60 06/26/2016 1045   ECHO 06/20/2019  1. Left ventricular ejection fraction, by visual estimation, is 60 to 65%. The left ventricle has normal function. Normal left ventricular size. There is no left ventricular hypertrophy.  2. Global right ventricle has normal systolic function.The right ventricular size is normal. No increase in right ventricular wall thickness.  3. Left atrial size was normal.  4. Right atrial size was normal.  5. The mitral valve is normal in structure. Trace mitral valve regurgitation.  6. The tricuspid valve is normal in structure. Tricuspid valve regurgitation was not visualized by color flow Doppler.  7. The aortic valve is tricuspid Aortic valve regurgitation was not visualized by color flow Doppler.  8. The pulmonic valve was not well visualized. Pulmonic valve regurgitation is not visualized by color flow Doppler.  9. The inferior vena cava is normal in size with greater than 50% respiratory variability, suggesting right atrial pressure of 3 mmHg.  Lipid Panel 06/27/2015 total cholesterol 172, triglycerides 137, HDL 46, LDL 99   ASSESSMENT:    1. Shortness of breath      PLAN:  In order of problems listed above:  1. Dyspnea: This has not been a problem recently, unfortunately regular activities limited by her back.  Previous investigation has not shown any evidence of structural heart disease.  Follow-up as needed if her symptoms recur. 2. CLL: Apparently this is now a questionable diagnosis.     Medication Adjustments/Labs and Tests Ordered: Current medicines are reviewed at length with the patient today.  Concerns regarding medicines are outlined above.  Medication changes, Labs and Tests ordered today are listed in the Patient Instructions below. Patient Instructions  Medication Instructions:  No changes *If you need a refill on your cardiac medications before your next appointment, please  call your pharmacy*   Lab Work: None ordered If you have labs (blood work) drawn today and your  tests are completely normal, you will receive your results only by: Marland Kitchen MyChart Message (if you have MyChart) OR . A paper copy in the mail If you have any lab test that is abnormal or we need to change your treatment, we will call you to review the results.   Testing/Procedures: None ordered   Follow-Up: At [facility], you and your health needs are our priority.  As part of our continuing mission to provide you with exceptional heart care, we have created designated Provider Care Teams.  These Care Teams include your primary Cardiologist (physician) and Advanced Practice Providers (APPs -  Physician Assistants and Nurse Practitioners) who all work together to provide you with the care you need, when you need it.  We recommend signing up for the patient portal called "MyChart".  Sign up information is provided on this After Visit Summary.  MyChart is used to connect with patients for Virtual Visits (Telemedicine).  Patients are able to view lab/test results, encounter notes, upcoming appointments, etc.  Non-urgent messages can be sent to your provider as well.   To learn more about what you can do with MyChart, go to [email or web address].    Your next appointment:   Follow up as needed    Signed, Sanda Klein, MD  07/12/2020 8:39 AM    [address], [place], [place]  [address] Phone: [phone]; Fax: [phone]

## 2020-07-12 NOTE — Patient Instructions (Signed)
Medication Instructions:  No changes *If you need a refill on your cardiac medications before your next appointment, please call your pharmacy*   Lab Work: None ordered If you have labs (blood work) drawn today and your tests are completely normal, you will receive your results only by: Marland Kitchen MyChart Message (if you have MyChart) OR . A paper copy in the mail If you have any lab test that is abnormal or we need to change your treatment, we will call you to review the results.   Testing/Procedures: None ordered   Follow-Up: At [facility], you and your health needs are our priority.  As part of our continuing mission to provide you with exceptional heart care, we have created designated Provider Care Teams.  These Care Teams include your primary Cardiologist (physician) and Advanced Practice Providers (APPs -  Physician Assistants and Nurse Practitioners) who all work together to provide you with the care you need, when you need it.  We recommend signing up for the patient portal called "MyChart".  Sign up information is provided on this After Visit Summary.  MyChart is used to connect with patients for Virtual Visits (Telemedicine).  Patients are able to view lab/test results, encounter notes, upcoming appointments, etc.  Non-urgent messages can be sent to your provider as well.   To learn more about what you can do with MyChart, go to [email or web address].    Your next appointment:   Follow up as needed

## 2020-07-12 NOTE — Progress Notes (Signed)
ekg E

## 2020-07-30 DIAGNOSIS — F329 Major depressive disorder, single episode, unspecified: Secondary | ICD-10-CM | POA: Diagnosis not present

## 2020-07-30 DIAGNOSIS — R7301 Impaired fasting glucose: Secondary | ICD-10-CM | POA: Diagnosis not present

## 2020-07-30 DIAGNOSIS — B370 Candidal stomatitis: Secondary | ICD-10-CM | POA: Diagnosis not present

## 2020-07-30 DIAGNOSIS — K219 Gastro-esophageal reflux disease without esophagitis: Secondary | ICD-10-CM | POA: Diagnosis not present

## 2020-07-30 DIAGNOSIS — J3489 Other specified disorders of nose and nasal sinuses: Secondary | ICD-10-CM | POA: Diagnosis not present

## 2020-08-08 DIAGNOSIS — Z85828 Personal history of other malignant neoplasm of skin: Secondary | ICD-10-CM | POA: Diagnosis not present

## 2020-08-08 DIAGNOSIS — D1801 Hemangioma of skin and subcutaneous tissue: Secondary | ICD-10-CM | POA: Diagnosis not present

## 2020-08-08 DIAGNOSIS — L570 Actinic keratosis: Secondary | ICD-10-CM | POA: Diagnosis not present

## 2020-08-08 DIAGNOSIS — L821 Other seborrheic keratosis: Secondary | ICD-10-CM | POA: Diagnosis not present

## 2020-08-21 DIAGNOSIS — B370 Candidal stomatitis: Secondary | ICD-10-CM | POA: Diagnosis not present

## 2020-08-21 DIAGNOSIS — J3481 Nasal mucositis (ulcerative): Secondary | ICD-10-CM | POA: Diagnosis not present

## 2020-09-03 DIAGNOSIS — K149 Disease of tongue, unspecified: Secondary | ICD-10-CM | POA: Diagnosis not present

## 2020-09-03 DIAGNOSIS — J00 Acute nasopharyngitis [common cold]: Secondary | ICD-10-CM | POA: Diagnosis not present

## 2020-09-19 DIAGNOSIS — L820 Inflamed seborrheic keratosis: Secondary | ICD-10-CM | POA: Diagnosis not present

## 2020-09-19 DIAGNOSIS — Z85828 Personal history of other malignant neoplasm of skin: Secondary | ICD-10-CM | POA: Diagnosis not present

## 2020-09-19 DIAGNOSIS — L309 Dermatitis, unspecified: Secondary | ICD-10-CM | POA: Diagnosis not present

## 2020-09-19 DIAGNOSIS — L821 Other seborrheic keratosis: Secondary | ICD-10-CM | POA: Diagnosis not present

## 2020-10-08 DIAGNOSIS — H0100B Unspecified blepharitis left eye, upper and lower eyelids: Secondary | ICD-10-CM | POA: Diagnosis not present

## 2020-10-08 DIAGNOSIS — H02831 Dermatochalasis of right upper eyelid: Secondary | ICD-10-CM | POA: Diagnosis not present

## 2020-10-08 DIAGNOSIS — H02834 Dermatochalasis of left upper eyelid: Secondary | ICD-10-CM | POA: Diagnosis not present

## 2020-10-19 ENCOUNTER — Encounter (HOSPITAL_COMMUNITY): Payer: Self-pay | Admitting: Emergency Medicine

## 2020-10-19 ENCOUNTER — Other Ambulatory Visit: Payer: Self-pay

## 2020-10-19 ENCOUNTER — Emergency Department (HOSPITAL_COMMUNITY)
Admission: EM | Admit: 2020-10-19 | Discharge: 2020-10-20 | Disposition: A | Discharge status: Home / Self Care | Payer: Medicare Other | Source: Home / Self Care | Attending: Emergency Medicine | Admitting: Emergency Medicine

## 2020-10-19 ENCOUNTER — Emergency Department (HOSPITAL_COMMUNITY): Payer: Medicare Other

## 2020-10-19 DIAGNOSIS — K529 Noninfective gastroenteritis and colitis, unspecified: Secondary | ICD-10-CM | POA: Diagnosis not present

## 2020-10-19 DIAGNOSIS — Z20822 Contact with and (suspected) exposure to covid-19: Secondary | ICD-10-CM | POA: Diagnosis not present

## 2020-10-19 DIAGNOSIS — R197 Diarrhea, unspecified: Secondary | ICD-10-CM | POA: Insufficient documentation

## 2020-10-19 DIAGNOSIS — R1110 Vomiting, unspecified: Secondary | ICD-10-CM | POA: Diagnosis not present

## 2020-10-19 DIAGNOSIS — R112 Nausea with vomiting, unspecified: Secondary | ICD-10-CM | POA: Insufficient documentation

## 2020-10-19 DIAGNOSIS — B029 Zoster without complications: Secondary | ICD-10-CM | POA: Diagnosis not present

## 2020-10-19 DIAGNOSIS — R531 Weakness: Secondary | ICD-10-CM | POA: Diagnosis not present

## 2020-10-19 DIAGNOSIS — N390 Urinary tract infection, site not specified: Secondary | ICD-10-CM | POA: Diagnosis not present

## 2020-10-19 DIAGNOSIS — Z1611 Resistance to penicillins: Secondary | ICD-10-CM | POA: Diagnosis not present

## 2020-10-19 DIAGNOSIS — E871 Hypo-osmolality and hyponatremia: Secondary | ICD-10-CM | POA: Diagnosis not present

## 2020-10-19 DIAGNOSIS — B9620 Unspecified Escherichia coli [E. coli] as the cause of diseases classified elsewhere: Secondary | ICD-10-CM | POA: Diagnosis not present

## 2020-10-19 DIAGNOSIS — R9431 Abnormal electrocardiogram [ECG] [EKG]: Secondary | ICD-10-CM | POA: Diagnosis not present

## 2020-10-19 LAB — COMPREHENSIVE METABOLIC PANEL
ALT: 22 U/L (ref 0–44)
AST: 26 U/L (ref 15–41)
Albumin: 4.6 g/dL (ref 3.5–5.0)
Alkaline Phosphatase: 51 U/L (ref 38–126)
Anion gap: 15 (ref 5–15)
BUN: 13 mg/dL (ref 8–23)
CO2: 22 mmol/L (ref 22–32)
Calcium: 9.1 mg/dL (ref 8.9–10.3)
Chloride: 89 mmol/L — ABNORMAL LOW (ref 98–111)
Creatinine, Ser: 0.48 mg/dL (ref 0.44–1.00)
GFR, Estimated: >60 mL/min (ref >60)
Glucose, Bld: 135 mg/dL — ABNORMAL HIGH (ref 70–99)
Potassium: 3.9 mmol/L (ref 3.5–5.1)
Sodium: 126 mmol/L — ABNORMAL LOW (ref 135–145)
Total Bilirubin: 1.3 mg/dL — ABNORMAL HIGH (ref 0.3–1.2)
Total Protein: 6.9 g/dL (ref 6.5–8.1)

## 2020-10-19 LAB — LIPASE, BLOOD: Lipase: 25 U/L (ref 11–51)

## 2020-10-19 LAB — TROPONIN I (HIGH SENSITIVITY)
Troponin I (High Sensitivity): 6 ng/L (ref <18)
Troponin I (High Sensitivity): 7 ng/L (ref <18)

## 2020-10-19 MED ORDER — ONDANSETRON HCL 4 MG/2ML IJ SOLN
4.0000 mg | Freq: Once | INTRAMUSCULAR | Status: AC
  Administered 2020-10-19: 4 mg via INTRAVENOUS
  Filled 2020-10-19: qty 2

## 2020-10-19 MED ORDER — SODIUM CHLORIDE 0.9 % IV BOLUS
500.0000 mL | Freq: Once | INTRAVENOUS | Status: AC
  Administered 2020-10-19: 500 mL via INTRAVENOUS

## 2020-10-19 MED ORDER — METOCLOPRAMIDE HCL 5 MG/ML IJ SOLN
10.0000 mg | Freq: Once | INTRAMUSCULAR | Status: AC
  Administered 2020-10-20: 10 mg via INTRAVENOUS
  Filled 2020-10-19: qty 2

## 2020-10-19 MED ORDER — IOHEXOL 300 MG/ML  SOLN
100.0000 mL | Freq: Once | INTRAMUSCULAR | Status: AC | PRN
  Administered 2020-10-19: 100 mL via INTRAVENOUS

## 2020-10-19 MED ORDER — FAMOTIDINE IN NACL 20-0.9 MG/50ML-% IV SOLN
20.0000 mg | Freq: Once | INTRAVENOUS | Status: AC
  Administered 2020-10-20: 20 mg via INTRAVENOUS
  Filled 2020-10-19: qty 50

## 2020-10-19 NOTE — ED Provider Notes (Signed)
[facility] DEPT Provider Note   CSN: [id number] Arrival date & time: 10/19/20  1923     History Chief Complaint  Patient presents with  . Emesis    Brenda [name] is a 87 y.o. female.  The history is provided by the patient, medical records and a relative.   Brenda [name] is a 87 y.o. female who presents to the Emergency Department complaining of vomiting and diarrhea.  She presents to the ED accompanied by her husband for evaluation of severe N/V/D that started last night.  Last meal was at [facility] yesterday for lunch - had a spinach and cheese quesadilla.  She has associated loose stools.  No fever, chest pain, abdominal pain, HA, dysuria.  No prior similar sxs.  Unknown if there was possible bad food exposure.      Past Medical History:  Diagnosis Date  . Anxiety   . Arthritis   . CLL (chronic lymphocytic leukemia) ([place])   . Complication of anesthesia   . Depression   . Depression with anxiety   . Diverticulosis   . Dyspnea   . GERD (gastroesophageal reflux disease)   . Hearing loss   . Memory loss   . Mild cognitive impairment   . Osteopenia   . PONV (postoperative nausea and vomiting)   . Reflux   . SOB (shortness of breath)     Patient Active Problem List   Diagnosis Date Noted  . Abnormal echocardiogram 06/15/2019  . Dyspnea 07/29/2016  . Left shoulder pain 07/29/2016  . Generalized anxiety disorder 09/16/2015  . Memory loss 06/02/2013    Past Surgical History:  Procedure Laterality Date  . ABDOMINAL HYSTERECTOMY    . APPENDECTOMY    . BREAST EXCISIONAL BIOPSY Left   . CARDIAC EVENT MONITOR  02/03/2005   30 days, normal event monitor  . CATARACT EXTRACTION Bilateral   . EYE SURGERY     squamous cell carcinoma surgery in right eye lid.  Marland Kitchen KNEE ARTHROSCOPY Right 07/02/2016   Procedure: ARTHROSCOPY KNEE WITH DEBRIDEMENT, PARTIAL LATERAL AND MEDIAL MENISCECTOMY, with drainage of cyst;  Surgeon: Susa Day, MD;   Location: [facility];  Service: Orthopedics;  Laterality: Right;  . NM GATED MYOCARDIAL STUDY (ARMX HX)  01/23/2011   Normal pattern of perfusion in all regions. post-stress EF is 85%. EKG negative for ischemia. no ECG changes.  . TONSILLECTOMY    . TRANSTHORACIC ECHOCARDIOGRAM  10/31/2010   EF >55%, there is concern for inferoacpical infarct with possible inferoapical aneurysm or pseudoaneurysm - could be echo artificat     OB History   No obstetric history on file.     Family History  Problem Relation Age of Onset  . Alzheimer's disease Father   . Dementia Father   . Atrial fibrillation Brother   . Dementia Maternal Grandmother   . Dementia Paternal Grandmother     Social History   Tobacco Use  . Smoking status: Never Smoker  . Smokeless tobacco: Never Used  Vaping Use  . Vaping Use: Never used  Substance Use Topics  . Alcohol use: No  . Drug use: No    Home Medications Prior to Admission medications   Medication Sig Start Date End Date Taking? Authorizing Provider  acetaminophen (TYLENOL) 500 MG tablet Take 500 mg by mouth every 6 (six) hours as needed for moderate pain.   Yes [provider]  atorvastatin (LIPITOR) 20 MG tablet Take 20 mg by mouth daily.   Yes [provider]  cycloSPORINE (RESTASIS) 0.05 % ophthalmic emulsion Place 1 drop into both eyes 2 (two) times daily.   Yes [provider]  desvenlafaxine (PRISTIQ) 50 MG 24 hr tablet Take 50 mg by mouth daily.   Yes [provider]  estradiol (ESTRING) 2 MG vaginal ring Place 2 mg vaginally every 3 (three) months. follow package directions   Yes [provider]  LORazepam (ATIVAN) 0.5 MG tablet Take 0.5 mg by mouth at bedtime.    Yes [provider]  Multiple Vitamin (MULTIVITAMIN) capsule Take 1 capsule by mouth daily.   Yes [provider]  Multiple Vitamins-Minerals (PRESERVISION AREDS 2+MULTI VIT PO) Take 1 capsule by mouth daily.   Yes [provider]  MYRBETRIQ 50 MG TB24 tablet Take 50 mg by mouth daily. 06/02/19  Yes [provider]  ondansetron (ZOFRAN) 8 MG tablet Take 8 mg by mouth every 8 (eight) hours as needed for nausea or vomiting.   Yes [provider]  pantoprazole (PROTONIX) 40 MG tablet Take 40 mg by mouth daily.   Yes [provider]  solifenacin (VESICARE) 5 MG tablet Take 5 mg by mouth daily. 05/16/19  Yes [provider]  ZETIA 10 MG tablet Take 10 mg by mouth at bedtime.  05/19/13  Yes [provider]  zolpidem (AMBIEN) 5 MG tablet Take 5 mg by mouth at bedtime.    Yes [provider]    Allergies    Azithromycin, Adhesive [tape], Codeine, Doxycycline, and Sulfa antibiotics  Review of Systems   Review of Systems  All other systems reviewed and are negative.   Physical Exam Updated Vital Signs BP 116/81   Pulse 84   Temp 98.9 F (37.2 C)   Resp 17   SpO2 98%   Physical Exam Vitals and nursing note reviewed.  Constitutional:      Appearance: She is well-developed and well-nourished.  HENT:     Head: Normocephalic and atraumatic.  Cardiovascular:     Rate and Rhythm: Normal rate and regular rhythm.  Pulmonary:     Effort: Pulmonary effort is normal. No respiratory distress.  Abdominal:     Palpations: Abdomen is soft.     Tenderness: There is no abdominal tenderness. There is no guarding or rebound.  Musculoskeletal:        General: No swelling, tenderness or edema.  Skin:    General: Skin is warm and dry.  Neurological:     Mental Status: She is alert and oriented to person, place, and time.  Psychiatric:        Mood and Affect: Mood and affect normal.        Behavior: Behavior normal.     ED Results / Procedures / Treatments   Labs (all labs ordered are listed, but only abnormal results are displayed) Labs Reviewed  COMPREHENSIVE METABOLIC PANEL - Abnormal; Notable for the following components:      Result Value   Sodium  126 (*)    Chloride 89 (*)    Glucose, Bld 135 (*)    Total Bilirubin 1.3 (*)    All other components within normal limits  URINE CULTURE  LIPASE, BLOOD  URINALYSIS, ROUTINE W REFLEX MICROSCOPIC  TROPONIN I (HIGH SENSITIVITY)  TROPONIN I (HIGH SENSITIVITY)    EKG None  Radiology No results found.  Procedures Procedures   Medications Ordered in ED Medications  famotidine (PEPCID) IVPB 20 mg premix (has no administration in time range)  metoCLOPramide (REGLAN)  injection 10 mg (has no administration in time range)  iohexol (OMNIPAQUE) 300 MG/ML solution 100 mL (has no administration in time range)  sodium chloride 0.9 % bolus 500 mL (500 mLs Intravenous New Bag/Given 10/19/20 2126)  ondansetron (ZOFRAN) injection 4 mg (4 mg Intravenous Given 10/19/20 2125)    ED Course  I have reviewed the triage vital signs and the nursing notes.  Pertinent labs & imaging results that were available during my care of the patient were reviewed by me and considered in my medical decision making (see chart for details).    MDM Rules/Calculators/A&P                         patient here for evaluation of nausea, vomiting, diarrhea that started yesterday. Patient with perfused emesis on ED evaluation. She has no reports of abdominal pain, no significant tenderness on examination. Labs with mild hyponatremia. On reassessment she has persistent nausea but her emesis has stopped. Will provide additional antiemetic. Patient care transferred pending a CT abdomen pelvis and urinalysis.  Final Clinical Impression(s) / ED Diagnoses Final diagnoses:  None    Rx / DC Orders ED Discharge Orders    None       Quintella Reichert, MD 10/19/20 2303

## 2020-10-19 NOTE — ED Triage Notes (Signed)
Patient reports N/V/D today. Denies pain.

## 2020-10-20 LAB — URINALYSIS, ROUTINE W REFLEX MICROSCOPIC
Bacteria, UA: NONE SEEN
Bilirubin Urine: NEGATIVE
Glucose, UA: 50 mg/dL — AB
Hgb urine dipstick: NEGATIVE
Ketones, ur: 80 mg/dL — AB
Leukocytes,Ua: NEGATIVE
Nitrite: NEGATIVE
Protein, ur: 100 mg/dL — AB
Specific Gravity, Urine: 1.038 — ABNORMAL HIGH (ref 1.005–1.030)
pH: 6 (ref 5.0–8.0)

## 2020-10-20 MED ORDER — METOCLOPRAMIDE HCL 10 MG PO TABS: 10.0000 mg | ORAL_TABLET | Freq: Four times a day (QID) | ORAL | 0 refills | Status: DC | PRN

## 2020-10-20 NOTE — ED Provider Notes (Signed)
Nursing notes and vitals signs, including pulse oximetry, reviewed.  Summary of this visit's results, reviewed by myself:  EKG:  EKG Interpretation  Date/Time:  Saturday October 19 2020 19:49:34 EST Ventricular Rate:  82 PR Interval:    QRS Duration: 85 QT Interval:  402 QTC Calculation: 470 R Axis:   2 Text Interpretation: Sinus rhythm Confirmed by Quintella Reichert [phone]) on 10/19/2020 11:13:53 PM       Labs:  Results for orders placed or performed during the hospital encounter of 10/19/20 (from the past 24 hour(s))  Comprehensive metabolic panel     Status: Abnormal   Collection Time: 10/19/20  9:35 PM  Result Value Ref Range   Sodium 126 (L) 135 - 145 mmol/L   Potassium 3.9 3.5 - 5.1 mmol/L   Chloride 89 (L) 98 - 111 mmol/L   CO2 22 22 - 32 mmol/L   Glucose, Bld 135 (H) 70 - 99 mg/dL   BUN 13 8 - 23 mg/dL   Creatinine, Ser 0.48 0.44 - 1.00 mg/dL   Calcium 9.1 8.9 - 10.3 mg/dL   Total Protein 6.9 6.5 - 8.1 g/dL   Albumin 4.6 3.5 - 5.0 g/dL   AST 26 15 - 41 U/L   ALT 22 0 - 44 U/L   Alkaline Phosphatase 51 38 - 126 U/L   Total Bilirubin 1.3 (H) 0.3 - 1.2 mg/dL   GFR, Estimated >60 >60 mL/min   Anion gap 15 5 - 15  Lipase, blood     Status: None   Collection Time: 10/19/20  9:35 PM  Result Value Ref Range   Lipase 25 11 - 51 U/L  Troponin I (High Sensitivity)     Status: None   Collection Time: 10/19/20  9:35 PM  Result Value Ref Range   Troponin I (High Sensitivity) 7 <18 ng/L  Troponin I (High Sensitivity)     Status: None   Collection Time: 10/19/20  9:35 PM  Result Value Ref Range   Troponin I (High Sensitivity) 6 <18 ng/L    Imaging Studies: CT Abdomen Pelvis W Contrast  Result Date: 10/19/2020 CLINICAL DATA:  Nausea vomiting and diarrhea. Bowel obstruction suspected. EXAM: CT ABDOMEN AND PELVIS WITH CONTRAST TECHNIQUE: Multidetector CT imaging of the abdomen and pelvis was performed using the standard protocol following bolus administration of  intravenous contrast. CONTRAST:  11mL OMNIPAQUE IOHEXOL 300 MG/ML  SOLN COMPARISON:  04/18/2019 FINDINGS: Lower chest: Mild atelectasis at both lung bases right more than left. This could be simple atelectasis or could possibly relate to aspiration. There is a large hiatal hernia. Hepatobiliary: Liver parenchyma is normal. No calcified gallstones. No biliary obstruction. Pancreas: Normal Spleen: Normal Adrenals/Urinary Tract: Adrenal glands are normal. Kidneys are normal. There are numerous parapelvic cysts but no sign of actual renal obstruction. The bladder is normal, with exception that there is a tiny bit of air. Was the patient catheterized? Stomach/Bowel: Small intestine is normal. The cecum is reflected medially. The appendix is not specifically seen but there is no sign of appendicitis. No diverticulosis or diverticulitis. No sign of colon obstruction. Vascular/Lymphatic: Aortic atherosclerosis. No aneurysm. IVC is normal. No retroperitoneal adenopathy. Numerous phleboliths in dilated ovarian veins on the left. Reproductive: Pessary in place. Previous hysterectomy. No pelvic mass. Other: No free fluid or air. Musculoskeletal: Chronic lumbar degenerative changes without acute finding. IMPRESSION: 1. No acute finding. No evidence of bowel obstruction or other acute bowel pathology. Patient does have a hiatal hernia. 2. Mild atelectasis at both lung  bases right more than left. This could be simple atelectasis or could possibly relate to aspiration. 3. Aortic atherosclerosis. Aortic Atherosclerosis (ICD10-I70.0). Electronically Signed   By: Nelson Chimes M.D.   On: 10/19/2020 23:40   2:37 AM Patient feeling much better after IV Reglan.  She has been able to drink fluids without emesis.  She does not believe that she choked or aspirated on any vomit and so I think the CT scan findings were consistent with atelectasis.  Her husband states she walks in a "stooped over" manner and this may contributed to this  atelectasis.  The patient states she is comfortable going home at this time.     Arlo Buffone, Jenny Reichmann, MD 10/20/20 [phone]

## 2020-10-21 ENCOUNTER — Encounter (HOSPITAL_COMMUNITY): Payer: Self-pay | Admitting: Internal Medicine

## 2020-10-21 ENCOUNTER — Other Ambulatory Visit: Payer: Self-pay

## 2020-10-21 ENCOUNTER — Inpatient Hospital Stay (HOSPITAL_COMMUNITY)
Admission: EM | Admit: 2020-10-21 | Discharge: 2020-10-23 | DRG: 641 | Disposition: A | Discharge status: Home / Self Care | Payer: Medicare Other | Attending: Internal Medicine | Admitting: Internal Medicine

## 2020-10-21 DIAGNOSIS — Z9841 Cataract extraction status, right eye: Secondary | ICD-10-CM

## 2020-10-21 DIAGNOSIS — E871 Hypo-osmolality and hyponatremia: Principal | ICD-10-CM | POA: Diagnosis present

## 2020-10-21 DIAGNOSIS — F32A Depression, unspecified: Secondary | ICD-10-CM | POA: Diagnosis present

## 2020-10-21 DIAGNOSIS — N390 Urinary tract infection, site not specified: Secondary | ICD-10-CM | POA: Diagnosis present

## 2020-10-21 DIAGNOSIS — M545 Low back pain, unspecified: Secondary | ICD-10-CM | POA: Diagnosis present

## 2020-10-21 DIAGNOSIS — Z79899 Other long term (current) drug therapy: Secondary | ICD-10-CM

## 2020-10-21 DIAGNOSIS — E785 Hyperlipidemia, unspecified: Secondary | ICD-10-CM | POA: Diagnosis present

## 2020-10-21 DIAGNOSIS — F419 Anxiety disorder, unspecified: Secondary | ICD-10-CM | POA: Diagnosis present

## 2020-10-21 DIAGNOSIS — G8929 Other chronic pain: Secondary | ICD-10-CM | POA: Diagnosis present

## 2020-10-21 DIAGNOSIS — B029 Zoster without complications: Secondary | ICD-10-CM | POA: Diagnosis not present

## 2020-10-21 DIAGNOSIS — E861 Hypovolemia: Secondary | ICD-10-CM | POA: Diagnosis present

## 2020-10-21 DIAGNOSIS — R531 Weakness: Secondary | ICD-10-CM | POA: Diagnosis not present

## 2020-10-21 DIAGNOSIS — Z20822 Contact with and (suspected) exposure to covid-19: Secondary | ICD-10-CM | POA: Diagnosis present

## 2020-10-21 DIAGNOSIS — E876 Hypokalemia: Secondary | ICD-10-CM | POA: Diagnosis present

## 2020-10-21 DIAGNOSIS — K219 Gastro-esophageal reflux disease without esophagitis: Secondary | ICD-10-CM | POA: Diagnosis present

## 2020-10-21 DIAGNOSIS — Z1611 Resistance to penicillins: Secondary | ICD-10-CM | POA: Diagnosis present

## 2020-10-21 DIAGNOSIS — G3184 Mild cognitive impairment, so stated: Secondary | ICD-10-CM | POA: Diagnosis present

## 2020-10-21 DIAGNOSIS — B0229 Other postherpetic nervous system involvement: Principal | ICD-10-CM

## 2020-10-21 DIAGNOSIS — Z856 Personal history of leukemia: Secondary | ICD-10-CM

## 2020-10-21 DIAGNOSIS — F418 Other specified anxiety disorders: Secondary | ICD-10-CM | POA: Diagnosis present

## 2020-10-21 DIAGNOSIS — Z885 Allergy status to narcotic agent status: Secondary | ICD-10-CM

## 2020-10-21 DIAGNOSIS — Z882 Allergy status to sulfonamides status: Secondary | ICD-10-CM

## 2020-10-21 DIAGNOSIS — Z9842 Cataract extraction status, left eye: Secondary | ICD-10-CM

## 2020-10-21 DIAGNOSIS — Z888 Allergy status to other drugs, medicaments and biological substances status: Secondary | ICD-10-CM

## 2020-10-21 DIAGNOSIS — H9190 Unspecified hearing loss, unspecified ear: Secondary | ICD-10-CM | POA: Diagnosis present

## 2020-10-21 DIAGNOSIS — Z820 Family history of epilepsy and other diseases of the nervous system: Secondary | ICD-10-CM

## 2020-10-21 DIAGNOSIS — B9620 Unspecified Escherichia coli [E. coli] as the cause of diseases classified elsewhere: Secondary | ICD-10-CM | POA: Diagnosis present

## 2020-10-21 LAB — URINALYSIS, ROUTINE W REFLEX MICROSCOPIC
Bilirubin Urine: NEGATIVE
Glucose, UA: NEGATIVE mg/dL
Ketones, ur: 20 mg/dL — AB
Nitrite: NEGATIVE
Protein, ur: 30 mg/dL — AB
Specific Gravity, Urine: 1.015 (ref 1.005–1.030)
WBC, UA: >50 WBC/hpf — ABNORMAL HIGH (ref 0–5)
pH: 6 (ref 5.0–8.0)

## 2020-10-21 LAB — CBC WITH DIFFERENTIAL/PLATELET
Abs Immature Granulocytes: 0.04 10*3/uL (ref 0.00–0.07)
Basophils Absolute: 0.1 10*3/uL (ref 0.0–0.1)
Basophils Relative: 1 %
Eosinophils Absolute: 0 10*3/uL (ref 0.0–0.5)
Eosinophils Relative: 0 %
HCT: 42.2 % (ref 36.0–46.0)
Hemoglobin: 14.9 g/dL (ref 12.0–15.0)
Immature Granulocytes: 0 %
Lymphocytes Relative: 39 %
Lymphs Abs: 5.7 10*3/uL — ABNORMAL HIGH (ref 0.7–4.0)
MCH: 30.5 pg (ref 26.0–34.0)
MCHC: 35.3 g/dL (ref 30.0–36.0)
MCV: 86.3 fL (ref 80.0–100.0)
Monocytes Absolute: 1.3 10*3/uL — ABNORMAL HIGH (ref 0.1–1.0)
Monocytes Relative: 9 %
Neutro Abs: 7.7 10*3/uL (ref 1.7–7.7)
Neutrophils Relative %: 51 %
Platelets: 248 10*3/uL (ref 150–400)
RBC: 4.89 MIL/uL (ref 3.87–5.11)
RDW: 12.8 % (ref 11.5–15.5)
WBC: 14.8 10*3/uL — ABNORMAL HIGH (ref 4.0–10.5)
nRBC: 0 % (ref 0.0–0.2)

## 2020-10-21 LAB — RENAL FUNCTION PANEL
Albumin: 3.9 g/dL (ref 3.5–5.0)
Anion gap: 7 (ref 5–15)
BUN: 12 mg/dL (ref 8–23)
CO2: 25 mmol/L (ref 22–32)
Calcium: 7.8 mg/dL — ABNORMAL LOW (ref 8.9–10.3)
Chloride: 89 mmol/L — ABNORMAL LOW (ref 98–111)
Creatinine, Ser: 0.52 mg/dL (ref 0.44–1.00)
GFR, Estimated: >60 mL/min (ref >60)
Glucose, Bld: 144 mg/dL — ABNORMAL HIGH (ref 70–99)
Phosphorus: 1.8 mg/dL — ABNORMAL LOW (ref 2.5–4.6)
Potassium: 3.3 mmol/L — ABNORMAL LOW (ref 3.5–5.1)
Sodium: 121 mmol/L — ABNORMAL LOW (ref 135–145)

## 2020-10-21 LAB — COMPREHENSIVE METABOLIC PANEL
ALT: 28 U/L (ref 0–44)
AST: 48 U/L — ABNORMAL HIGH (ref 15–41)
Albumin: 4.5 g/dL (ref 3.5–5.0)
Alkaline Phosphatase: 45 U/L (ref 38–126)
Anion gap: 9 (ref 5–15)
BUN: 14 mg/dL (ref 8–23)
CO2: 25 mmol/L (ref 22–32)
Calcium: 8.7 mg/dL — ABNORMAL LOW (ref 8.9–10.3)
Chloride: 88 mmol/L — ABNORMAL LOW (ref 98–111)
Creatinine, Ser: 0.56 mg/dL (ref 0.44–1.00)
GFR, Estimated: >60 mL/min (ref >60)
Glucose, Bld: 140 mg/dL — ABNORMAL HIGH (ref 70–99)
Potassium: 3.4 mmol/L — ABNORMAL LOW (ref 3.5–5.1)
Sodium: 122 mmol/L — ABNORMAL LOW (ref 135–145)
Total Bilirubin: 1.4 mg/dL — ABNORMAL HIGH (ref 0.3–1.2)
Total Protein: 6.4 g/dL — ABNORMAL LOW (ref 6.5–8.1)

## 2020-10-21 LAB — SODIUM, URINE, RANDOM: Sodium, Ur: 53 mmol/L

## 2020-10-21 LAB — OSMOLALITY, URINE: Osmolality, Ur: 529 mOsm/kg (ref 300–900)

## 2020-10-21 LAB — LACTIC ACID, PLASMA: Lactic Acid, Venous: 1.2 mmol/L (ref 0.5–1.9)

## 2020-10-21 LAB — RESP PANEL BY RT-PCR (FLU A&B, COVID) ARPGX2
Influenza A by PCR: NEGATIVE
Influenza B by PCR: NEGATIVE
SARS Coronavirus 2 by RT PCR: NEGATIVE

## 2020-10-21 LAB — PHOSPHORUS: Phosphorus: 1.6 mg/dL — ABNORMAL LOW (ref 2.5–4.6)

## 2020-10-21 LAB — LIPASE, BLOOD: Lipase: 27 U/L (ref 11–51)

## 2020-10-21 LAB — MAGNESIUM: Magnesium: 1.9 mg/dL (ref 1.7–2.4)

## 2020-10-21 MED ORDER — SODIUM CHLORIDE 0.9 % IV SOLN: INTRAVENOUS | Status: DC

## 2020-10-21 MED ORDER — PANTOPRAZOLE SODIUM 40 MG IV SOLR
40.0000 mg | Freq: Every day | INTRAVENOUS | Status: DC
  Administered 2020-10-21 – 2020-10-23 (×3): 40 mg via INTRAVENOUS
  Filled 2020-10-21 (×3): qty 40

## 2020-10-21 MED ORDER — MIRABEGRON ER 25 MG PO TB24
50.0000 mg | ORAL_TABLET | Freq: Every day | ORAL | Status: DC
  Administered 2020-10-21 – 2020-10-23 (×3): 50 mg via ORAL
  Filled 2020-10-21 (×3): qty 2

## 2020-10-21 MED ORDER — LACTATED RINGERS IV SOLN: INTRAVENOUS | Status: DC

## 2020-10-21 MED ORDER — POTASSIUM PHOSPHATES 15 MMOLE/5ML IV SOLN
30.0000 mmol | Freq: Once | INTRAVENOUS | Status: AC
  Administered 2020-10-21: 30 mmol via INTRAVENOUS
  Filled 2020-10-21 (×2): qty 10

## 2020-10-21 MED ORDER — ATORVASTATIN CALCIUM 20 MG PO TABS
20.0000 mg | ORAL_TABLET | Freq: Every day | ORAL | Status: DC
  Administered 2020-10-21 – 2020-10-23 (×3): 20 mg via ORAL
  Filled 2020-10-21: qty 1
  Filled 2020-10-21: qty 2
  Filled 2020-10-21: qty 1

## 2020-10-21 MED ORDER — PROCHLORPERAZINE EDISYLATE 10 MG/2ML IJ SOLN
10.0000 mg | Freq: Four times a day (QID) | INTRAMUSCULAR | Status: DC | PRN
  Administered 2020-10-21: 10 mg via INTRAVENOUS
  Filled 2020-10-21: qty 2

## 2020-10-21 MED ORDER — LORAZEPAM 0.5 MG PO TABS
0.5000 mg | ORAL_TABLET | Freq: Every day | ORAL | Status: DC
  Administered 2020-10-21 – 2020-10-22 (×2): 0.5 mg via ORAL
  Filled 2020-10-21 (×2): qty 1

## 2020-10-21 MED ORDER — SODIUM CHLORIDE 0.9 % IV SOLN
1.0000 g | INTRAVENOUS | Status: DC
  Administered 2020-10-21 – 2020-10-22 (×2): 1 g via INTRAVENOUS
  Filled 2020-10-21: qty 10
  Filled 2020-10-21: qty 1
  Filled 2020-10-21: qty 10

## 2020-10-21 MED ORDER — DEXTROSE 5 % IV SOLN
700.0000 mg | Freq: Three times a day (TID) | INTRAVENOUS | Status: DC
  Administered 2020-10-21 – 2020-10-22 (×3): 700 mg via INTRAVENOUS
  Filled 2020-10-21 (×4): qty 14

## 2020-10-21 MED ORDER — HYDROMORPHONE HCL 1 MG/ML IJ SOLN
0.5000 mg | Freq: Once | INTRAMUSCULAR | Status: AC
  Administered 2020-10-21: 0.5 mg via INTRAVENOUS
  Filled 2020-10-21: qty 1

## 2020-10-21 MED ORDER — DARIFENACIN HYDROBROMIDE ER 7.5 MG PO TB24
7.5000 mg | ORAL_TABLET | Freq: Every day | ORAL | Status: DC
  Administered 2020-10-22 – 2020-10-23 (×2): 7.5 mg via ORAL
  Filled 2020-10-21 (×2): qty 1

## 2020-10-21 MED ORDER — ZOLPIDEM TARTRATE 5 MG PO TABS
5.0000 mg | ORAL_TABLET | Freq: Every day | ORAL | Status: DC
  Administered 2020-10-21 – 2020-10-22 (×2): 5 mg via ORAL
  Filled 2020-10-21 (×2): qty 1

## 2020-10-21 MED ORDER — CYCLOSPORINE 0.05 % OP EMUL
1.0000 [drp] | Freq: Two times a day (BID) | OPHTHALMIC | Status: DC
  Administered 2020-10-21 – 2020-10-23 (×5): 1 [drp] via OPHTHALMIC
  Filled 2020-10-21 (×6): qty 1

## 2020-10-21 MED ORDER — POTASSIUM CHLORIDE 10 MEQ/100ML IV SOLN
10.0000 meq | Freq: Once | INTRAVENOUS | Status: AC
  Administered 2020-10-21: 10 meq via INTRAVENOUS
  Filled 2020-10-21: qty 100

## 2020-10-21 MED ORDER — VENLAFAXINE HCL ER 75 MG PO CP24
75.0000 mg | ORAL_CAPSULE | Freq: Every day | ORAL | Status: DC
  Administered 2020-10-22 – 2020-10-23 (×2): 75 mg via ORAL
  Filled 2020-10-21 (×2): qty 1

## 2020-10-21 MED ORDER — GABAPENTIN 100 MG PO CAPS
200.0000 mg | ORAL_CAPSULE | Freq: Three times a day (TID) | ORAL | Status: DC
  Administered 2020-10-21 – 2020-10-23 (×7): 200 mg via ORAL
  Filled 2020-10-21 (×7): qty 2

## 2020-10-21 MED ORDER — ACETAMINOPHEN 325 MG PO TABS
650.0000 mg | ORAL_TABLET | Freq: Four times a day (QID) | ORAL | Status: DC | PRN
  Administered 2020-10-21 – 2020-10-23 (×5): 650 mg via ORAL
  Filled 2020-10-21 (×5): qty 2

## 2020-10-21 MED ORDER — VALACYCLOVIR HCL 500 MG PO TABS
1000.0000 mg | ORAL_TABLET | Freq: Once | ORAL | Status: AC
  Administered 2020-10-21: 1000 mg via ORAL
  Filled 2020-10-21: qty 2

## 2020-10-21 MED ORDER — EZETIMIBE 10 MG PO TABS
10.0000 mg | ORAL_TABLET | Freq: Every day | ORAL | Status: DC
  Administered 2020-10-21 – 2020-10-22 (×2): 10 mg via ORAL
  Filled 2020-10-21 (×3): qty 1

## 2020-10-21 MED ORDER — ONDANSETRON HCL 4 MG/2ML IJ SOLN
4.0000 mg | Freq: Once | INTRAMUSCULAR | Status: AC
  Administered 2020-10-21: 4 mg via INTRAVENOUS
  Filled 2020-10-21: qty 2

## 2020-10-21 MED ORDER — ACETAMINOPHEN 650 MG RE SUPP: 650.0000 mg | Freq: Four times a day (QID) | RECTAL | Status: DC | PRN

## 2020-10-21 NOTE — ED Provider Notes (Signed)
[facility] DEPT Provider Note   CSN: [id number] Arrival date & time: 10/21/20  4174     History Chief Complaint  Patient presents with  . Back Pain    Brenda [name] is a 87 y.o. female.  HPI Patient reports she is having fairly severe pain in her right lower back and sacral area.  It is radiating towards the knee.  Reports it seemed of really gotten bad over the past couple of days.  She has been able to walk, the leg is not generally weak.  Patient reports she started to notice a rash on her lower back 2 to 3 days ago, possibly Friday.  She reports she has had her shingles vaccine.  She denies she had cough or shortness of breath.  She reports she has continued to be very nauseated.  She took a Reglan this morning.  Patient was seen in the emergency department the evening of the 19th, she was discharged from the emergency department early morning hours on the 20th.  She reports she did not continue to vomit after she left the emergency department although she did remain quite nauseated and has not had much to eat.  She tolerated a small breakfast yesterday.  Patient denies any focal abdominal pain.  She reports she just generally feels "really lousy".  She denies she has headache, eye pain, neck pain or fever.  He tried some naproxen and Tylenol at home as well the Reglan that she took this morning.  Reports he presents this morning both because of the amount of pain she is having in her lower back as well as the generalized weakness and persistent nausea.  Patient is fully vaccinated for Covid including booster.    Past Medical History:  Diagnosis Date  . Anxiety   . Arthritis   . CLL (chronic lymphocytic leukemia) ([place])   . Complication of anesthesia   . Depression   . Depression with anxiety   . Diverticulosis   . Dyspnea   . GERD (gastroesophageal reflux disease)   . Hearing loss   . Memory loss   . Mild cognitive impairment   . Osteopenia   .  PONV (postoperative nausea and vomiting)   . Reflux   . SOB (shortness of breath)     Patient Active Problem List   Diagnosis Date Noted  . Hyponatremia 10/21/2020  . Abnormal echocardiogram 06/15/2019  . Dyspnea 07/29/2016  . Left shoulder pain 07/29/2016  . Generalized anxiety disorder 09/16/2015  . Memory loss 06/02/2013    Past Surgical History:  Procedure Laterality Date  . ABDOMINAL HYSTERECTOMY    . APPENDECTOMY    . BREAST EXCISIONAL BIOPSY Left   . CARDIAC EVENT MONITOR  02/03/2005   30 days, normal event monitor  . CATARACT EXTRACTION Bilateral   . EYE SURGERY     squamous cell carcinoma surgery in right eye lid.  Marland Kitchen KNEE ARTHROSCOPY Right 07/02/2016   Procedure: ARTHROSCOPY KNEE WITH DEBRIDEMENT, PARTIAL LATERAL AND MEDIAL MENISCECTOMY, with drainage of cyst;  Surgeon: Susa Day, MD;  Location: [facility];  Service: Orthopedics;  Laterality: Right;  . NM GATED MYOCARDIAL STUDY (ARMX HX)  01/23/2011   Normal pattern of perfusion in all regions. post-stress EF is 85%. EKG negative for ischemia. no ECG changes.  . TONSILLECTOMY    . TRANSTHORACIC ECHOCARDIOGRAM  10/31/2010   EF >55%, there is concern for inferoacpical infarct with possible inferoapical aneurysm or pseudoaneurysm - could be echo artificat  OB History   No obstetric history on file.     Family History  Problem Relation Age of Onset  . Alzheimer's disease Father   . Dementia Father   . Atrial fibrillation Brother   . Dementia Maternal Grandmother   . Dementia Paternal Grandmother     Social History   Tobacco Use  . Smoking status: Never Smoker  . Smokeless tobacco: Never Used  Vaping Use  . Vaping Use: Never used  Substance Use Topics  . Alcohol use: No  . Drug use: No    Home Medications Prior to Admission medications   Medication Sig Start Date End Date Taking? Authorizing Provider  acetaminophen (TYLENOL) 500 MG tablet Take 500 mg by mouth every 6 (six) hours as needed for  moderate pain.    [provider]  atorvastatin (LIPITOR) 20 MG tablet Take 20 mg by mouth daily.    [provider]  cycloSPORINE (RESTASIS) 0.05 % ophthalmic emulsion Place 1 drop into both eyes 2 (two) times daily.    [provider]  desvenlafaxine (PRISTIQ) 50 MG 24 hr tablet Take 50 mg by mouth daily.    [provider]  estradiol (ESTRING) 2 MG vaginal ring Place 2 mg vaginally every 3 (three) months. follow package directions    [provider]  LORazepam (ATIVAN) 0.5 MG tablet Take 0.5 mg by mouth at bedtime.     [provider]  metoCLOPramide (REGLAN) 10 MG tablet Take 1 tablet (10 mg total) by mouth every 6 (six) hours as needed for nausea or vomiting. 10/20/20   Molpus, John, MD  Multiple Vitamin (MULTIVITAMIN) capsule Take 1 capsule by mouth daily.    [provider]  Multiple Vitamins-Minerals (PRESERVISION AREDS 2+MULTI VIT PO) Take 1 capsule by mouth daily.    [provider]  MYRBETRIQ 50 MG TB24 tablet Take 50 mg by mouth daily. 06/02/19   [provider]  ondansetron (ZOFRAN) 8 MG tablet Take 8 mg by mouth every 8 (eight) hours as needed for nausea or vomiting.    [provider]  pantoprazole (PROTONIX) 40 MG tablet Take 40 mg by mouth daily.    [provider]  solifenacin (VESICARE) 5 MG tablet Take 5 mg by mouth daily. 05/16/19   [provider]  ZETIA 10 MG tablet Take 10 mg by mouth at bedtime.  05/19/13   [provider]  zolpidem (AMBIEN) 5 MG tablet Take 5 mg by mouth at bedtime.     [provider]    Allergies    Azithromycin, Adhesive [tape], Codeine, Doxycycline, and Sulfa antibiotics  Review of Systems   Review of Systems 10 systems reviewed and negative except as per HPI Physical Exam Updated Vital Signs BP (!) 155/70   Pulse 72   Temp 98.2 F (36.8 C) (Oral)   Resp 18   Ht 5\' 6"  (1.676 m)   Wt 80.3 kg   SpO2 98%   BMI 28.57  kg/m   Physical Exam Constitutional:      Comments: Patient is alert but mildly ill in appearance.  No respiratory distress.    HENT:     Head: Normocephalic and atraumatic.     Mouth/Throat:     Pharynx: Oropharynx is clear.  Eyes:     Extraocular Movements: Extraocular movements intact.  Cardiovascular:     Rate and Rhythm: Normal rate and regular rhythm.  Pulmonary:     Effort: Pulmonary effort is normal.  Breath sounds: Normal breath sounds.  Abdominal:     General: There is no distension.     Palpations: Abdomen is soft.     Tenderness: There is no abdominal tenderness. There is no guarding.  Musculoskeletal:        General: Normal range of motion.  Skin:    General: Skin is warm and dry.     Findings: Rash present.     Comments: Patient has erythematous, patches and hemorrhagic bullae developing on the low back with some rash trailing down the lateral leg consistent with shingles.  See attached image  Neurological:     General: No focal deficit present.     Mental Status: She is oriented to person, place, and time.     Coordination: Coordination normal.  Psychiatric:        Mood and Affect: Mood normal.         ED Results / Procedures / Treatments   Labs (all labs ordered are listed, but only abnormal results are displayed) Labs Reviewed  COMPREHENSIVE METABOLIC PANEL - Abnormal; Notable for the following components:      Result Value   Sodium 122 (*)    Potassium 3.4 (*)    Chloride 88 (*)    Glucose, Bld 140 (*)    Calcium 8.7 (*)    Total Protein 6.4 (*)    AST 48 (*)    Total Bilirubin 1.4 (*)    All other components within normal limits  CBC WITH DIFFERENTIAL/PLATELET - Abnormal; Notable for the following components:   WBC 14.8 (*)    Lymphs Abs 5.7 (*)    Monocytes Absolute 1.3 (*)    All other components within normal limits  PHOSPHORUS - Abnormal; Notable for the following components:   Phosphorus 1.6 (*)    All other components within  normal limits  RESP PANEL BY RT-PCR (FLU A&B, COVID) ARPGX2  LIPASE, BLOOD  LACTIC ACID, PLASMA  MAGNESIUM  URINALYSIS, ROUTINE W REFLEX MICROSCOPIC  RENAL FUNCTION PANEL  RENAL FUNCTION PANEL    EKG EKG Interpretation  Date/Time:  Monday October 21 2020 07:54:13 EST Ventricular Rate:  70 PR Interval:    QRS Duration: 79 QT Interval:  454 QTC Calculation: 490 R Axis:   40 Text Interpretation: Sinus rhythm Borderline prolonged QT interval no sig change from previous Confirmed by Charlesetta Shanks [phone]) on 10/21/2020 9:33:19 AM   Radiology CT Abdomen Pelvis W Contrast  Result Date: 10/19/2020 CLINICAL DATA:  Nausea vomiting and diarrhea. Bowel obstruction suspected. EXAM: CT ABDOMEN AND PELVIS WITH CONTRAST TECHNIQUE: Multidetector CT imaging of the abdomen and pelvis was performed using the standard protocol following bolus administration of intravenous contrast. CONTRAST:  17mL OMNIPAQUE IOHEXOL 300 MG/ML  SOLN COMPARISON:  04/18/2019 FINDINGS: Lower chest: Mild atelectasis at both lung bases right more than left. This could be simple atelectasis or could possibly relate to aspiration. There is a large hiatal hernia. Hepatobiliary: Liver parenchyma is normal. No calcified gallstones. No biliary obstruction. Pancreas: Normal Spleen: Normal Adrenals/Urinary Tract: Adrenal glands are normal. Kidneys are normal. There are numerous parapelvic cysts but no sign of actual renal obstruction. The bladder is normal, with exception that there is a tiny bit of air. Was the patient catheterized? Stomach/Bowel: Small intestine is normal. The cecum is reflected medially. The appendix is not specifically seen but there is no sign of appendicitis. No diverticulosis or diverticulitis. No sign of colon obstruction. Vascular/Lymphatic: Aortic atherosclerosis. No aneurysm. IVC is normal. No retroperitoneal adenopathy.  Numerous phleboliths in dilated ovarian veins on the left. Reproductive: Pessary in place.  Previous hysterectomy. No pelvic mass. Other: No free fluid or air. Musculoskeletal: Chronic lumbar degenerative changes without acute finding. IMPRESSION: 1. No acute finding. No evidence of bowel obstruction or other acute bowel pathology. Patient does have a hiatal hernia. 2. Mild atelectasis at both lung bases right more than left. This could be simple atelectasis or could possibly relate to aspiration. 3. Aortic atherosclerosis. Aortic Atherosclerosis (ICD10-I70.0). Electronically Signed   By: Nelson Chimes M.D.   On: 10/19/2020 23:40    Procedures Procedures   Medications Ordered in ED Medications  potassium chloride 10 mEq in 100 mL IVPB (10 mEq Intravenous New Bag/Given 10/21/20 0952)  0.9 %  sodium chloride infusion ( Intravenous New Bag/Given 10/21/20 0958)  valACYclovir (VALTREX) tablet 1,000 mg (1,000 mg Oral Given 10/21/20 0930)  ondansetron (ZOFRAN) injection 4 mg (4 mg Intravenous Given 10/21/20 0817)  HYDROmorphone (DILAUDID) injection 0.5 mg (0.5 mg Intravenous Given 10/21/20 0820)    ED Course  I have reviewed the triage vital signs and the nursing notes.  Pertinent labs & imaging results that were available during my care of the patient were reviewed by me and considered in my medical decision making (see chart for details).    MDM Rules/Calculators/A&P                         Back pain secondary to zoster rash.  Her pain is following a pattern of her lower back down to the knee.  No joint effusions, no peripheral edema, distal extremities are warm and dry.  Patient continues to have generalized systemic symptoms.  She was seen within the last 48 hours for vomiting and diarrhea.  At that time patient had hyponatremia and dehydration.  We will recheck labs with electrolytes and lactic acid given patient's presentation of general illness, it does not sound as if she has had much to eat or drink since leaving the hospital.  We will start rehydration with lactated Ringer's.  Patient  does not have surgical abdomen, at this time do not anticipate CT of the abdomen.  Patient had CT scan done within the past 48 hours without acute findings.  Patient does not have headache, fever, meningismus or photophobia to suggest disseminated zoster.  Rash is confined to S1 or L5 distribution. Will start valacyclovir.  Patient has persistent and worsening hyponatremia since last evaluation.  With generalized weakness, poor oral intake and now acute outbreak of shingles, will admit for hydration and electrolyte correction.  Consult: Dr. Marylyn Ishihara [facility] for admission. Final Clinical Impression(s) / ED Diagnoses Final diagnoses:  Herpes zoster involving sacral dermatome  Hyponatremia  Generalized weakness    Rx / DC Orders ED Discharge Orders    None       Charlesetta Shanks, MD 10/21/20 1012

## 2020-10-21 NOTE — ED Triage Notes (Signed)
Pt came in via POV with c/o back and R leg pain that radiates down lateral side to her knee. Pt states she has sacral degeneration and has appt with Dr. Caleen Essex. Pt could not tolerate the pain.

## 2020-10-21 NOTE — ED Notes (Signed)
Report given to [facility] RN at [employer]

## 2020-10-21 NOTE — H&P (Addendum)
History and Physical    Brenda [name] OMV:[id number] DOB: 06/19/1932 DOA: 10/21/2020  PCP: Crist Infante, MD  Patient coming from: Home  Chief Complaint: back pain, N/V  HPI: Brenda [name] is a 87 y.o. female with medical history significant of anxiety, GERD, back pain. She initially reported to the ED 2 days ago with N/V. She had a CT eval that did not reveal any significant lesion. She was given reglan and fluids. She was PO challenged. She improved and felt ok to go home yesterday. When she got home, her nausea returned. There were no fevers or diarrhea. She returned because she had a back pain flare. Patient reports that she has chronic back pain, but it seems to have gotten worse of the last few days. She has a "degenerating sacrum" with her normal pain that starts in that area and radiates down her legs. She states that her pain has worsen and seems to coincide with the development of a rash in the same area. Given the return of her nausea and worsening of her pain, she felt it necessary to return to the ED. She denies any other alleviating or aggravating factors.   ED Course: CT ab/pelvis was negative on 2/19. She was hyponatremic at the time (Na+ 126) and now her numbers are worse (Na+ 122). She was found to have a rash c/w shingles. [facility] was called for admission.   Review of Systems:  Review of systems is otherwise negative for all not mentioned in HPI.   PMHx Past Medical History:  Diagnosis Date  . Anxiety   . Arthritis   . CLL (chronic lymphocytic leukemia) ([place])   . Complication of anesthesia   . Depression   . Depression with anxiety   . Diverticulosis   . Dyspnea   . GERD (gastroesophageal reflux disease)   . Hearing loss   . Memory loss   . Mild cognitive impairment   . Osteopenia   . PONV (postoperative nausea and vomiting)   . Reflux   . SOB (shortness of breath)     PSHx Past Surgical History:  Procedure Laterality Date  . ABDOMINAL HYSTERECTOMY    .  APPENDECTOMY    . BREAST EXCISIONAL BIOPSY Left   . CARDIAC EVENT MONITOR  02/03/2005   30 days, normal event monitor  . CATARACT EXTRACTION Bilateral   . EYE SURGERY     squamous cell carcinoma surgery in right eye lid.  Marland Kitchen KNEE ARTHROSCOPY Right 07/02/2016   Procedure: ARTHROSCOPY KNEE WITH DEBRIDEMENT, PARTIAL LATERAL AND MEDIAL MENISCECTOMY, with drainage of cyst;  Surgeon: Susa Day, MD;  Location: [facility];  Service: Orthopedics;  Laterality: Right;  . NM GATED MYOCARDIAL STUDY (ARMX HX)  01/23/2011   Normal pattern of perfusion in all regions. post-stress EF is 85%. EKG negative for ischemia. no ECG changes.  . TONSILLECTOMY    . TRANSTHORACIC ECHOCARDIOGRAM  10/31/2010   EF >55%, there is concern for inferoacpical infarct with possible inferoapical aneurysm or pseudoaneurysm - could be echo artificat    SocHx  reports that she has never smoked. She has never used smokeless tobacco. She reports that she does not drink alcohol and does not use drugs.  Allergies  Allergen Reactions  . Azithromycin   . Adhesive [Tape] Rash  . Codeine Nausea And Vomiting  . Doxycycline Rash    Severe Reflux , cheek "flushing"  . Sulfa Antibiotics Rash    FamHx Family History  Problem Relation Age of Onset  .  Alzheimer's disease Father   . Dementia Father   . Atrial fibrillation Brother   . Dementia Maternal Grandmother   . Dementia Paternal Grandmother     Prior to Admission medications   Medication Sig Start Date End Date Taking? Authorizing Provider  acetaminophen (TYLENOL) 500 MG tablet Take 500 mg by mouth every 6 (six) hours as needed for moderate pain.    [provider]  atorvastatin (LIPITOR) 20 MG tablet Take 20 mg by mouth daily.    [provider]  cycloSPORINE (RESTASIS) 0.05 % ophthalmic emulsion Place 1 drop into both eyes 2 (two) times daily.    [provider]  desvenlafaxine (PRISTIQ) 50 MG 24 hr tablet Take 50 mg by mouth daily.    [provider]  estradiol (ESTRING) 2 MG vaginal ring Place 2 mg vaginally every 3 (three) months. follow package directions    [provider]  LORazepam (ATIVAN) 0.5 MG tablet Take 0.5 mg by mouth at bedtime.     [provider]  metoCLOPramide (REGLAN) 10 MG tablet Take 1 tablet (10 mg total) by mouth every 6 (six) hours as needed for nausea or vomiting. 10/20/20   Molpus, John, MD  Multiple Vitamin (MULTIVITAMIN) capsule Take 1 capsule by mouth daily.    [provider]  Multiple Vitamins-Minerals (PRESERVISION AREDS 2+MULTI VIT PO) Take 1 capsule by mouth daily.    [provider]  MYRBETRIQ 50 MG TB24 tablet Take 50 mg by mouth daily. 06/02/19   [provider]  ondansetron (ZOFRAN) 8 MG tablet Take 8 mg by mouth every 8 (eight) hours as needed for nausea or vomiting.    [provider]  pantoprazole (PROTONIX) 40 MG tablet Take 40 mg by mouth daily.    [provider]  solifenacin (VESICARE) 5 MG tablet Take 5 mg by mouth daily. 05/16/19   [provider]  ZETIA 10 MG tablet Take 10 mg by mouth at bedtime.  05/19/13   [provider]  zolpidem (AMBIEN) 5 MG tablet Take 5 mg by mouth at bedtime.     [provider]    Physical Exam: Vitals:   10/21/20 9371 10/21/20 0825 10/21/20 0825 10/21/20 0930  BP:    (!) 155/70  Pulse:    72  Resp:    18  Temp:      TempSrc:      SpO2: (!) 83% 91% 91% 98%  Weight:      Height:        General: 87 y.o. female resting in bed in NAD Eyes: PERRL, normal sclera ENMT: Nares patent w/o discharge, orophaynx clear, dentition normal, ears w/o discharge/lesions/ulcers Neck: Supple, trachea midline Cardiovascular: RRR, +S1, S2, no m/g/r, equal pulses throughout Respiratory: CTABL, no w/r/r, normal WOB GI: BS+, NDNT, no masses noted, no organomegaly noted MSK: No e/c/c Skin: No bruises, ulcerations noted; sacral rash c/w shingles Neuro: A&O x 3, no focal  deficits Psyc: Appropriate interaction and affect, calm/cooperative  Labs on Admission: I have personally reviewed following labs and imaging studies  CBC: Recent Labs  Lab 10/21/20 0808  WBC 14.8*  NEUTROABS 7.7  HGB 14.9  HCT 42.2  MCV 86.3  PLT 696   Basic Metabolic Panel: Recent Labs  Lab 10/19/20 2135 10/21/20 0808  NA 126* 122*  K 3.9 3.4*  CL 89* 88*  CO2 22 25  GLUCOSE 135* 140*  BUN 13 14  CREATININE 0.48 0.56  CALCIUM 9.1 8.7*  MG  --  1.9  PHOS  --  1.6*   GFR: Estimated Creatinine Clearance: 53.9 mL/min (by C-G formula based on SCr of 0.56 mg/dL). Liver Function Tests: Recent Labs  Lab 10/19/20 2135 10/21/20 0808  AST 26 48*  ALT 22 28  ALKPHOS 51 45  BILITOT 1.3* 1.4*  PROT 6.9 6.4*  ALBUMIN 4.6 4.5   Recent Labs  Lab 10/19/20 2135 10/21/20 0808  LIPASE 25 27   No results for input(s): AMMONIA in the last 168 hours. Coagulation Profile: No results for input(s): INR, PROTIME in the last 168 hours. Cardiac Enzymes: No results for input(s): CKTOTAL, CKMB, CKMBINDEX, TROPONINI in the last 168 hours. BNP (last 3 results) No results for input(s): PROBNP in the last 8760 hours. HbA1C: No results for input(s): HGBA1C in the last 72 hours. CBG: No results for input(s): GLUCAP in the last 168 hours. Lipid Profile: No results for input(s): CHOL, HDL, LDLCALC, TRIG, CHOLHDL, LDLDIRECT in the last 72 hours. Thyroid Function Tests: No results for input(s): TSH, T4TOTAL, FREET4, T3FREE, THYROIDAB in the last 72 hours. Anemia Panel: No results for input(s): VITAMINB12, FOLATE, FERRITIN, TIBC, IRON, RETICCTPCT in the last 72 hours. Urine analysis:    Component Value Date/Time   COLORURINE YELLOW 10/19/2020 0120   APPEARANCEUR CLEAR 10/19/2020 0120   LABSPEC 1.038 (H) 10/19/2020 0120   PHURINE 6.0 10/19/2020 0120   GLUCOSEU 50 (A) 10/19/2020 0120   HGBUR NEGATIVE 10/19/2020 0120   BILIRUBINUR NEGATIVE 10/19/2020 0120   KETONESUR 80 (A)  10/19/2020 0120   PROTEINUR 100 (A) 10/19/2020 0120   UROBILINOGEN 0.2 04/23/2014 1635   NITRITE NEGATIVE 10/19/2020 0120   LEUKOCYTESUR NEGATIVE 10/19/2020 0120    Radiological Exams on Admission: CT Abdomen Pelvis W Contrast  Result Date: 10/19/2020 CLINICAL DATA:  Nausea vomiting and diarrhea. Bowel obstruction suspected. EXAM: CT ABDOMEN AND PELVIS WITH CONTRAST TECHNIQUE: Multidetector CT imaging of the abdomen and pelvis was performed using the standard protocol following bolus administration of intravenous contrast. CONTRAST:  180mL OMNIPAQUE IOHEXOL 300 MG/ML  SOLN COMPARISON:  04/18/2019 FINDINGS: Lower chest: Mild atelectasis at both lung bases right more than left. This could be simple atelectasis or could possibly relate to aspiration. There is a large hiatal hernia. Hepatobiliary: Liver parenchyma is normal. No calcified gallstones. No biliary obstruction. Pancreas: Normal Spleen: Normal Adrenals/Urinary Tract: Adrenal glands are normal. Kidneys are normal. There are numerous parapelvic cysts but no sign of actual renal obstruction. The bladder is normal, with exception that there is a tiny bit of air. Was the patient catheterized? Stomach/Bowel: Small intestine is normal. The cecum is reflected medially. The appendix is not specifically seen but there is no sign of appendicitis. No diverticulosis or diverticulitis. No sign of colon obstruction. Vascular/Lymphatic: Aortic atherosclerosis. No aneurysm. IVC is normal. No retroperitoneal adenopathy. Numerous phleboliths in dilated ovarian veins on the left. Reproductive: Pessary in place. Previous hysterectomy. No pelvic mass. Other: No free fluid or air. Musculoskeletal: Chronic lumbar degenerative changes without acute finding. IMPRESSION: 1. No acute finding. No evidence of bowel obstruction or other acute bowel pathology. Patient does have a hiatal hernia. 2. Mild atelectasis at both lung bases right more than left. This could be simple  atelectasis or could possibly relate to aspiration. 3. Aortic atherosclerosis. Aortic Atherosclerosis (ICD10-I70.0). Electronically Signed   By: Nelson Chimes M.D.   On: 10/19/2020 23:40    EKG: Independently reviewed. Sinus, no st changes  Assessment/Plan Intractable N/V     - admit to obs, tele     -  anti-emetics     - CT ab/pelvis on 2/19 was negative for obstruction     - denies diarrhea or fever     - anti-emetics, fluids, PO challenge (CLD)  Hyponatremia Hypophosphatemia     - Na+ is 122;  Chloride is down, switch LR to NS at 125, follow q6h renal function panels     - replace phos  Shingles      - she is still nauseous; add IV acycolvir 10 mg/kg TID, add gabapentin for pain   HLD     - statin, zetia  GERD     - protonix  UTI?     - dirty UA     - check UCx, start rocephin  DVT prophylaxis: lovenox  Code Status: FULL  Family Communication: Spoke with husband by phone. Consults called: None   Status is: Observation  The patient remains OBS appropriate and will d/c before 2 midnights.  Dispo: The patient is from: Home              Anticipated d/c is to: Home              Anticipated d/c date is: 1 day              Patient currently is not medically stable to d/c.   Difficult to place patient No  Jonnie Finner DO [facility]  If 7PM-7AM, please contact night-coverage [email or web address]  10/21/2020, 9:50 AM

## 2020-10-21 NOTE — ED Notes (Signed)
Pt placed on 2L [place] for slight O2 desat while pt is sleeping

## 2020-10-22 DIAGNOSIS — Z9841 Cataract extraction status, right eye: Secondary | ICD-10-CM | POA: Diagnosis not present

## 2020-10-22 DIAGNOSIS — Z820 Family history of epilepsy and other diseases of the nervous system: Secondary | ICD-10-CM | POA: Diagnosis not present

## 2020-10-22 DIAGNOSIS — Z885 Allergy status to narcotic agent status: Secondary | ICD-10-CM | POA: Diagnosis not present

## 2020-10-22 DIAGNOSIS — K219 Gastro-esophageal reflux disease without esophagitis: Secondary | ICD-10-CM | POA: Diagnosis present

## 2020-10-22 DIAGNOSIS — H9190 Unspecified hearing loss, unspecified ear: Secondary | ICD-10-CM | POA: Diagnosis present

## 2020-10-22 DIAGNOSIS — R531 Weakness: Secondary | ICD-10-CM | POA: Diagnosis not present

## 2020-10-22 DIAGNOSIS — B029 Zoster without complications: Secondary | ICD-10-CM | POA: Diagnosis present

## 2020-10-22 DIAGNOSIS — G3184 Mild cognitive impairment, so stated: Secondary | ICD-10-CM | POA: Diagnosis present

## 2020-10-22 DIAGNOSIS — Z20822 Contact with and (suspected) exposure to covid-19: Secondary | ICD-10-CM | POA: Diagnosis present

## 2020-10-22 DIAGNOSIS — E785 Hyperlipidemia, unspecified: Secondary | ICD-10-CM | POA: Diagnosis present

## 2020-10-22 DIAGNOSIS — B9620 Unspecified Escherichia coli [E. coli] as the cause of diseases classified elsewhere: Secondary | ICD-10-CM | POA: Diagnosis present

## 2020-10-22 DIAGNOSIS — E871 Hypo-osmolality and hyponatremia: Secondary | ICD-10-CM | POA: Diagnosis not present

## 2020-10-22 DIAGNOSIS — E876 Hypokalemia: Secondary | ICD-10-CM | POA: Diagnosis present

## 2020-10-22 DIAGNOSIS — G8929 Other chronic pain: Secondary | ICD-10-CM | POA: Diagnosis present

## 2020-10-22 DIAGNOSIS — Z882 Allergy status to sulfonamides status: Secondary | ICD-10-CM | POA: Diagnosis not present

## 2020-10-22 DIAGNOSIS — F32A Depression, unspecified: Secondary | ICD-10-CM | POA: Diagnosis present

## 2020-10-22 DIAGNOSIS — M25561 Pain in right knee: Secondary | ICD-10-CM | POA: Diagnosis not present

## 2020-10-22 DIAGNOSIS — M545 Low back pain, unspecified: Secondary | ICD-10-CM | POA: Diagnosis present

## 2020-10-22 DIAGNOSIS — Z888 Allergy status to other drugs, medicaments and biological substances status: Secondary | ICD-10-CM | POA: Diagnosis not present

## 2020-10-22 DIAGNOSIS — Z856 Personal history of leukemia: Secondary | ICD-10-CM | POA: Diagnosis not present

## 2020-10-22 DIAGNOSIS — N390 Urinary tract infection, site not specified: Secondary | ICD-10-CM | POA: Diagnosis present

## 2020-10-22 DIAGNOSIS — F419 Anxiety disorder, unspecified: Secondary | ICD-10-CM | POA: Diagnosis present

## 2020-10-22 DIAGNOSIS — E861 Hypovolemia: Secondary | ICD-10-CM | POA: Diagnosis present

## 2020-10-22 DIAGNOSIS — M47816 Spondylosis without myelopathy or radiculopathy, lumbar region: Secondary | ICD-10-CM | POA: Diagnosis not present

## 2020-10-22 DIAGNOSIS — B0229 Other postherpetic nervous system involvement: Secondary | ICD-10-CM | POA: Diagnosis not present

## 2020-10-22 DIAGNOSIS — Z1611 Resistance to penicillins: Secondary | ICD-10-CM | POA: Diagnosis present

## 2020-10-22 DIAGNOSIS — Z9842 Cataract extraction status, left eye: Secondary | ICD-10-CM | POA: Diagnosis not present

## 2020-10-22 DIAGNOSIS — F418 Other specified anxiety disorders: Secondary | ICD-10-CM | POA: Diagnosis present

## 2020-10-22 LAB — RENAL FUNCTION PANEL
Albumin: 4.1 g/dL (ref 3.5–5.0)
Albumin: 3.8 g/dL (ref 3.5–5.0)
Anion gap: 9 (ref 5–15)
Anion gap: 8 (ref 5–15)
BUN: 7 mg/dL — ABNORMAL LOW (ref 8–23)
BUN: 6 mg/dL — ABNORMAL LOW (ref 8–23)
CO2: 25 mmol/L (ref 22–32)
CO2: 25 mmol/L (ref 22–32)
Calcium: 8.4 mg/dL — ABNORMAL LOW (ref 8.9–10.3)
Calcium: 8.4 mg/dL — ABNORMAL LOW (ref 8.9–10.3)
Chloride: 96 mmol/L — ABNORMAL LOW (ref 98–111)
Chloride: 104 mmol/L (ref 98–111)
Creatinine, Ser: 0.61 mg/dL (ref 0.44–1.00)
Creatinine, Ser: 0.62 mg/dL (ref 0.44–1.00)
GFR, Estimated: >60 mL/min (ref >60)
GFR, Estimated: >60 mL/min (ref >60)
Glucose, Bld: 117 mg/dL — ABNORMAL HIGH (ref 70–99)
Glucose, Bld: 177 mg/dL — ABNORMAL HIGH (ref 70–99)
Phosphorus: 3 mg/dL (ref 2.5–4.6)
Phosphorus: 2.3 mg/dL — ABNORMAL LOW (ref 2.5–4.6)
Potassium: 3.6 mmol/L (ref 3.5–5.1)
Potassium: 3.6 mmol/L (ref 3.5–5.1)
Sodium: 130 mmol/L — ABNORMAL LOW (ref 135–145)
Sodium: 137 mmol/L (ref 135–145)

## 2020-10-22 LAB — URINE CULTURE: Culture: >=100000 — AB

## 2020-10-22 LAB — CBC
HCT: 44.4 % (ref 36.0–46.0)
Hemoglobin: 15.1 g/dL — ABNORMAL HIGH (ref 12.0–15.0)
MCH: 30.3 pg (ref 26.0–34.0)
MCHC: 34 g/dL (ref 30.0–36.0)
MCV: 89.2 fL (ref 80.0–100.0)
Platelets: 262 10*3/uL (ref 150–400)
RBC: 4.98 MIL/uL (ref 3.87–5.11)
RDW: 13 % (ref 11.5–15.5)
WBC: 15.3 10*3/uL — ABNORMAL HIGH (ref 4.0–10.5)
nRBC: 0 % (ref 0.0–0.2)

## 2020-10-22 MED ORDER — ENOXAPARIN SODIUM 40 MG/0.4ML ~~LOC~~ SOLN
40.0000 mg | SUBCUTANEOUS | Status: DC
  Administered 2020-10-22: 40 mg via SUBCUTANEOUS
  Filled 2020-10-22: qty 0.4

## 2020-10-22 MED ORDER — VALACYCLOVIR HCL 500 MG PO TABS
1000.0000 mg | ORAL_TABLET | Freq: Three times a day (TID) | ORAL | Status: DC
  Administered 2020-10-22 – 2020-10-23 (×4): 1000 mg via ORAL
  Filled 2020-10-22 (×4): qty 2

## 2020-10-22 MED ORDER — SODIUM CHLORIDE 0.9 % IV SOLN: INTRAVENOUS | Status: DC

## 2020-10-22 NOTE — Plan of Care (Signed)

## 2020-10-22 NOTE — Progress Notes (Signed)
PROGRESS NOTE  Brenda [name] OVF:[id number] DOB: 11-Jun-1932 DOA: 10/21/2020 PCP: Crist Infante, MD  HPI/Recap of past 24 hours: Brenda [name] is a 87 y.o. female with medical history significant of anxiety, GERD, back pain. She initially reported to the ED 2 days ago with N/V. She had a CT eval that did not reveal any significant lesion. She was given reglan and fluids. She was PO challenged. She improved and felt ok to go home yesterday. When she got home, her nausea returned. There were no fevers or diarrhea. She returned because she had a back pain flare. Patient reports that she has chronic back pain, but it seems to have gotten worse of the last few days. She has a "degenerating sacrum" with her normal pain that starts in that area and radiates down her legs. She states that her pain has worsen and seems to coincide with the development of a rash in the same area. Given the return of her nausea and worsening of her pain, she felt it necessary to return to the ED. She denies any other alleviating or aggravating factors.   ED Course: CT ab/pelvis was negative on 2/19. She was hyponatremic at the time (Na+ 126) and now her numbers are worse (Na+ 122). She was found to have a rash c/w shingles. [facility] was called for admission.   10/22/20: Seen and examined at bedside.  She reports significant pain in her right buttock at the site of shingles lesions.  Assessment/Plan: Active Problems:   Hyponatremia  Intractable nausea and vomiting, unclear etiology, possibly secondary to E. coli UTI, POA CT abdomen pelvis done on 10/19/2016 was negative for obstruction. Continue IV antiemetics and IV fluids Diet as tolerated.  E. coli UTI, POA Urine culture done on 10/19/2020 + for: Colonies of E. coli, resistant to ampicillin, ampicillin/sulbactam, cefazolin. Rocephin started on 10/21/2020, continue.  Right buttock shingles lesions, POA Continue Valtrex Continue pain control  Resolved hypovolemic  hyponatremia Presented with serum sodium of 121 Sodium 137, decreased normal saline rate, down to 30 cc/h  Resolved hypokalemia Repleted  GERD Continue Protonix  Chronic anxiety/depression Continue Paxil  Hyperlipidemia Continue Lipitor and Zetia   Code Status: Full code.  Family Communication: None at bedside.  Disposition Plan: Likely will DC to home on 10/23/2020   Consultants:  None.  Procedures:  None.  Antimicrobials:  Rocephin  Valtrex for shingles  DVT prophylaxis: Subcu Lovenox daily   Status is: Observation   Dispo:  Patient From: Home  Planned Disposition: Home with [facility] Care [facility]  Expected discharge date: 10/24/2020  Medically stable for discharge: No, ongoing management of UTI and shingles.       Objective: Vitals:   10/22/20 0258 10/22/20 0637 10/22/20 1109 10/22/20 1410  BP: 137/64 (!) 148/65 (!) 147/72 131/90  Pulse: 67 64 62 91  Resp: 18 18 20 15   Temp: 98 F (36.7 C) 97.9 F (36.6 C) 97.9 F (36.6 C) 97.7 F (36.5 C)  TempSrc: Oral Oral Oral Oral  SpO2: 95% 97% 96% 98%  Weight:      Height:       No intake or output data in the 24 hours ending 10/22/20 1704 Filed Weights   10/21/20 0656 10/21/20 2312  Weight: 80.3 kg 76.8 kg    Exam:  . General: 87 y.o. year-old female well developed well nourished in no acute distress.  Alert and oriented x3. . Cardiovascular: Regular rate and rhythm with no rubs or gallops.  No thyromegaly or  JVD noted.   Marland Kitchen Respiratory: Clear to auscultation with no wheezes or rales. Good inspiratory effort. . Abdomen: Soft nontender nondistended with normal bowel sounds x4 quadrants. . Musculoskeletal: No lower extremity edema. 2/4 pulses in all 4 extremities. . Skin: Right buttock with shingles lesions.  Lesions do not cross Midline. Marland Kitchen Psychiatry: Mood is appropriate for condition and setting   Data Reviewed: CBC: Recent Labs  Lab 10/21/20 0808 10/22/20 0146  WBC 14.8* 15.3*  NEUTROABS  7.7  --   HGB 14.9 15.1*  HCT 42.2 44.4  MCV 86.3 89.2  PLT 248 161   Basic Metabolic Panel: Recent Labs  Lab 10/19/20 2135 10/21/20 0808 10/21/20 1350 10/22/20 0146 10/22/20 0901  NA 126* 122* 121* 130* 137  K 3.9 3.4* 3.3* 3.6 3.6  CL 89* 88* 89* 96* 104  CO2 22 25 25 25 25   GLUCOSE 135* 140* 144* 117* 177*  BUN 13 14 12  7* 6*  CREATININE 0.48 0.56 0.52 0.61 0.62  CALCIUM 9.1 8.7* 7.8* 8.4* 8.4*  MG  --  1.9  --   --   --   PHOS  --  1.6* 1.8* 3.0 2.3*   GFR: Estimated Creatinine Clearance: 52.8 mL/min (by C-G formula based on SCr of 0.62 mg/dL). Liver Function Tests: Recent Labs  Lab 10/19/20 2135 10/21/20 0808 10/21/20 1350 10/22/20 0146 10/22/20 0901  AST 26 48*  --   --   --   ALT 22 28  --   --   --   ALKPHOS 51 45  --   --   --   BILITOT 1.3* 1.4*  --   --   --   PROT 6.9 6.4*  --   --   --   ALBUMIN 4.6 4.5 3.9 4.1 3.8   Recent Labs  Lab 10/19/20 2135 10/21/20 0808  LIPASE 25 27   No results for input(s): AMMONIA in the last 168 hours. Coagulation Profile: No results for input(s): INR, PROTIME in the last 168 hours. Cardiac Enzymes: No results for input(s): CKTOTAL, CKMB, CKMBINDEX, TROPONINI in the last 168 hours. BNP (last 3 results) No results for input(s): PROBNP in the last 8760 hours. HbA1C: No results for input(s): HGBA1C in the last 72 hours. CBG: No results for input(s): GLUCAP in the last 168 hours. Lipid Profile: No results for input(s): CHOL, HDL, LDLCALC, TRIG, CHOLHDL, LDLDIRECT in the last 72 hours. Thyroid Function Tests: No results for input(s): TSH, T4TOTAL, FREET4, T3FREE, THYROIDAB in the last 72 hours. Anemia Panel: No results for input(s): VITAMINB12, FOLATE, FERRITIN, TIBC, IRON, RETICCTPCT in the last 72 hours. Urine analysis:    Component Value Date/Time   COLORURINE YELLOW 10/21/2020 1311   APPEARANCEUR HAZY (A) 10/21/2020 1311   LABSPEC 1.015 10/21/2020 1311   PHURINE 6.0 10/21/2020 1311   GLUCOSEU NEGATIVE  10/21/2020 1311   HGBUR SMALL (A) 10/21/2020 1311   BILIRUBINUR NEGATIVE 10/21/2020 1311   KETONESUR 20 (A) 10/21/2020 1311   PROTEINUR 30 (A) 10/21/2020 1311   UROBILINOGEN 0.2 04/23/2014 1635   NITRITE NEGATIVE 10/21/2020 1311   LEUKOCYTESUR LARGE (A) 10/21/2020 1311   Sepsis Labs: @LABRCNTIP (procalcitonin:4,lacticidven:4)  ) Recent Results (from the past 240 hour(s))  Urine culture     Status: Abnormal   Collection Time: 10/19/20  1:20 AM   Specimen: Urine, Catheterized  Result Value Ref Range Status   Specimen Description   Final    URINE, CATHETERIZED Performed at [facility], [address] [place], [place]  [address]    Special Requests   Final    NONE Performed at [facility], [place] [address], [place], [place] [address]    Culture >=100,000 COLONIES/mL ESCHERICHIA COLI (A)  Final   Report Status 10/22/2020 FINAL  Final   Organism ID, Bacteria ESCHERICHIA COLI (A)  Final      Susceptibility   Escherichia coli - MIC*    AMPICILLIN >=32 RESISTANT Resistant     CEFAZOLIN >=64 RESISTANT Resistant     CEFEPIME <=0.12 SENSITIVE Sensitive     CEFTRIAXONE 0.5 SENSITIVE Sensitive     CIPROFLOXACIN <=0.25 SENSITIVE Sensitive     GENTAMICIN <=1 SENSITIVE Sensitive     IMIPENEM <=0.25 SENSITIVE Sensitive     NITROFURANTOIN <=16 SENSITIVE Sensitive     TRIMETH/SULFA <=20 SENSITIVE Sensitive     AMPICILLIN/SULBACTAM >=32 RESISTANT Resistant     PIP/TAZO <=4 SENSITIVE Sensitive     * >=100,000 COLONIES/mL ESCHERICHIA COLI  Resp Panel by RT-PCR (Flu A&B, Covid) Nasopharyngeal Swab     Status: None   Collection Time: 10/21/20  9:48 AM   Specimen: Nasopharyngeal Swab; Nasopharyngeal(NP) swabs in vial transport medium  Result Value Ref Range Status   SARS Coronavirus 2 by RT PCR NEGATIVE NEGATIVE Final    Comment: (NOTE) SARS-CoV-2 target nucleic acids are NOT DETECTED.  The SARS-CoV-2 RNA is generally detectable in upper  respiratory specimens during the acute phase of infection. The lowest concentration of SARS-CoV-2 viral copies this assay can detect is 138 copies/mL. A negative result does not preclude SARS-Cov-2 infection and should not be used as the sole basis for treatment or other patient management decisions. A negative result may occur with  improper specimen collection/handling, submission of specimen other than nasopharyngeal swab, presence of viral mutation(s) within the areas targeted by this assay, and inadequate number of viral copies(<138 copies/mL). A negative result must be combined with clinical observations, patient history, and epidemiological information. The expected result is Negative.  Fact Sheet for Patients:  EntrepreneurPulse.com.au  Fact Sheet for Healthcare Providers:  IncredibleEmployment.be  This test is no t yet approved or cleared by the [place] FDA and  has been authorized for detection and/or diagnosis of SARS-CoV-2 by FDA under an Emergency Use Authorization (EUA). This EUA will remain  in effect (meaning this test can be used) for the duration of the COVID-19 declaration under Section 564(b)(1) of the Act, 21 U.S.C.section 360bbb-3(b)(1), unless the authorization is terminated  or revoked sooner.       Influenza A by PCR NEGATIVE NEGATIVE Final   Influenza B by PCR NEGATIVE NEGATIVE Final    Comment: (NOTE) The Xpert Xpress SARS-CoV-2/FLU/RSV plus assay is intended as an aid in the diagnosis of influenza from Nasopharyngeal swab specimens and should not be used as a sole basis for treatment. Nasal washings and aspirates are unacceptable for Xpert Xpress SARS-CoV-2/FLU/RSV testing.  Fact Sheet for Patients: EntrepreneurPulse.com.au  Fact Sheet for Healthcare Providers: IncredibleEmployment.be  This test is not yet approved or cleared by the [place] FDA and has been  authorized for detection and/or diagnosis of SARS-CoV-2 by FDA under an Emergency Use Authorization (EUA). This EUA will remain in effect (meaning this test can be used) for the duration of the COVID-19 declaration under Section 564(b)(1) of the Act, 21 U.S.C. section 360bbb-3(b)(1), unless the authorization is terminated or revoked.  Performed at [facility], [place] [address] [place], [place] [address]       Studies: No results found.  Scheduled Meds: . atorvastatin  20 mg Oral Daily  . cycloSPORINE  1 drop Both Eyes BID  . darifenacin  7.5 mg Oral Daily  . ezetimibe  10 mg Oral QHS  . gabapentin  200 mg Oral TID  . LORazepam  0.5 mg Oral QHS  . mirabegron ER  50 mg Oral Daily  . pantoprazole (PROTONIX) IV  40 mg Intravenous Daily  . valACYclovir  1,000 mg Oral TID  . venlafaxine XR  75 mg Oral Q breakfast  . zolpidem  5 mg Oral QHS    Continuous Infusions: . sodium chloride 30 mL/hr at 10/22/20 0557  . cefTRIAXone (ROCEPHIN)  IV 1 g (10/21/20 1855)     LOS: 0 days     Kayleen Memos, MD [facility] Pager [phone]  If 7PM-7AM, please contact night-coverage [email or web address] Password [facility] 10/22/2020, 5:04 PM

## 2020-10-22 NOTE — Plan of Care (Signed)

## 2020-10-23 ENCOUNTER — Telehealth: Payer: Self-pay | Admitting: Emergency Medicine

## 2020-10-23 DIAGNOSIS — B0229 Other postherpetic nervous system involvement: Secondary | ICD-10-CM

## 2020-10-23 DIAGNOSIS — R531 Weakness: Secondary | ICD-10-CM

## 2020-10-23 DIAGNOSIS — M25561 Pain in right knee: Secondary | ICD-10-CM | POA: Diagnosis not present

## 2020-10-23 DIAGNOSIS — M47816 Spondylosis without myelopathy or radiculopathy, lumbar region: Secondary | ICD-10-CM | POA: Diagnosis not present

## 2020-10-23 LAB — BASIC METABOLIC PANEL
Anion gap: 7 (ref 5–15)
BUN: 7 mg/dL — ABNORMAL LOW (ref 8–23)
CO2: 22 mmol/L (ref 22–32)
Calcium: 8.1 mg/dL — ABNORMAL LOW (ref 8.9–10.3)
Chloride: 105 mmol/L (ref 98–111)
Creatinine, Ser: 0.58 mg/dL (ref 0.44–1.00)
GFR, Estimated: >60 mL/min (ref >60)
Glucose, Bld: 103 mg/dL — ABNORMAL HIGH (ref 70–99)
Potassium: 3.8 mmol/L (ref 3.5–5.1)
Sodium: 134 mmol/L — ABNORMAL LOW (ref 135–145)

## 2020-10-23 LAB — CBC
HCT: 39.1 % (ref 36.0–46.0)
Hemoglobin: 13 g/dL (ref 12.0–15.0)
MCH: 30.4 pg (ref 26.0–34.0)
MCHC: 33.2 g/dL (ref 30.0–36.0)
MCV: 91.4 fL (ref 80.0–100.0)
Platelets: 225 10*3/uL (ref 150–400)
RBC: 4.28 MIL/uL (ref 3.87–5.11)
RDW: 13.7 % (ref 11.5–15.5)
WBC: 8.8 10*3/uL (ref 4.0–10.5)
nRBC: 0 % (ref 0.0–0.2)

## 2020-10-23 LAB — MAGNESIUM: Magnesium: 2 mg/dL (ref 1.7–2.4)

## 2020-10-23 MED ORDER — VALACYCLOVIR HCL 1 G PO TABS: 1000.0000 mg | ORAL_TABLET | Freq: Three times a day (TID) | ORAL | 0 refills | Status: AC

## 2020-10-23 MED ORDER — GABAPENTIN 100 MG PO CAPS: 200.0000 mg | ORAL_CAPSULE | Freq: Three times a day (TID) | ORAL | 0 refills | Status: DC

## 2020-10-23 MED ORDER — CIPROFLOXACIN HCL 500 MG PO TABS: 500.0000 mg | ORAL_TABLET | Freq: Two times a day (BID) | ORAL | 0 refills | Status: AC

## 2020-10-23 NOTE — Progress Notes (Signed)
Pushed down by [place] NA , to the front entrance of the hospital.

## 2020-10-23 NOTE — Progress Notes (Signed)
Culture Abnormal >=100,000 COLONIES/mL ESCHERICHIA COLI  SUSCEPTIBILITIES TO FOLLOW  Performed at [place] [address] [place], [place] [address]     Urine culture resulted. Made MD Hosie Poisson aware via secure method.

## 2020-10-23 NOTE — Progress Notes (Signed)
Discharge instructions explained and given to patient and daughter Brenda [name]. They both endorsed understanding,telemetry box, removed and clean and return to stored place. Patient alert and neurologically intact upon leaving. No other needs identified.

## 2020-10-23 NOTE — Plan of Care (Signed)

## 2020-10-23 NOTE — Telephone Encounter (Signed)
Post ED Visit - Positive Culture Follow-up  Culture report reviewed by antimicrobial stewardship pharmacist: [place] Team []  Elenor Quinones, Pharm.D. []  Heide Guile, Pharm.D., BCPS AQ-ID []  Parks Neptune, Pharm.D., BCPS []  Alycia Rossetti, Pharm.D., BCPS []  [place], [place].D., BCPS, AAHIVP []  Legrand Como, Pharm.D., BCPS, AAHIVP []  Salome Arnt, PharmD, BCPS []  Johnnette Gourd, PharmD, BCPS []  Hughes Better, PharmD, BCPS []  Leeroy Cha, PharmD []  Laqueta Linden, PharmD, BCPS []  Albertina Parr, PharmD  [place] Team []  Leodis Sias, PharmD []  Lindell Spar, PharmD []  Royetta Asal, PharmD []  Graylin Shiver, Rph []  Rema Fendt) Glennon Mac, PharmD []  Arlyn Dunning, PharmD []  Netta Cedars, PharmD []  Dia Sitter, PharmD []  Leone Haven, PharmD []  Gretta Arab, PharmD []  Theodis Shove, PharmD []  Peggyann Juba, PharmD []  Reuel Boom, PharmD   Positive urine culture Treated with none, currently a inpatient @ [facility], no further patient follow-up is required at this time.  Hazle Nordmann 10/23/2020, 10:02 AM

## 2020-10-24 LAB — URINE CULTURE: Culture: >=100000 — AB

## 2020-10-24 NOTE — Discharge Summary (Signed)
Physician Discharge Summary  Brenda [name] WFU:[id number] DOB: [birth year] DOA: 10/21/2020  PCP: Crist Infante, MD  Admit date: 10/21/2020 Discharge date: 10/23/2020  Admitted From:Home.  Disposition:  Home.   Recommendations for Outpatient Follow-up:  1. Follow up with PCP in 1-2 weeks 2. Please obtain BMP/CBC in one week Please follow up with Urology in one week.    Discharge Condition:stable.  CODE STATUS:full code.  Diet recommendation: Heart Healthy  Brief/Interim Summary: Brenda [name] a 87 y.o.femalewith medical history significant ofanxiety, GERD, back pain. She initially reported to the ED 2 days ago with N/V. She had a CT eval that did not reveal any significant lesion. She was given reglan and fluids. She was PO challenged. She improved and felt ok to go home yesterday. When she got home, her nausea returned. There were no fevers or diarrhea. She returned because she had a back pain flare. Patient reports that she has chronic back pain, but it seems to have gotten worse of the last few days. She has a "degenerating sacrum" with her normal pain that starts in that area and radiates down her legs. She states that her pain has worsen and seems to coincide with the development of a rash in the same area. Given the return of her nausea and worsening of her pain, she felt it necessary to return to the ED. She denies any other alleviating or aggravating factors.  ED Course:CT ab/pelvis was negative on 2/19. She was hyponatremic at the time (Na+ 126) and now her numbers are worse (Na+ 122). She was found to have a rash c/w shingles. [facility] was called for admission.  Discharge Diagnoses:  Active Problems:   Hyponatremia  Intractable nausea and vomiting, unclear etiology, possibly secondary to E. coli UTI, POA CT abdomen pelvis done on 10/19/2016 was negative for obstruction. Symptoms resolved .   E. coli UTI, POA Urine culture done on 10/19/2020 + for: Colonies of E. coli,  resistant to ampicillin, ampicillin/sulbactam, cefazolin. Rocephin started on 10/21/2020, sensitivities reviewed and she is discharged on oral ciprofloxacin to complete the course.   Right buttock shingles lesions, POA Continue Valtrex Continue pain control  Resolved hypovolemic hyponatremia Presented with serum sodium of 121 Improved to 134 with IV fluids.   Resolved hypokalemia Repleted  GERD Continue Protonix  Chronic anxiety/depression Continue Paxil  Hyperlipidemia Continue Lipitor and Zetia  Discharge Instructions  Discharge Instructions    Diet - low sodium heart healthy   Complete by: As directed    Discharge instructions   Complete by: As directed    Please follow up with Urology in one week.     Allergies as of 10/23/2020      Reactions   Azithromycin    Adhesive [tape] Rash   Codeine Nausea And Vomiting   Doxycycline Rash   Severe Reflux , cheek "flushing"   Sulfa Antibiotics Rash      Medication List    TAKE these medications   acetaminophen 500 MG tablet Commonly known as: TYLENOL Take 500 mg by mouth every 6 (six) hours as needed for moderate pain.   atorvastatin 20 MG tablet Commonly known as: LIPITOR Take 20 mg by mouth daily.   ciprofloxacin 500 MG tablet Commonly known as: Cipro Take 1 tablet (500 mg total) by mouth 2 (two) times daily for 6 days.   cycloSPORINE 0.05 % ophthalmic emulsion Commonly known as: RESTASIS Place 1 drop into both eyes 2 (two) times daily.   desvenlafaxine 50 MG 24 hr tablet  Commonly known as: PRISTIQ Take 50 mg by mouth daily.   estradiol 2 MG vaginal ring Commonly known as: ESTRING Place 2 mg vaginally every 3 (three) months. follow package directions   gabapentin 100 MG capsule Commonly known as: NEURONTIN Take 2 capsules (200 mg total) by mouth 3 (three) times daily for 7 days.   LORazepam 0.5 MG tablet Commonly known as: ATIVAN Take 0.5 mg by mouth at bedtime.   metoCLOPramide 10 MG  tablet Commonly known as: Reglan Take 1 tablet (10 mg total) by mouth every 6 (six) hours as needed for nausea or vomiting.   multivitamin capsule Take 1 capsule by mouth daily.   Myrbetriq 50 MG Tb24 tablet Generic drug: mirabegron ER Take 50 mg by mouth daily.   ondansetron 8 MG disintegrating tablet Commonly known as: ZOFRAN-ODT Take 8 mg by mouth every 8 (eight) hours as needed for nausea or vomiting.   pantoprazole 40 MG tablet Commonly known as: PROTONIX Take 40 mg by mouth daily.   PRESERVISION AREDS 2+MULTI VIT PO Take 1 capsule by mouth daily.   solifenacin 5 MG tablet Commonly known as: VESICARE Take 5 mg by mouth daily.   valACYclovir 1000 MG tablet Commonly known as: VALTREX Take 1 tablet (1,000 mg total) by mouth 3 (three) times daily for 6 days.   Zetia 10 MG tablet Generic drug: ezetimibe Take 10 mg by mouth at bedtime.   zolpidem 5 MG tablet Commonly known as: AMBIEN Take 5 mg by mouth at bedtime.       Allergies  Allergen Reactions  . Azithromycin   . Adhesive [Tape] Rash  . Codeine Nausea And Vomiting  . Doxycycline Rash    Severe Reflux , cheek "flushing"  . Sulfa Antibiotics Rash    Consultations:  None.    Procedures/Studies: CT Abdomen Pelvis W Contrast  Result Date: 10/19/2020 CLINICAL DATA:  Nausea vomiting and diarrhea. Bowel obstruction suspected. EXAM: CT ABDOMEN AND PELVIS WITH CONTRAST TECHNIQUE: Multidetector CT imaging of the abdomen and pelvis was performed using the standard protocol following bolus administration of intravenous contrast. CONTRAST:  176mL OMNIPAQUE IOHEXOL 300 MG/ML  SOLN COMPARISON:  04/18/2019 FINDINGS: Lower chest: Mild atelectasis at both lung bases right more than left. This could be simple atelectasis or could possibly relate to aspiration. There is a large hiatal hernia. Hepatobiliary: Liver parenchyma is normal. No calcified gallstones. No biliary obstruction. Pancreas: Normal Spleen: Normal  Adrenals/Urinary Tract: Adrenal glands are normal. Kidneys are normal. There are numerous parapelvic cysts but no sign of actual renal obstruction. The bladder is normal, with exception that there is a tiny bit of air. Was the patient catheterized? Stomach/Bowel: Small intestine is normal. The cecum is reflected medially. The appendix is not specifically seen but there is no sign of appendicitis. No diverticulosis or diverticulitis. No sign of colon obstruction. Vascular/Lymphatic: Aortic atherosclerosis. No aneurysm. IVC is normal. No retroperitoneal adenopathy. Numerous phleboliths in dilated ovarian veins on the left. Reproductive: Pessary in place. Previous hysterectomy. No pelvic mass. Other: No free fluid or air. Musculoskeletal: Chronic lumbar degenerative changes without acute finding. IMPRESSION: 1. No acute finding. No evidence of bowel obstruction or other acute bowel pathology. Patient does have a hiatal hernia. 2. Mild atelectasis at both lung bases right more than left. This could be simple atelectasis or could possibly relate to aspiration. 3. Aortic atherosclerosis. Aortic Atherosclerosis (ICD10-I70.0). Electronically Signed   By: Nelson Chimes M.D.   On: 10/19/2020 23:40       Subjective:  No new complaints.   Discharge Exam: Vitals:   10/22/20 2217 10/23/20 0520  BP: (!) 119/54 (!) 164/72  Pulse: 70 62  Resp: 20   Temp: 97.7 F (36.5 C) 97.7 F (36.5 C)  SpO2: 95% 97%   Vitals:   10/22/20 1109 10/22/20 1410 10/22/20 2217 10/23/20 0520  BP: (!) 147/72 131/90 (!) 119/54 (!) 164/72  Pulse: 62 91 70 62  Resp: 20 15 20    Temp: 97.9 F (36.6 C) 97.7 F (36.5 C) 97.7 F (36.5 C) 97.7 F (36.5 C)  TempSrc: Oral Oral Oral Oral  SpO2: 96% 98% 95% 97%  Weight:      Height:        General: Pt is alert, awake, not in acute distress Cardiovascular: RRR, S1/S2 +, no rubs, no gallops Respiratory: CTA bilaterally, no wheezing, no rhonchi Abdominal: Soft, NT, ND, bowel sounds  + Extremities: no edema, no cyanosis    The results of significant diagnostics from this hospitalization (including imaging, microbiology, ancillary and laboratory) are listed below for reference.     Microbiology: Recent Results (from the past 240 hour(s))  Urine culture     Status: Abnormal   Collection Time: 10/19/20  1:20 AM   Specimen: Urine, Catheterized  Result Value Ref Range Status   Specimen Description   Final    URINE, CATHETERIZED Performed at [facility] [address], [place], [place] [address]    Special Requests   Final    NONE Performed at [facility], [place] [address], [place], [place] [address]    Culture >=100,000 COLONIES/mL ESCHERICHIA COLI (A)  Final   Report Status 10/22/2020 FINAL  Final   Organism ID, Bacteria ESCHERICHIA COLI (A)  Final      Susceptibility   Escherichia coli - MIC*    AMPICILLIN >=32 RESISTANT Resistant     CEFAZOLIN >=64 RESISTANT Resistant     CEFEPIME <=0.12 SENSITIVE Sensitive     CEFTRIAXONE 0.5 SENSITIVE Sensitive     CIPROFLOXACIN <=0.25 SENSITIVE Sensitive     GENTAMICIN <=1 SENSITIVE Sensitive     IMIPENEM <=0.25 SENSITIVE Sensitive     NITROFURANTOIN <=16 SENSITIVE Sensitive     TRIMETH/SULFA <=20 SENSITIVE Sensitive     AMPICILLIN/SULBACTAM >=32 RESISTANT Resistant     PIP/TAZO <=4 SENSITIVE Sensitive     * >=100,000 COLONIES/mL ESCHERICHIA COLI  Resp Panel by RT-PCR (Flu A&B, Covid) Nasopharyngeal Swab     Status: None   Collection Time: 10/21/20  9:48 AM   Specimen: Nasopharyngeal Swab; Nasopharyngeal(NP) swabs in vial transport medium  Result Value Ref Range Status   SARS Coronavirus 2 by RT PCR NEGATIVE NEGATIVE Final    Comment: (NOTE) SARS-CoV-2 target nucleic acids are NOT DETECTED.  The SARS-CoV-2 RNA is generally detectable in upper respiratory specimens during the acute phase of infection. The lowest concentration of SARS-CoV-2 viral copies this assay can detect  is 138 copies/mL. A negative result does not preclude SARS-Cov-2 infection and should not be used as the sole basis for treatment or other patient management decisions. A negative result may occur with  improper specimen collection/handling, submission of specimen other than nasopharyngeal swab, presence of viral mutation(s) within the areas targeted by this assay, and inadequate number of viral copies(<138 copies/mL). A negative result must be combined with clinical observations, patient history, and epidemiological information. The expected result is Negative.  Fact Sheet for Patients:  EntrepreneurPulse.com.au  Fact Sheet for Healthcare Providers:  IncredibleEmployment.be  This test is  no t yet approved or cleared by the [place] and  has been authorized for detection and/or diagnosis of SARS-CoV-2 by FDA under an Emergency Use Authorization (EUA). This EUA will remain  in effect (meaning this test can be used) for the duration of the COVID-19 declaration under Section 564(b)(1) of the Act, 21 U.S.C.section 360bbb-3(b)(1), unless the authorization is terminated  or revoked sooner.       Influenza A by PCR NEGATIVE NEGATIVE Final   Influenza B by PCR NEGATIVE NEGATIVE Final    Comment: (NOTE) The Xpert Xpress SARS-CoV-2/FLU/RSV plus assay is intended as an aid in the diagnosis of influenza from Nasopharyngeal swab specimens and should not be used as a sole basis for treatment. Nasal washings and aspirates are unacceptable for Xpert Xpress SARS-CoV-2/FLU/RSV testing.  Fact Sheet for Patients: EntrepreneurPulse.com.au  Fact Sheet for Healthcare Providers: IncredibleEmployment.be  This test is not yet approved or cleared by the [place] FDA and has been authorized for detection and/or diagnosis of SARS-CoV-2 by FDA under an Emergency Use Authorization (EUA). This EUA will remain in effect  (meaning this test can be used) for the duration of the COVID-19 declaration under Section 564(b)(1) of the Act, 21 U.S.C. section 360bbb-3(b)(1), unless the authorization is terminated or revoked.  Performed at [facility], [place] [address] [place], [place] [address]   Culture, Urine     Status: Abnormal   Collection Time: 10/21/20  1:11 PM   Specimen: Urine, Catheterized  Result Value Ref Range Status   Specimen Description   Final    URINE, CATHETERIZED Performed at [facility] [address], [place], [place] [address]    Special Requests   Final    NONE Performed at [facility], [place] [address], [place], [place] [address]    Culture >=100,000 COLONIES/mL ESCHERICHIA COLI (A)  Final   Report Status 10/24/2020 FINAL  Final   Organism ID, Bacteria ESCHERICHIA COLI (A)  Final      Susceptibility   Escherichia coli - MIC*    AMPICILLIN >=32 RESISTANT Resistant     CEFAZOLIN >=64 RESISTANT Resistant     CEFEPIME <=0.12 SENSITIVE Sensitive     CEFTRIAXONE 0.5 SENSITIVE Sensitive     CIPROFLOXACIN <=0.25 SENSITIVE Sensitive     GENTAMICIN <=1 SENSITIVE Sensitive     IMIPENEM <=0.25 SENSITIVE Sensitive     NITROFURANTOIN <=16 SENSITIVE Sensitive     TRIMETH/SULFA <=20 SENSITIVE Sensitive     AMPICILLIN/SULBACTAM >=32 RESISTANT Resistant     PIP/TAZO <=4 SENSITIVE Sensitive     * >=100,000 COLONIES/mL ESCHERICHIA COLI     Labs: BNP (last 3 results) No results for input(s): BNP in the last 8760 hours. Basic Metabolic Panel: Recent Labs  Lab 10/21/20 0808 10/21/20 1350 10/22/20 0146 10/22/20 0901 10/23/20 0433  NA 122* 121* 130* 137 134*  K 3.4* 3.3* 3.6 3.6 3.8  CL 88* 89* 96* 104 105  CO2 25 25 25 25 22   GLUCOSE 140* 144* 117* 177* 103*  BUN 14 12 7* 6* 7*  CREATININE 0.56 0.52 0.61 0.62 0.58  CALCIUM 8.7* 7.8* 8.4* 8.4* 8.1*  MG 1.9  --   --   --  2.0  PHOS 1.6* 1.8* 3.0 2.3*  --    Liver Function  Tests: Recent Labs  Lab 10/19/20 2135 10/21/20 0808 10/21/20 1350 10/22/20 0146 10/22/20 0901  AST 26 48*  --   --   --   ALT 22 28  --   --   --  ALKPHOS 51 45  --   --   --   BILITOT 1.3* 1.4*  --   --   --   PROT 6.9 6.4*  --   --   --   ALBUMIN 4.6 4.5 3.9 4.1 3.8   Recent Labs  Lab 10/19/20 2135 10/21/20 0808  LIPASE 25 27   No results for input(s): AMMONIA in the last 168 hours. CBC: Recent Labs  Lab 10/21/20 0808 10/22/20 0146 10/23/20 0433  WBC 14.8* 15.3* 8.8  NEUTROABS 7.7  --   --   HGB 14.9 15.1* 13.0  HCT 42.2 44.4 39.1  MCV 86.3 89.2 91.4  PLT 248 262 225   Cardiac Enzymes: No results for input(s): CKTOTAL, CKMB, CKMBINDEX, TROPONINI in the last 168 hours. BNP: Invalid input(s): POCBNP CBG: No results for input(s): GLUCAP in the last 168 hours. D-Dimer No results for input(s): DDIMER in the last 72 hours. Hgb A1c No results for input(s): HGBA1C in the last 72 hours. Lipid Profile No results for input(s): CHOL, HDL, LDLCALC, TRIG, CHOLHDL, LDLDIRECT in the last 72 hours. Thyroid function studies No results for input(s): TSH, T4TOTAL, T3FREE, THYROIDAB in the last 72 hours.  Invalid input(s): FREET3 Anemia work up No results for input(s): VITAMINB12, FOLATE, FERRITIN, TIBC, IRON, RETICCTPCT in the last 72 hours. Urinalysis    Component Value Date/Time   COLORURINE YELLOW 10/21/2020 1311   APPEARANCEUR HAZY (A) 10/21/2020 1311   LABSPEC 1.015 10/21/2020 1311   PHURINE 6.0 10/21/2020 1311   GLUCOSEU NEGATIVE 10/21/2020 1311   HGBUR SMALL (A) 10/21/2020 1311   BILIRUBINUR NEGATIVE 10/21/2020 1311   KETONESUR 20 (A) 10/21/2020 1311   PROTEINUR 30 (A) 10/21/2020 1311   UROBILINOGEN 0.2 04/23/2014 1635   NITRITE NEGATIVE 10/21/2020 1311   LEUKOCYTESUR LARGE (A) 10/21/2020 1311   Sepsis Labs Invalid input(s): PROCALCITONIN,  WBC,  LACTICIDVEN Microbiology Recent Results (from the past 240 hour(s))  Urine culture     Status: Abnormal    Collection Time: 10/19/20  1:20 AM   Specimen: Urine, Catheterized  Result Value Ref Range Status   Specimen Description   Final    URINE, CATHETERIZED Performed at [facility], [place] [address] [place], [place] [address]    Special Requests   Final    NONE Performed at [facility], [place] [address], [place], [place] [address]    Culture >=100,000 COLONIES/mL ESCHERICHIA COLI (A)  Final   Report Status 10/22/2020 FINAL  Final   Organism ID, Bacteria ESCHERICHIA COLI (A)  Final      Susceptibility   Escherichia coli - MIC*    AMPICILLIN >=32 RESISTANT Resistant     CEFAZOLIN >=64 RESISTANT Resistant     CEFEPIME <=0.12 SENSITIVE Sensitive     CEFTRIAXONE 0.5 SENSITIVE Sensitive     CIPROFLOXACIN <=0.25 SENSITIVE Sensitive     GENTAMICIN <=1 SENSITIVE Sensitive     IMIPENEM <=0.25 SENSITIVE Sensitive     NITROFURANTOIN <=16 SENSITIVE Sensitive     TRIMETH/SULFA <=20 SENSITIVE Sensitive     AMPICILLIN/SULBACTAM >=32 RESISTANT Resistant     PIP/TAZO <=4 SENSITIVE Sensitive     * >=100,000 COLONIES/mL ESCHERICHIA COLI  Resp Panel by RT-PCR (Flu A&B, Covid) Nasopharyngeal Swab     Status: None   Collection Time: 10/21/20  9:48 AM   Specimen: Nasopharyngeal Swab; Nasopharyngeal(NP) swabs in vial transport medium  Result Value Ref Range Status   SARS Coronavirus 2 by RT PCR NEGATIVE NEGATIVE Final  Comment: (NOTE) SARS-CoV-2 target nucleic acids are NOT DETECTED.  The SARS-CoV-2 RNA is generally detectable in upper respiratory specimens during the acute phase of infection. The lowest concentration of SARS-CoV-2 viral copies this assay can detect is 138 copies/mL. A negative result does not preclude SARS-Cov-2 infection and should not be used as the sole basis for treatment or other patient management decisions. A negative result may occur with  improper specimen collection/handling, submission of specimen other than nasopharyngeal swab,  presence of viral mutation(s) within the areas targeted by this assay, and inadequate number of viral copies(<138 copies/mL). A negative result must be combined with clinical observations, patient history, and epidemiological information. The expected result is Negative.  Fact Sheet for Patients:  EntrepreneurPulse.com.au  Fact Sheet for Healthcare Providers:  IncredibleEmployment.be  This test is no t yet approved or cleared by the [place] FDA and  has been authorized for detection and/or diagnosis of SARS-CoV-2 by FDA under an Emergency Use Authorization (EUA). This EUA will remain  in effect (meaning this test can be used) for the duration of the COVID-19 declaration under Section 564(b)(1) of the Act, 21 U.S.C.section 360bbb-3(b)(1), unless the authorization is terminated  or revoked sooner.       Influenza A by PCR NEGATIVE NEGATIVE Final   Influenza B by PCR NEGATIVE NEGATIVE Final    Comment: (NOTE) The Xpert Xpress SARS-CoV-2/FLU/RSV plus assay is intended as an aid in the diagnosis of influenza from Nasopharyngeal swab specimens and should not be used as a sole basis for treatment. Nasal washings and aspirates are unacceptable for Xpert Xpress SARS-CoV-2/FLU/RSV testing.  Fact Sheet for Patients: EntrepreneurPulse.com.au  Fact Sheet for Healthcare Providers: IncredibleEmployment.be  This test is not yet approved or cleared by the [place] FDA and has been authorized for detection and/or diagnosis of SARS-CoV-2 by FDA under an Emergency Use Authorization (EUA). This EUA will remain in effect (meaning this test can be used) for the duration of the COVID-19 declaration under Section 564(b)(1) of the Act, 21 U.S.C. section 360bbb-3(b)(1), unless the authorization is terminated or revoked.  Performed at [facility], [place] [address] [place], [place] [address]    Culture, Urine     Status: Abnormal   Collection Time: 10/21/20  1:11 PM   Specimen: Urine, Catheterized  Result Value Ref Range Status   Specimen Description   Final    URINE, CATHETERIZED Performed at [facility] [address], [place], [place] [address]    Special Requests   Final    NONE Performed at [facility], [place] [address] [place], [place] [address]    Culture >=100,000 COLONIES/mL ESCHERICHIA COLI (A)  Final   Report Status 10/24/2020 FINAL  Final   Organism ID, Bacteria ESCHERICHIA COLI (A)  Final      Susceptibility   Escherichia coli - MIC*    AMPICILLIN >=32 RESISTANT Resistant     CEFAZOLIN >=64 RESISTANT Resistant     CEFEPIME <=0.12 SENSITIVE Sensitive     CEFTRIAXONE 0.5 SENSITIVE Sensitive     CIPROFLOXACIN <=0.25 SENSITIVE Sensitive     GENTAMICIN <=1 SENSITIVE Sensitive     IMIPENEM <=0.25 SENSITIVE Sensitive     NITROFURANTOIN <=16 SENSITIVE Sensitive     TRIMETH/SULFA <=20 SENSITIVE Sensitive     AMPICILLIN/SULBACTAM >=32 RESISTANT Resistant     PIP/TAZO <=4 SENSITIVE Sensitive     * >=100,000 COLONIES/mL ESCHERICHIA COLI     Time coordinating discharge: 32 minutes.  SIGNED:  Hosie Poisson, MD  [facility]

## 2020-11-13 DIAGNOSIS — N3000 Acute cystitis without hematuria: Secondary | ICD-10-CM | POA: Diagnosis not present

## 2020-11-13 DIAGNOSIS — N3946 Mixed incontinence: Secondary | ICD-10-CM | POA: Diagnosis not present

## 2020-11-22 DIAGNOSIS — K219 Gastro-esophageal reflux disease without esophagitis: Secondary | ICD-10-CM | POA: Diagnosis not present

## 2020-11-22 DIAGNOSIS — B0223 Postherpetic polyneuropathy: Secondary | ICD-10-CM | POA: Diagnosis not present

## 2020-11-28 DIAGNOSIS — J449 Chronic obstructive pulmonary disease, unspecified: Secondary | ICD-10-CM | POA: Diagnosis not present

## 2020-11-28 DIAGNOSIS — E785 Hyperlipidemia, unspecified: Secondary | ICD-10-CM | POA: Diagnosis not present

## 2020-11-28 DIAGNOSIS — R7301 Impaired fasting glucose: Secondary | ICD-10-CM | POA: Diagnosis not present

## 2020-11-28 DIAGNOSIS — K219 Gastro-esophageal reflux disease without esophagitis: Secondary | ICD-10-CM | POA: Diagnosis not present

## 2020-11-28 DIAGNOSIS — F329 Major depressive disorder, single episode, unspecified: Secondary | ICD-10-CM | POA: Diagnosis not present

## 2020-12-02 DIAGNOSIS — C9100 Acute lymphoblastic leukemia not having achieved remission: Secondary | ICD-10-CM | POA: Diagnosis not present

## 2020-12-02 DIAGNOSIS — R059 Cough, unspecified: Secondary | ICD-10-CM | POA: Diagnosis not present

## 2020-12-02 DIAGNOSIS — J069 Acute upper respiratory infection, unspecified: Secondary | ICD-10-CM | POA: Diagnosis not present

## 2020-12-02 DIAGNOSIS — J449 Chronic obstructive pulmonary disease, unspecified: Secondary | ICD-10-CM | POA: Diagnosis not present

## 2020-12-06 DIAGNOSIS — H10413 Chronic giant papillary conjunctivitis, bilateral: Secondary | ICD-10-CM | POA: Diagnosis not present

## 2020-12-18 DIAGNOSIS — H10413 Chronic giant papillary conjunctivitis, bilateral: Secondary | ICD-10-CM | POA: Diagnosis not present

## 2021-01-28 DIAGNOSIS — E785 Hyperlipidemia, unspecified: Secondary | ICD-10-CM | POA: Diagnosis not present

## 2021-01-28 DIAGNOSIS — K219 Gastro-esophageal reflux disease without esophagitis: Secondary | ICD-10-CM | POA: Diagnosis not present

## 2021-01-28 DIAGNOSIS — J449 Chronic obstructive pulmonary disease, unspecified: Secondary | ICD-10-CM | POA: Diagnosis not present

## 2021-01-30 ENCOUNTER — Other Ambulatory Visit: Payer: Self-pay

## 2021-01-30 ENCOUNTER — Encounter: Payer: Self-pay | Admitting: Allergy & Immunology

## 2021-01-30 ENCOUNTER — Ambulatory Visit (INDEPENDENT_AMBULATORY_CARE_PROVIDER_SITE_OTHER): Payer: Medicare Other | Admitting: Allergy & Immunology

## 2021-01-30 VITALS — BP 120/82 | HR 98 | Temp 97.3°F | Resp 16 | Ht 66.0 in | Wt 174.4 lb

## 2021-01-30 DIAGNOSIS — H1045 Other chronic allergic conjunctivitis: Secondary | ICD-10-CM

## 2021-01-30 MED ORDER — OLOPATADINE HCL 0.2 % OP SOLN: 1.0000 [drp] | OPHTHALMIC | 5 refills | Status: DC

## 2021-01-30 MED ORDER — CETIRIZINE HCL 10 MG PO TABS: 10.0000 mg | ORAL_TABLET | Freq: Every day | ORAL | 5 refills | Status: DC

## 2021-01-30 NOTE — Patient Instructions (Addendum)
1. Chronic conjunctivitis of both eyes - Testing today was negative to the entire panel (both percutaneous and intradermal). - Copy of testing results provided today. - We are going to try an eye drop (Pataday one drop per eye twice daily as needed). - We are also going to start Zyrtec (cetirizine) 10 mg daily to see if this can help with your symptoms.  2. Return in about 6 months (around 08/01/2021) or earlier if needed.    Please inform us of any Emergency Department visits, hospitalizations, or changes in symptoms. Call us before going to the ED for breathing or allergy symptoms since we might be able to fit you in for a sick visit. Feel free to contact us anytime with any questions, problems, or concerns.  It was a pleasure to meet you today!  Websites that have reliable patient information: 1. American Academy of Asthma, Allergy, and Immunology: www.aaaai.org 2. Food Allergy Research and Education (FARE): foodallergy.org 3. Mothers of Asthmatics: http://www.asthmacommunitynetwork.org 4. American College of Allergy, Asthma, and Immunology: www.acaai.org   COVID-19 Vaccine Information can be found at: ShippingScam.co.uk For questions related to vaccine distribution or appointments, please email [email or web address] or call [phone].   We realize that you might be concerned about having an allergic reaction to the COVID19 vaccines. To help with that concern, WE ARE OFFERING THE COVID19 VACCINES IN OUR OFFICE! Ask the front desk for dates!     "Like" [place] on Facebook and Instagram for our latest updates!      A healthy democracy works best when [employer] participate! Make sure you are registered to vote! If you have moved or changed any of your contact information, you will need to get this updated before voting!  In some cases, you MAY be able to register to vote online:  [email or web address]    1. Control-Buffer 50% Glycerol Negative   2. Control-Histamine 1 mg/ml 2+   3. Albumin saline Negative   4. [place] Negative   5. [place] Negative   6. Johnson Negative   7. [place] Blue Negative   8. Meadow Fescue Negative   9. Perennial Rye Negative   10. Sweet Vernal Negative   11. Timothy Negative   12. Cocklebur Negative   13. Burweed Marshelder Negative   14. Ragweed, short Negative   15. Ragweed, Giant Negative   16. Plantain,  English Negative   17. Lamb's Quarters Negative   18. Sheep Sorrell Negative   19. Rough Pigweed Negative   20. Marsh Elder, Rough Negative   21. Mugwort, Common Negative   22. Ash mix Negative   23. Birch mix Negative   24. Beech American Negative   25. Box, Elder Negative   26. Cedar, red Negative   27. Cottonwood, [place] Negative   28. Elm mix Negative   29. Hickory Negative   30. Maple mix Negative   31. Oak, [place] mix Negative   32. Pecan Pollen Negative   33. Pine mix Negative   34. Sycamore Eastern Negative   35. [place], Black Pollen Negative   36. Alternaria alternata Negative   37. Cladosporium Herbarum Negative   38. Aspergillus mix Negative   39. Penicillium mix Negative   40. Bipolaris sorokiniana (Helminthosporium) Negative   41. Drechslera spicifera (Curvularia) Negative   42. Mucor plumbeus Negative   43. Fusarium moniliforme Negative   44. Aureobasidium pullulans (pullulara) Negative   45. Rhizopus oryzae Negative   46. Botrytis cinera Negative   47. Epicoccum nigrum Negative   48. Phoma  betae Negative   49. Candida Albicans Negative   50. Trichophyton mentagrophytes Negative   51. Mite, D Farinae  5,000 AU/ml Negative   52. Mite, D Pteronyssinus  5,000 AU/ml Negative   53. Cat Hair 10,000 BAU/ml Negative   54.  Dog Epithelia Negative   55. Mixed Feathers Negative   56. Horse Epithelia Negative   57. Cockroach, German Negative   58. Mouse Negative   59.  Tobacco Leaf Negative      Control Negative   [place] Negative   Johnson Negative   7 Grass Negative   Ragweed mix Negative   Weed mix Negative   Tree mix Negative   Mold 1 Negative   Mold 2 Negative   Mold 3 Negative   Mold 4 Negative   Cat Negative   Dog Negative   Cockroach Negative   Mite mix Negative   Other Negative

## 2021-01-30 NOTE — Progress Notes (Signed)
NEW PATIENT  Date of Service/Encounter:  01/30/21  Consult requested by: Brenda Infante, MD   Assessment:   Chronic non-allergic conjunctivitis of both eyes  Plan/Recommendations:   1. Chronic conjunctivitis of both eyes - Testing today was negative to the entire panel (both percutaneous and intradermal). - Copy of testing results provided today. - We are going to try an eye drop (Pataday one drop per eye twice daily as needed). - We are also going to start Zyrtec (cetirizine) 10 mg daily to see if this can help with your symptoms.  2. Return in about 6 months (around 08/01/2021) or earlier if needed.    This note in its entirety was forwarded to the Provider who requested this consultation.  Subjective:   Brenda [name] is a 87 y.o. female presenting today for evaluation of  Chief Complaint  Patient presents with  . Allergic Rhinitis     Has issues with indoor allergies. Runny nose and watery eyes.    Brenda [name] has a history of the following: Patient Active Problem List   Diagnosis Date Noted  . Hyponatremia 10/21/2020  . Abnormal echocardiogram 06/15/2019  . Dyspnea 07/29/2016  . Left shoulder pain 07/29/2016  . Generalized anxiety disorder 09/16/2015  . Memory loss 06/02/2013    History obtained from: chart review and patient.  Brenda [name] was referred by Brenda Infante, MD.     Brenda [name] is a 87 y.o. female presenting for an evaluation of possible allergic conjnuctivitis.  She has been having these allergic issues for a long period of time. This is mostly concentrated on ocular itchiness. She is also having them more when she is in her bathroom. She has been wiping the rug and the tile and cleaning intensely but this is not helping. She does have sneezing but she cannot say that she sneezes at each particular time. She has been sneezing a lot this time. These symptoms may persist in the winter but she is not sure. She denies any triggering foods that make her  symptoms worse. She has a dog and they have always had a dog.    She doe have rhinorrhea that she has noticed more recently. She denies any sinus infections. She has been coughing and she had a CXR that was negative. She has never been diagnosed with asthma. She is not a smoker at all. Cough is getting better over time.   She has no tried anything for allergies specifically. She does use Restasis for her eyes. "She has had eye surgeries for eye lids. She does not think that she has tried cetirizine or other antihistamines. She has never had allergy testing done.  She has seen an ophthalmologist, but she cannot remember his name exactly.  She does have some dry skin. Over the past few months, she has had shingles. She had this diagnosed in February 2022. She was hospitalized initially for dehydration and was noted to have shingles as an aside. She has had the shingles vaccine.   Otherwise, there is no history of other atopic diseases, including asthma, food allergies, drug allergies, stinging insect allergies, eczema, urticaria or contact dermatitis. There is no significant infectious history. Vaccinations are up to date.    Past Medical History: Patient Active Problem List   Diagnosis Date Noted  . Hyponatremia 10/21/2020  . Abnormal echocardiogram 06/15/2019  . Dyspnea 07/29/2016  . Left shoulder pain 07/29/2016  . Generalized anxiety disorder 09/16/2015  . Memory loss 06/02/2013    Medication  List:  Allergies as of 01/30/2021      Reactions   Azithromycin    Adhesive [tape] Rash   Codeine Nausea And Vomiting   Doxycycline Rash   Severe Reflux , cheek "flushing"   Sulfa Antibiotics Rash      Medication List       Accurate as of January 30, 2021  1:16 PM. If you have any questions, ask your nurse or doctor.        acetaminophen 500 MG tablet Commonly known as: TYLENOL Take 500 mg by mouth every 6 (six) hours as needed for moderate pain.   atorvastatin 20 MG tablet Commonly  known as: LIPITOR Take 20 mg by mouth daily.   cetirizine 10 MG tablet Commonly known as: ZYRTEC Take 1 tablet (10 mg total) by mouth daily. Started by: Brenda Shaggy, MD   cycloSPORINE 0.05 % ophthalmic emulsion Commonly known as: RESTASIS Place 1 drop into both eyes 2 (two) times daily.   desvenlafaxine 50 MG 24 hr tablet Commonly known as: PRISTIQ Take 50 mg by mouth daily.   estradiol 2 MG vaginal ring Commonly known as: ESTRING Place 2 mg vaginally every 3 (three) months. follow package directions   gabapentin 100 MG capsule Commonly known as: NEURONTIN Take 2 capsules (200 mg total) by mouth 3 (three) times daily for 7 days.   LORazepam 0.5 MG tablet Commonly known as: ATIVAN Take 0.5 mg by mouth at bedtime.   metoCLOPramide 10 MG tablet Commonly known as: Reglan Take 1 tablet (10 mg total) by mouth every 6 (six) hours as needed for nausea or vomiting.   multivitamin capsule Take 1 capsule by mouth daily.   Myrbetriq 50 MG Tb24 tablet Generic drug: mirabegron ER Take 50 mg by mouth daily.   Olopatadine HCl 0.2 % Soln Commonly known as: Pataday Place 1 drop into both eyes 1 day or 1 dose. Started by: Brenda Shaggy, MD   ondansetron 8 MG disintegrating tablet Commonly known as: ZOFRAN-ODT Take 8 mg by mouth every 8 (eight) hours as needed for nausea or vomiting.   pantoprazole 40 MG tablet Commonly known as: PROTONIX Take 40 mg by mouth daily.   PRESERVISION AREDS 2+MULTI VIT PO Take 1 capsule by mouth daily.   solifenacin 5 MG tablet Commonly known as: VESICARE Take 5 mg by mouth daily.   Zetia 10 MG tablet Generic drug: ezetimibe Take 10 mg by mouth at bedtime.   zolpidem 5 MG tablet Commonly known as: AMBIEN Take 5 mg by mouth at bedtime.       Birth History: non-contributory  Developmental History: non-contributory  Past Surgical History: Past Surgical History:  Procedure Laterality Date  . ABDOMINAL HYSTERECTOMY    .  APPENDECTOMY    . BREAST EXCISIONAL BIOPSY Left   . CARDIAC EVENT MONITOR  02/03/2005   30 days, normal event monitor  . CATARACT EXTRACTION Bilateral   . EYE SURGERY     squamous cell carcinoma surgery in right eye lid.  Marland Kitchen KNEE ARTHROSCOPY Right 07/02/2016   Procedure: ARTHROSCOPY KNEE WITH DEBRIDEMENT, PARTIAL LATERAL AND MEDIAL MENISCECTOMY, with drainage of cyst;  Surgeon: Susa Day, MD;  Location: [facility];  Service: Orthopedics;  Laterality: Right;  . NM GATED MYOCARDIAL STUDY (ARMX HX)  01/23/2011   Normal pattern of perfusion in all regions. post-stress EF is 85%. EKG negative for ischemia. no ECG changes.  . TONSILLECTOMY    . TRANSTHORACIC ECHOCARDIOGRAM  10/31/2010   EF >55%, there is concern  for inferoacpical infarct with possible inferoapical aneurysm or pseudoaneurysm - could be echo artificat     Family History: Family History  Problem Relation Age of Onset  . Alzheimer's disease Father   . Dementia Father   . Atrial fibrillation Brother   . Dementia Maternal Grandmother   . Dementia Paternal Grandmother      Social History: Fathima lives at home with her husband, who is a retired Forensic psychologist.  He did fall for real estate as well as family investments. She worked as a Community education officer. 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 She worked at a nursing home for 25 years.  She lives in a townhome that is 85 years old.  There is carpeting throughout the townhome.  She has gas heating and central cooling.  There is a dog inside of the house.  There is a dust mite covers on the pillows, but not the bed.  There is no tobacco exposure.  They have 2 daughters, one of whom lives in [place] and another in [place], [place], [place].  They have 6 grandchildren.   Review of Systems  Constitutional: Negative.  Negative for fever, malaise/fatigue and weight loss.  HENT: Negative.  Negative for congestion, ear discharge and ear pain.   Eyes: Positive for discharge and  redness. Negative for pain.  Respiratory: Negative for cough, sputum production, shortness of breath and wheezing.   Cardiovascular: Negative.  Negative for chest pain and palpitations.  Gastrointestinal: Negative for abdominal pain and heartburn.  Skin: Negative.  Negative for itching and rash.  Neurological: Negative for dizziness and headaches.  Endo/Heme/Allergies: Negative for environmental allergies. Does not bruise/bleed easily.       Objective:   Blood pressure 120/82, pulse 98, temperature (!) 97.3 F (36.3 C), temperature source Temporal, resp. rate 16, height 5\' 6"  (1.676 m), weight 174 lb 6.4 oz (79.1 kg), SpO2 96 %. Body mass index is 28.15 kg/m.   Physical Exam:   Physical Exam Constitutional:      Appearance: She is well-developed.  HENT:     Head: Normocephalic and atraumatic.     Right Ear: Tympanic membrane, ear canal and external ear normal. No drainage, swelling or tenderness. Tympanic membrane is not injected, scarred, erythematous, retracted or bulging.     Left Ear: Tympanic membrane, ear canal and external ear normal. No drainage, swelling or tenderness. Tympanic membrane is not injected, scarred, erythematous, retracted or bulging.     Ears:     Comments: Periods in place bilaterally.    Nose: No nasal deformity, septal deviation, mucosal edema or rhinorrhea.     Right Sinus: No maxillary sinus tenderness or frontal sinus tenderness.     Left Sinus: No maxillary sinus tenderness or frontal sinus tenderness.     Mouth/Throat:     Mouth: Mucous membranes are not pale and not dry.     Pharynx: Uvula midline.  Eyes:     General:        Right eye: No discharge.        Left eye: No discharge.     Conjunctiva/sclera: Conjunctivae normal.     Right eye: Right conjunctiva is not injected. No chemosis.    Left eye: Left conjunctiva is not injected. No chemosis.    Pupils: Pupils are equal, round, and reactive to light.     Comments: Lids are enlarged  bilaterally. There is some clear discharge. No conjunctival injection. No dermatitis from what I can appreciate.   Cardiovascular:  Rate and Rhythm: Normal rate and regular rhythm.     Heart sounds: Normal heart sounds.  Pulmonary:     Effort: Pulmonary effort is normal. No tachypnea, accessory muscle usage or respiratory distress.     Breath sounds: Normal breath sounds. No wheezing, rhonchi or rales.     Comments: Moving air well in all lung fields.  No increased work of breathing. Chest:     Chest wall: No tenderness.  Abdominal:     Tenderness: There is no abdominal tenderness. There is no guarding or rebound.  Lymphadenopathy:     Head:     Right side of head: No submandibular, tonsillar or occipital adenopathy.     Left side of head: No submandibular, tonsillar or occipital adenopathy.     Cervical: No cervical adenopathy.  Skin:    General: Skin is warm.     Capillary Refill: Capillary refill takes less than 2 seconds.     Coloration: Skin is not pale.     Findings: No abrasion, erythema, petechiae or rash. Rash is not papular, urticarial or vesicular.     Comments: No eczematous or urticarial lesions noted.  Neurological:     Mental Status: She is alert.      Diagnostic studies:   Allergy Studies:     Airborne Adult Perc - 01/30/21 0916    Time Antigen Placed 0762    Allergen Manufacturer Lavella Hammock    Location Back    Number of Test 59    1. Control-Buffer 50% Glycerol Negative    2. Control-Histamine 1 mg/ml 2+    3. Albumin saline Negative    4. [place] Negative    5. [place] Negative    6. [place] Negative    7. [place] Blue Negative    8. Meadow Fescue Negative    9. Perennial Rye Negative    10. Sweet Vernal Negative    11. Timothy Negative    12. Cocklebur Negative    13. Burweed Marshelder Negative    14. Ragweed, short Negative    15. Ragweed, Giant Negative    16. Plantain,  English Negative    17. Lamb's Quarters Negative    18. Sheep Sorrell  Negative    19. Rough Pigweed Negative    20. Marsh Elder, Rough Negative    21. Mugwort, Common Negative    22. Ash mix Negative    23. Birch mix Negative    24. Beech American Negative    25. Box, Elder Negative    26. Cedar, red Negative    27. Cottonwood, [place] Negative    28. Elm mix Negative    29. Hickory Negative    30. Maple mix Negative    31. Oak, [place] mix Negative    32. Pecan Pollen Negative    33. Pine mix Negative    34. Sycamore Eastern Negative    35. [place], Black Pollen Negative    36. Alternaria alternata Negative    37. Cladosporium Herbarum Negative    38. Aspergillus mix Negative    39. Penicillium mix Negative    40. Bipolaris sorokiniana (Helminthosporium) Negative    41. Drechslera spicifera (Curvularia) Negative    42. Mucor plumbeus Negative    43. Fusarium moniliforme Negative    44. Aureobasidium pullulans (pullulara) Negative    45. Rhizopus oryzae Negative    46. Botrytis cinera Negative    47. Epicoccum nigrum Negative    48. Phoma betae Negative    49. Candida  Albicans Negative    50. Trichophyton mentagrophytes Negative    51. Mite, D Farinae  5,000 AU/ml Negative    52. Mite, D Pteronyssinus  5,000 AU/ml Negative    53. Cat Hair 10,000 BAU/ml Negative    54.  Dog Epithelia Negative    55. Mixed Feathers Negative    56. Horse Epithelia Negative    57. Cockroach, German Negative    58. Mouse Negative    59. Tobacco Leaf Negative          Intradermal - 01/30/21 0947    Time Antigen Placed 2481    Allergen Manufacturer Lavella Hammock    Location Arm    Number of Test 15    Control Negative    [place] Negative    Johnson Negative    7 Grass Negative    Ragweed mix Negative    Weed mix Negative    Tree mix Negative    Mold 1 Negative    Mold 2 Negative    Mold 3 Negative    Mold 4 Negative    Cat Negative    Dog Negative    Cockroach Negative    Mite mix Negative    Other Negative           Allergy testing results were  read and interpreted by myself, documented by clinical staff.         Salvatore Marvel, MD [facility]

## 2021-02-06 DIAGNOSIS — H10413 Chronic giant papillary conjunctivitis, bilateral: Secondary | ICD-10-CM | POA: Diagnosis not present

## 2021-02-07 DIAGNOSIS — Z0000 Encounter for general adult medical examination without abnormal findings: Secondary | ICD-10-CM | POA: Diagnosis not present

## 2021-02-07 DIAGNOSIS — E785 Hyperlipidemia, unspecified: Secondary | ICD-10-CM | POA: Diagnosis not present

## 2021-02-07 DIAGNOSIS — M8589 Other specified disorders of bone density and structure, multiple sites: Secondary | ICD-10-CM | POA: Diagnosis not present

## 2021-02-07 DIAGNOSIS — R7301 Impaired fasting glucose: Secondary | ICD-10-CM | POA: Diagnosis not present

## 2021-02-07 DIAGNOSIS — M859 Disorder of bone density and structure, unspecified: Secondary | ICD-10-CM | POA: Diagnosis not present

## 2021-02-11 DIAGNOSIS — L570 Actinic keratosis: Secondary | ICD-10-CM | POA: Diagnosis not present

## 2021-02-11 DIAGNOSIS — Z85828 Personal history of other malignant neoplasm of skin: Secondary | ICD-10-CM | POA: Diagnosis not present

## 2021-02-11 DIAGNOSIS — D1801 Hemangioma of skin and subcutaneous tissue: Secondary | ICD-10-CM | POA: Diagnosis not present

## 2021-02-11 DIAGNOSIS — L738 Other specified follicular disorders: Secondary | ICD-10-CM | POA: Diagnosis not present

## 2021-02-11 DIAGNOSIS — D692 Other nonthrombocytopenic purpura: Secondary | ICD-10-CM | POA: Diagnosis not present

## 2021-02-11 DIAGNOSIS — L821 Other seborrheic keratosis: Secondary | ICD-10-CM | POA: Diagnosis not present

## 2021-02-14 DIAGNOSIS — Z0000 Encounter for general adult medical examination without abnormal findings: Secondary | ICD-10-CM | POA: Diagnosis not present

## 2021-02-14 DIAGNOSIS — C9100 Acute lymphoblastic leukemia not having achieved remission: Secondary | ICD-10-CM | POA: Diagnosis not present

## 2021-02-14 DIAGNOSIS — R82998 Other abnormal findings in urine: Secondary | ICD-10-CM | POA: Diagnosis not present

## 2021-02-14 DIAGNOSIS — R32 Unspecified urinary incontinence: Secondary | ICD-10-CM | POA: Diagnosis not present

## 2021-02-14 DIAGNOSIS — K1379 Other lesions of oral mucosa: Secondary | ICD-10-CM | POA: Diagnosis not present

## 2021-02-14 DIAGNOSIS — R5383 Other fatigue: Secondary | ICD-10-CM | POA: Diagnosis not present

## 2021-02-14 DIAGNOSIS — E785 Hyperlipidemia, unspecified: Secondary | ICD-10-CM | POA: Diagnosis not present

## 2021-02-14 DIAGNOSIS — K146 Glossodynia: Secondary | ICD-10-CM | POA: Diagnosis not present

## 2021-02-14 DIAGNOSIS — M8589 Other specified disorders of bone density and structure, multiple sites: Secondary | ICD-10-CM | POA: Diagnosis not present

## 2021-02-14 DIAGNOSIS — M25569 Pain in unspecified knee: Secondary | ICD-10-CM | POA: Diagnosis not present

## 2021-02-14 DIAGNOSIS — R011 Cardiac murmur, unspecified: Secondary | ICD-10-CM | POA: Diagnosis not present

## 2021-02-14 DIAGNOSIS — R7301 Impaired fasting glucose: Secondary | ICD-10-CM | POA: Diagnosis not present

## 2021-02-18 DIAGNOSIS — Z23 Encounter for immunization: Secondary | ICD-10-CM | POA: Diagnosis not present

## 2021-02-27 DIAGNOSIS — H04123 Dry eye syndrome of bilateral lacrimal glands: Secondary | ICD-10-CM | POA: Diagnosis not present

## 2021-03-05 DIAGNOSIS — B370 Candidal stomatitis: Secondary | ICD-10-CM | POA: Diagnosis not present

## 2021-03-05 DIAGNOSIS — Z1152 Encounter for screening for COVID-19: Secondary | ICD-10-CM | POA: Diagnosis not present

## 2021-03-05 DIAGNOSIS — C9100 Acute lymphoblastic leukemia not having achieved remission: Secondary | ICD-10-CM | POA: Diagnosis not present

## 2021-03-14 DIAGNOSIS — J209 Acute bronchitis, unspecified: Secondary | ICD-10-CM | POA: Diagnosis not present

## 2021-03-14 DIAGNOSIS — K219 Gastro-esophageal reflux disease without esophagitis: Secondary | ICD-10-CM | POA: Diagnosis not present

## 2021-03-14 DIAGNOSIS — Z1152 Encounter for screening for COVID-19: Secondary | ICD-10-CM | POA: Diagnosis not present

## 2021-03-14 DIAGNOSIS — C9100 Acute lymphoblastic leukemia not having achieved remission: Secondary | ICD-10-CM | POA: Diagnosis not present

## 2021-03-14 DIAGNOSIS — R0989 Other specified symptoms and signs involving the circulatory and respiratory systems: Secondary | ICD-10-CM | POA: Diagnosis not present

## 2021-03-14 DIAGNOSIS — J449 Chronic obstructive pulmonary disease, unspecified: Secondary | ICD-10-CM | POA: Diagnosis not present

## 2021-03-18 DIAGNOSIS — J449 Chronic obstructive pulmonary disease, unspecified: Secondary | ICD-10-CM | POA: Diagnosis not present

## 2021-03-18 DIAGNOSIS — C9100 Acute lymphoblastic leukemia not having achieved remission: Secondary | ICD-10-CM | POA: Diagnosis not present

## 2021-03-18 DIAGNOSIS — K219 Gastro-esophageal reflux disease without esophagitis: Secondary | ICD-10-CM | POA: Diagnosis not present

## 2021-03-18 DIAGNOSIS — K1370 Unspecified lesions of oral mucosa: Secondary | ICD-10-CM | POA: Diagnosis not present

## 2021-03-18 DIAGNOSIS — R040 Epistaxis: Secondary | ICD-10-CM | POA: Diagnosis not present

## 2021-03-18 DIAGNOSIS — J3481 Nasal mucositis (ulcerative): Secondary | ICD-10-CM | POA: Diagnosis not present

## 2021-03-19 IMAGING — DX CHEST - 2 VIEW
2 series · 2 of 2 positions shown · non-contrast
Comparison: 04/23/2014

CLINICAL DATA: Shortness of breath

EXAM:
CHEST - 2 VIEW

[chest pa]
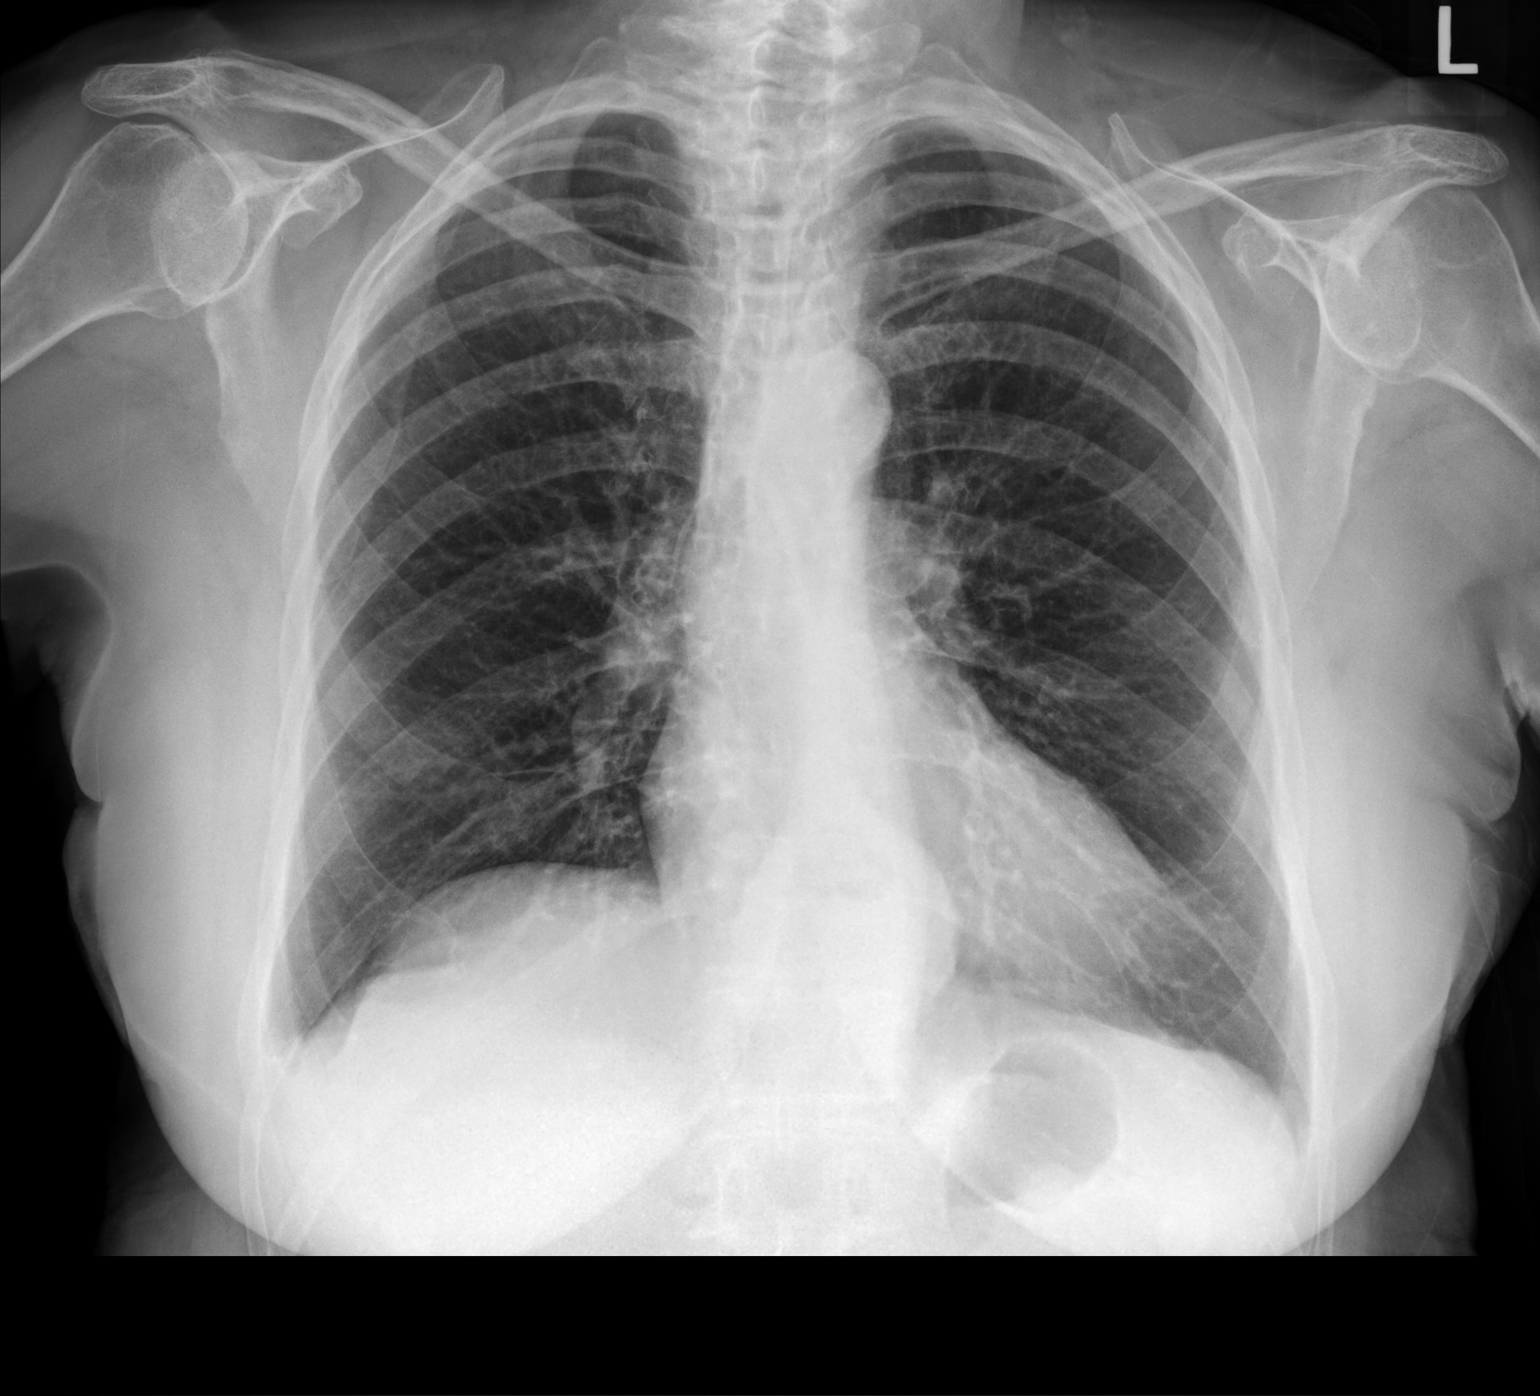

[chest lat]
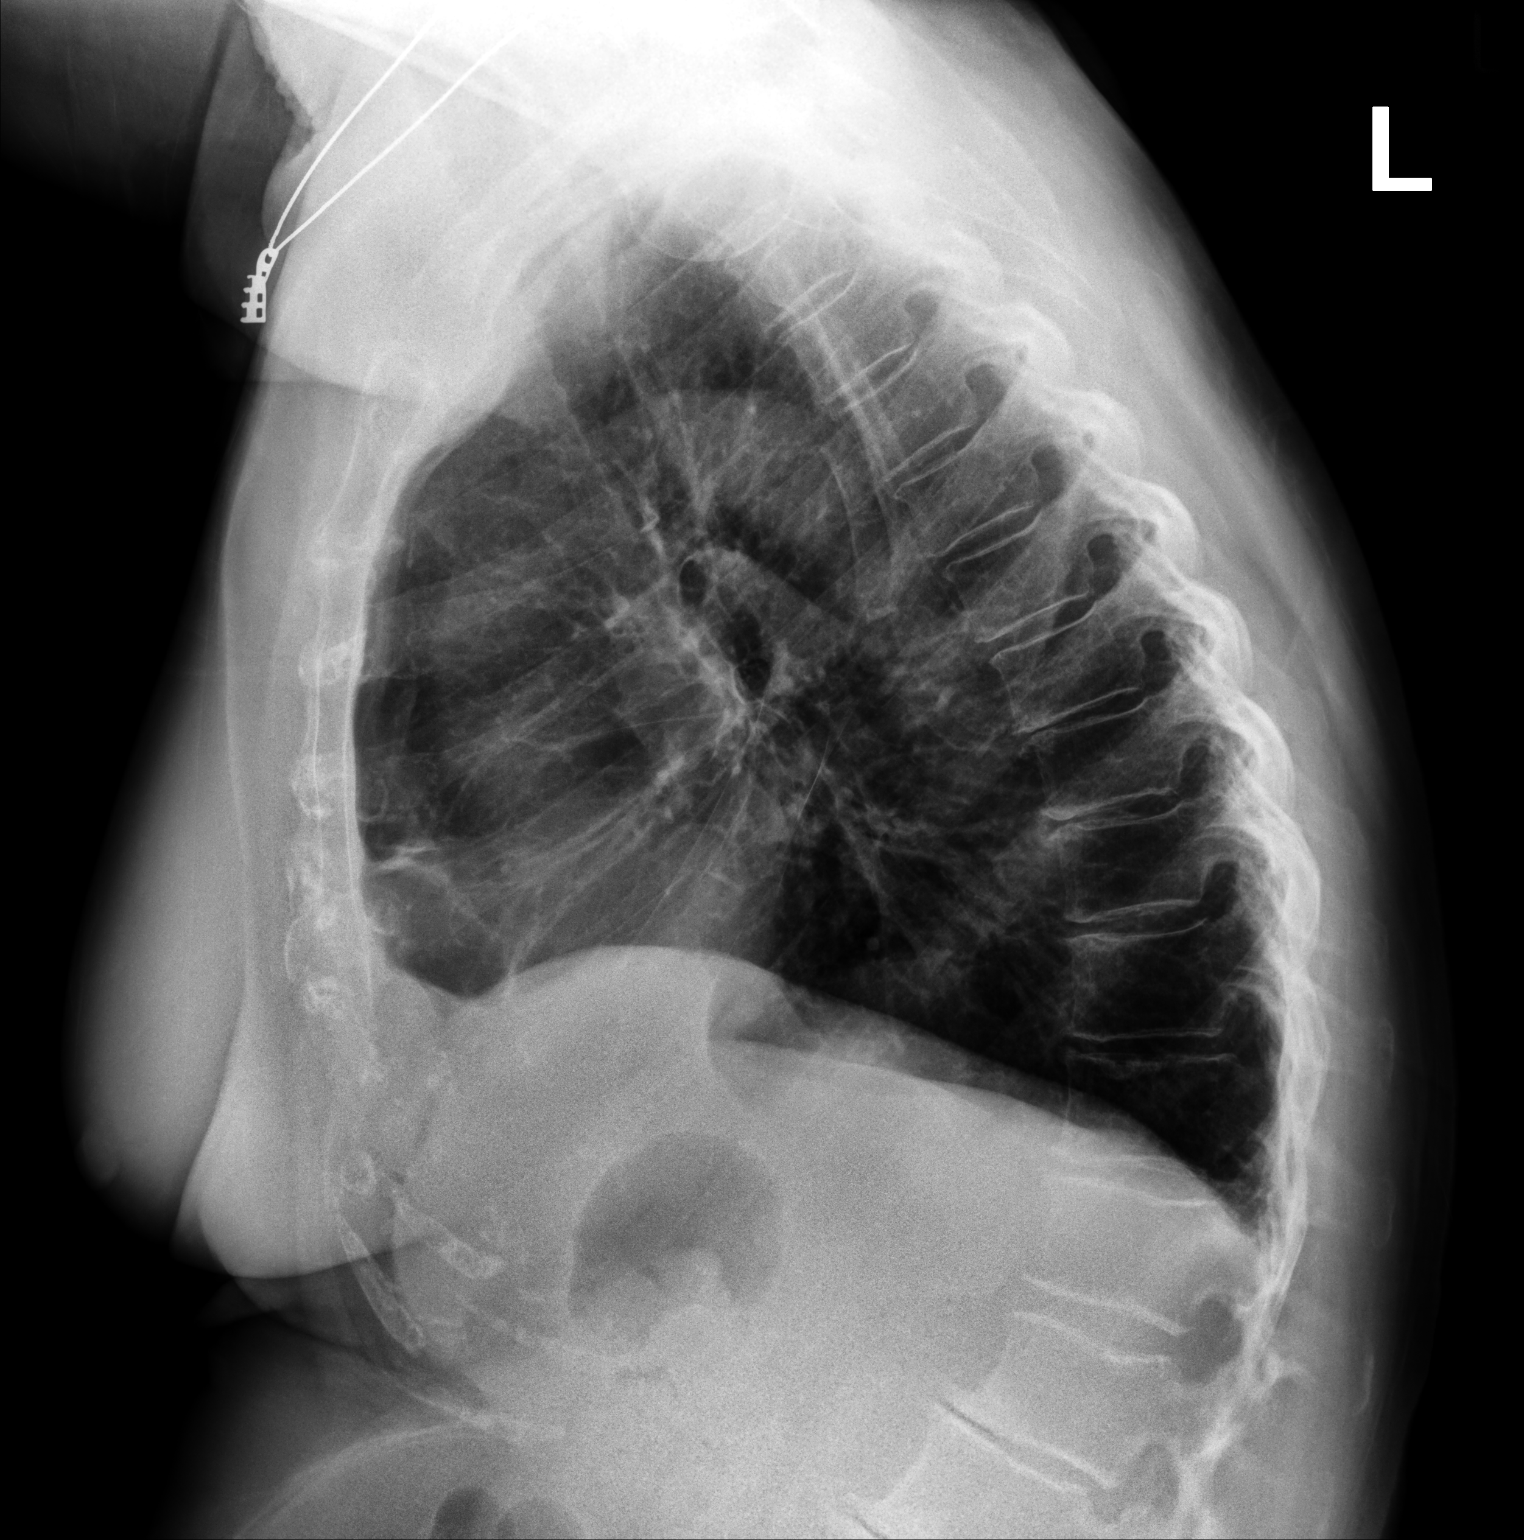

[2 of 2 positions shown; findings below may reference images not displayed]

FINDINGS: Cardiac shadow is within normal limits. Small hiatal hernia is
noted. The lungs are well aerated bilaterally without focal
infiltrate or sizable effusion. No acute bony abnormality is seen.
IMPRESSION: Small hiatal hernia.  No acute abnormality noted.

## 2021-03-24 ENCOUNTER — Telehealth: Payer: Self-pay | Admitting: Hematology

## 2021-03-24 NOTE — Telephone Encounter (Signed)
Scheduled appt per referral. Pt aware.  

## 2021-03-25 NOTE — Progress Notes (Signed)
HEMATOLOGY/ONCOLOGY CONSULTATION NOTE  Date of Service: 03/25/2021  Patient Care Team: Crist Infante, MD as PCP - General (Internal Medicine)  CHIEF COMPLAINTS/PURPOSE OF CONSULTATION:  leucocytosis  HISTORY OF PRESENTING ILLNESS:   Brenda [name] is a wonderful 87 y.o. female who has been referred to [place] by Minerva Areola Drinkard FNP-CS for evaluation and management of possible Leukemia  The pt reports that she is doing well overall.  The pt reports that she does not quite know why she was sent to be seen by hematology. She notes that she  Never had CLL Never had acute leukemia. Mother and brother had CLL  On review of systems, pt reports  Sore on lip, yeast infection of mouth-she has been doing nystatin mouthwash, notes mild epistaxis. Notes history of significant second hand smoke -- personal non smoker. Shingles in feb 2022 Gastroenteritis feb 2022  Patient had labs with her primary care physician which showed on 02/07/2021 WBC count of 10.9 with a lymphocyte count of 6.1k neutrophils of 3.9k normal hemoglobin of 14.8 and a normal platelet count of 278k  She subsequently had repeat labs on 03/18/2021 which showed a WBC count of 17.7k with a lymphocyte count of 8k neutrophil count of 8.1l normal hemoglobin of 14.4 and platelets of 325k.  Patient is here for further evaluation of her lymphocytosis.  She notes no new lumps or bumps.  No abdominal pain or distention.  No acute new shortness of breath or dyspnea on exertion. No overt new constitutional symptoms.   MEDICAL HISTORY:  Past Medical History:  Diagnosis Date   Anxiety    Arthritis    CLL (chronic lymphocytic leukemia) (HCC)    Complication of anesthesia    Depression    Depression with anxiety    Diverticulosis    Dyspnea    GERD (gastroesophageal reflux disease)    Hearing loss    Memory loss    Mild cognitive impairment    Osteopenia    PONV (postoperative nausea and vomiting)    Reflux    SOB (shortness of  breath)     SURGICAL HISTORY: Past Surgical History:  Procedure Laterality Date   ABDOMINAL HYSTERECTOMY     APPENDECTOMY     BREAST EXCISIONAL BIOPSY Left    CARDIAC EVENT MONITOR  02/03/2005   30 days, normal event monitor   CATARACT EXTRACTION Bilateral    EYE SURGERY     squamous cell carcinoma surgery in right eye lid.   KNEE ARTHROSCOPY Right 07/02/2016   Procedure: ARTHROSCOPY KNEE WITH DEBRIDEMENT, PARTIAL LATERAL AND MEDIAL MENISCECTOMY, with drainage of cyst;  Surgeon: Susa Day, MD;  Location: [facility];  Service: Orthopedics;  Laterality: Right;   NM GATED MYOCARDIAL STUDY (ARMX HX)  01/23/2011   Normal pattern of perfusion in all regions. post-stress EF is 85%. EKG negative for ischemia. no ECG changes.   TONSILLECTOMY     TRANSTHORACIC ECHOCARDIOGRAM  10/31/2010   EF >55%, there is concern for inferoacpical infarct with possible inferoapical aneurysm or pseudoaneurysm - could be echo artificat    SOCIAL HISTORY: Social History   Socioeconomic History   Marital status: Married    Spouse name: [name]   Number of children: 2   Years of education: college   Highest education level: Not on file  Occupational History   Not on file  Tobacco Use   Smoking status: Never   Smokeless tobacco: Never  Vaping Use   Vaping Use: Never used  Substance and Sexual  Activity   Alcohol use: No   Drug use: No   Sexual activity: Not on file  Other Topics Concern   Not on file  Social History Narrative   Patient lives at home with husband [name].    Patient is retired.    Patient has a college education.    Patient has 2 children.    Patient drinks 2 cups of coffee daily.    Social Determinants of Health   Financial Resource Strain: Not on file  Food Insecurity: Not on file  Transportation Needs: Not on file  Physical Activity: Not on file  Stress: Not on file  Social Connections: Not on file  Intimate Partner Violence: Not on file    FAMILY HISTORY: Family  History  Problem Relation Age of Onset   Alzheimer's disease Father    Dementia Father    Atrial fibrillation Brother    Dementia Maternal Grandmother    Dementia Paternal Grandmother     ALLERGIES:  is allergic to azithromycin, adhesive [tape], codeine, doxycycline, and sulfa antibiotics.  MEDICATIONS:  Current Outpatient Medications  Medication Sig Dispense Refill   acetaminophen (TYLENOL) 500 MG tablet Take 500 mg by mouth every 6 (six) hours as needed for moderate pain. (Patient not taking: Reported on 01/30/2021)     atorvastatin (LIPITOR) 20 MG tablet Take 20 mg by mouth daily.     cetirizine (ZYRTEC) 10 MG tablet Take 1 tablet (10 mg total) by mouth daily. 30 tablet 5   cycloSPORINE (RESTASIS) 0.05 % ophthalmic emulsion Place 1 drop into both eyes 2 (two) times daily. (Patient not taking: Reported on 01/30/2021)     desvenlafaxine (PRISTIQ) 50 MG 24 hr tablet Take 50 mg by mouth daily. (Patient not taking: Reported on 01/30/2021)     estradiol (ESTRING) 2 MG vaginal ring Place 2 mg vaginally every 3 (three) months. follow package directions     gabapentin (NEURONTIN) 100 MG capsule Take 2 capsules (200 mg total) by mouth 3 (three) times daily for 7 days. 42 capsule 0   LORazepam (ATIVAN) 0.5 MG tablet Take 0.5 mg by mouth at bedtime.      metoCLOPramide (REGLAN) 10 MG tablet Take 1 tablet (10 mg total) by mouth every 6 (six) hours as needed for nausea or vomiting. (Patient not taking: Reported on 01/30/2021) 12 tablet 0   Multiple Vitamin (MULTIVITAMIN) capsule Take 1 capsule by mouth daily. (Patient not taking: Reported on 01/30/2021)     Multiple Vitamins-Minerals (PRESERVISION AREDS 2+MULTI VIT PO) Take 1 capsule by mouth daily. (Patient not taking: Reported on 01/30/2021)     MYRBETRIQ 50 MG TB24 tablet Take 50 mg by mouth daily.     Olopatadine HCl (PATADAY) 0.2 % SOLN Place 1 drop into both eyes 1 day or 1 dose. 2.5 mL 5   ondansetron (ZOFRAN-ODT) 8 MG disintegrating tablet Take 8 mg by  mouth every 8 (eight) hours as needed for nausea or vomiting. (Patient not taking: Reported on 01/30/2021)     pantoprazole (PROTONIX) 40 MG tablet Take 40 mg by mouth daily.     solifenacin (VESICARE) 5 MG tablet Take 5 mg by mouth daily. (Patient not taking: Reported on 01/30/2021)     ZETIA 10 MG tablet Take 10 mg by mouth at bedtime.  (Patient not taking: Reported on 01/30/2021)     zolpidem (AMBIEN) 5 MG tablet Take 5 mg by mouth at bedtime.  (Patient not taking: Reported on 01/30/2021)     No current facility-administered medications  for this visit.    REVIEW OF SYSTEMS:    10 Point review of Systems was done is negative except as noted above.  PHYSICAL EXAMINATION: ECOG PERFORMANCE STATUS: 1 - Symptomatic but completely ambulatory  .There were no vitals filed for this visit. There were no vitals filed for this visit. .There is no height or weight on file to calculate BMI.  NAD GENERAL:alert, in no acute distress and comfortable SKIN: no acute rashes, no significant lesions EYES: conjunctiva are pink and non-injected, sclera anicteric OROPHARYNX: MMM, no exudates, no oropharyngeal erythema or ulceration NECK: supple, no JVD LYMPH:  no palpable lymphadenopathy in the cervical, axillary or inguinal regions LUNGS: clear to auscultation b/l with normal respiratory effort HEART: regular rate & rhythm ABDOMEN:  normoactive bowel sounds , non tender, not distended. Extremity: no pedal edema PSYCH: alert & oriented x 3 with fluent speech NEURO: no focal motor/sensory deficits  LABORATORY DATA:  I have reviewed the data as listed  . CBC Latest Ref Rng & Units 03/26/2021 03/26/2021 10/23/2020  WBC 4.0 - 10.5 K/uL 16.8(H) - 8.8  Hemoglobin 12.0 - 15.0 g/dL 14.6 - 13.0  Hematocrit 34.0 - 46.6 % 43.9 43.4 39.1  Platelets 150 - 400 K/uL 361 - 225   .CBC    Component Value Date/Time   WBC 16.8 (H) 03/26/2021 1415   WBC 8.8 10/23/2020 0433   RBC 4.81 03/26/2021 1415   HGB 14.6 03/26/2021  1415   HGB 14.8 02/04/2010 1423   HCT 43.9 03/26/2021 1416   HCT 43.4 03/26/2021 1415   HCT 42.8 02/04/2010 1423   PLT 361 03/26/2021 1415   PLT 272 02/04/2010 1423   MCV 90.2 03/26/2021 1415   MCV 99.3 (A) 08/19/2013 1223   MCV 93.2 02/04/2010 1423   MCH 30.4 03/26/2021 1415   MCHC 33.6 03/26/2021 1415   RDW 13.2 03/26/2021 1415   RDW 13.4 02/04/2010 1423   LYMPHSABS 7.9 (H) 03/26/2021 1415   LYMPHSABS 7.6 (H) 02/04/2010 1423   MONOABS 0.7 03/26/2021 1415   MONOABS 0.5 02/04/2010 1423   EOSABS 0.1 03/26/2021 1415   EOSABS 0.1 02/04/2010 1423   BASOSABS 0.1 03/26/2021 1415   BASOSABS 0.0 02/04/2010 1423     . CMP Latest Ref Rng & Units 03/26/2021 10/23/2020 10/22/2020  Glucose 70 - 99 mg/dL 98 103(H) 177(H)  BUN 8 - 23 mg/dL 14 7(L) 6(L)  Creatinine 0.44 - 1.00 mg/dL 0.79 0.58 0.62  Sodium 135 - 145 mmol/L 138 134(L) 137  Potassium 3.5 - 5.1 mmol/L 4.3 3.8 3.6  Chloride 98 - 111 mmol/L 103 105 104  CO2 22 - 32 mmol/L '27 22 25  ' Calcium 8.9 - 10.3 mg/dL 9.7 8.1(L) 8.4(L)  Total Protein 6.5 - 8.1 g/dL 6.8 - -  Total Bilirubin 0.3 - 1.2 mg/dL 0.4 - -  Alkaline Phos 38 - 126 U/L 71 - -  AST 15 - 41 U/L 19 - -  ALT 0 - 44 U/L 17 - -     RADIOGRAPHIC STUDIES: I have personally reviewed the radiological images as listed and agreed with the findings in the report. No results found.  ASSESSMENT & PLAN:   87 year old female referred to [place] by Dr. Joylene Draft for evaluation of leukocytosis  #1 leukocytosis/lymphocytosis.  Patient also has some mild neutrophilia. No obvious lymphadenopathy or hepatosplenomegaly on clinical examination Rule out a possible chronic lymphoproliferative disorder such as CLL.  PLAN: -Patient has had labs done today which shows a WBC count of 16.8k with  7.9k lymphocytes -Peripheral blood flow cytometry has been sent and is currently pending -Patient does not have any overt constitutional symptoms or clinically apparent lymphadenopathy or  hepatosplenomegaly. -Her red blood counts and platelets are within normal limits. -LDH was within normal limits -The labs of been ordered and currently pending  . Orders Placed This Encounter  Procedures   CBC with Differential ([place] Only)    Standing Status:   Future    Number of Occurrences:   1    Standing Expiration Date:   03/26/2022   CMP ([place] only)    Standing Status:   Future    Number of Occurrences:   1    Standing Expiration Date:   03/26/2022   Lactate dehydrogenase    Standing Status:   Future    Number of Occurrences:   1    Standing Expiration Date:   03/26/2022   Vitamin B12    Standing Status:   Future    Number of Occurrences:   1    Standing Expiration Date:   03/26/2022   Folate RBC    Standing Status:   Future    Number of Occurrences:   1    Standing Expiration Date:   03/26/2022   Flow Cytometry, Peripheral Blood (Oncology)    Lymphocytosis ? Lymphoproliferative disorder.    Standing Status:   Future    Number of Occurrences:   1    Standing Expiration Date:   03/26/2022   Hepatitis C antibody    Standing Status:   Future    Number of Occurrences:   1    Standing Expiration Date:   03/26/2022   Multiple Myeloma Panel (SPEP&IFE w/QIG)    Standing Status:   Future    Number of Occurrences:   1    Standing Expiration Date:   03/26/2022   Kappa/lambda light chains    Standing Status:   Future    Number of Occurrences:   1    Standing Expiration Date:   03/26/2022    #2 . Past Medical History:  Diagnosis Date   Anxiety    Arthritis    CLL (chronic lymphocytic leukemia) (HCC)    Complication of anesthesia    Depression    Depression with anxiety    Diverticulosis    Dyspnea    GERD (gastroesophageal reflux disease)    Hearing loss    Memory loss    Mild cognitive impairment    Osteopenia    PONV (postoperative nausea and vomiting)    Reflux    SOB (shortness of breath)    -MX of chronic medical issues per PCP  # 3   Recurrent aphthous ulcers -Patient was recommended to use over-the-counter B complex empirically to help with this -We also talked about using salt and baking soda mouthwashes  FOLLOW UP: Labs today Phone visit with Dr Irene Limbo in 1 week   All of the patients questions were answered with apparent satisfaction. The patient knows to call the clinic with any problems, questions or concerns.  I spent 40 minutes counseling the patient face to face. The total time spent in the appointment was 60 minutes and more than 50% was on counseling and direct patient cares.    Sullivan Lone MD [place] AAHIVMS [facility] Hematology/Oncology Physician [facility]  (Office):       [phone] (Work cell):  [phone] (Fax):           [phone]  03/25/2021  9:22 PM   I, Reinaldo Raddle, am acting as scribe for Dr. Sullivan Lone, MD.   .I have reviewed the above documentation for accuracy and completeness, and I agree with the above. Brunetta Genera MD

## 2021-03-26 ENCOUNTER — Other Ambulatory Visit: Payer: Self-pay

## 2021-03-26 ENCOUNTER — Inpatient Hospital Stay: Payer: Medicare Other

## 2021-03-26 ENCOUNTER — Inpatient Hospital Stay: Payer: Medicare Other | Attending: Hematology | Admitting: Hematology

## 2021-03-26 VITALS — BP 145/81 | HR 89 | Temp 97.9°F | Resp 18 | Ht 66.0 in | Wt 174.1 lb

## 2021-03-26 DIAGNOSIS — D72820 Lymphocytosis (symptomatic): Secondary | ICD-10-CM | POA: Insufficient documentation

## 2021-03-26 DIAGNOSIS — F32A Depression, unspecified: Secondary | ICD-10-CM | POA: Diagnosis not present

## 2021-03-26 DIAGNOSIS — K1379 Other lesions of oral mucosa: Secondary | ICD-10-CM | POA: Diagnosis not present

## 2021-03-26 DIAGNOSIS — L989 Disorder of the skin and subcutaneous tissue, unspecified: Secondary | ICD-10-CM | POA: Diagnosis not present

## 2021-03-26 DIAGNOSIS — M1990 Unspecified osteoarthritis, unspecified site: Secondary | ICD-10-CM | POA: Insufficient documentation

## 2021-03-26 DIAGNOSIS — K219 Gastro-esophageal reflux disease without esophagitis: Secondary | ICD-10-CM | POA: Insufficient documentation

## 2021-03-26 DIAGNOSIS — B370 Candidal stomatitis: Secondary | ICD-10-CM | POA: Insufficient documentation

## 2021-03-26 DIAGNOSIS — E538 Deficiency of other specified B group vitamins: Secondary | ICD-10-CM

## 2021-03-26 DIAGNOSIS — F419 Anxiety disorder, unspecified: Secondary | ICD-10-CM | POA: Insufficient documentation

## 2021-03-26 DIAGNOSIS — Z79899 Other long term (current) drug therapy: Secondary | ICD-10-CM | POA: Diagnosis not present

## 2021-03-26 DIAGNOSIS — M8580 Other specified disorders of bone density and structure, unspecified site: Secondary | ICD-10-CM | POA: Insufficient documentation

## 2021-03-26 LAB — CMP (CANCER CENTER ONLY)
ALT: 17 U/L (ref 0–44)
AST: 19 U/L (ref 15–41)
Albumin: 4 g/dL (ref 3.5–5.0)
Alkaline Phosphatase: 71 U/L (ref 38–126)
Anion gap: 8 (ref 5–15)
BUN: 14 mg/dL (ref 8–23)
CO2: 27 mmol/L (ref 22–32)
Calcium: 9.7 mg/dL (ref 8.9–10.3)
Chloride: 103 mmol/L (ref 98–111)
Creatinine: 0.79 mg/dL (ref 0.44–1.00)
GFR, Estimated: >60 mL/min (ref >60)
Glucose, Bld: 98 mg/dL (ref 70–99)
Potassium: 4.3 mmol/L (ref 3.5–5.1)
Sodium: 138 mmol/L (ref 135–145)
Total Bilirubin: 0.4 mg/dL (ref 0.3–1.2)
Total Protein: 6.8 g/dL (ref 6.5–8.1)

## 2021-03-26 LAB — CBC WITH DIFFERENTIAL (CANCER CENTER ONLY)
Abs Immature Granulocytes: 0.07 10*3/uL (ref 0.00–0.07)
Basophils Absolute: 0.1 10*3/uL (ref 0.0–0.1)
Basophils Relative: 0 %
Eosinophils Absolute: 0.1 10*3/uL (ref 0.0–0.5)
Eosinophils Relative: 1 %
HCT: 43.4 % (ref 36.0–46.0)
Hemoglobin: 14.6 g/dL (ref 12.0–15.0)
Immature Granulocytes: 0 %
Lymphocytes Relative: 48 %
Lymphs Abs: 7.9 10*3/uL — ABNORMAL HIGH (ref 0.7–4.0)
MCH: 30.4 pg (ref 26.0–34.0)
MCHC: 33.6 g/dL (ref 30.0–36.0)
MCV: 90.2 fL (ref 80.0–100.0)
Monocytes Absolute: 0.7 10*3/uL (ref 0.1–1.0)
Monocytes Relative: 4 %
Neutro Abs: 7.9 10*3/uL — ABNORMAL HIGH (ref 1.7–7.7)
Neutrophils Relative %: 47 %
Platelet Count: 361 10*3/uL (ref 150–400)
RBC: 4.81 MIL/uL (ref 3.87–5.11)
RDW: 13.2 % (ref 11.5–15.5)
WBC Count: 16.8 10*3/uL — ABNORMAL HIGH (ref 4.0–10.5)
nRBC: 0 % (ref 0.0–0.2)

## 2021-03-26 LAB — VITAMIN B12: Vitamin B-12: 632 pg/mL (ref 180–914)

## 2021-03-26 LAB — HEPATITIS C ANTIBODY: HCV Ab: NONREACTIVE

## 2021-03-26 LAB — LACTATE DEHYDROGENASE: LDH: 190 U/L (ref 98–192)

## 2021-03-26 NOTE — Patient Instructions (Signed)
Lotrisone has been prescribed for your face skin lesion Labs have been sent to evaluate your abnormal white blood cells Would recommend using salt and baking soda mouthwash 3-4 times a day as discussed WOuld recommend starting to take over the counter B complex 1 capsule orally daily - might help with mouth soreness if related to a vitamin deficiency.

## 2021-03-27 ENCOUNTER — Telehealth: Payer: Self-pay | Admitting: Hematology

## 2021-03-27 LAB — KAPPA/LAMBDA LIGHT CHAINS
Kappa free light chain: 9.7 mg/L (ref 3.3–19.4)
Kappa, lambda light chain ratio: 0.8 (ref 0.26–1.65)
Lambda free light chains: 12.1 mg/L (ref 5.7–26.3)

## 2021-03-27 LAB — FOLATE RBC
Folate, Hemolysate: 349 ng/mL
Folate, RBC: 795 ng/mL (ref >498)
Hematocrit: 43.9 % (ref 34.0–46.6)

## 2021-03-27 NOTE — Telephone Encounter (Signed)
Scheduled follow-up appointment per 7/27 los. Patient is aware. 

## 2021-03-30 DIAGNOSIS — J449 Chronic obstructive pulmonary disease, unspecified: Secondary | ICD-10-CM | POA: Diagnosis not present

## 2021-03-30 DIAGNOSIS — E785 Hyperlipidemia, unspecified: Secondary | ICD-10-CM | POA: Diagnosis not present

## 2021-03-30 DIAGNOSIS — K219 Gastro-esophageal reflux disease without esophagitis: Secondary | ICD-10-CM | POA: Diagnosis not present

## 2021-03-31 LAB — MULTIPLE MYELOMA PANEL, SERUM
Albumin SerPl Elph-Mcnc: 3.9 g/dL (ref 2.9–4.4)
Albumin/Glob SerPl: 1.8 — ABNORMAL HIGH (ref 0.7–1.7)
Alpha 1: 0.2 g/dL (ref 0.0–0.4)
Alpha2 Glob SerPl Elph-Mcnc: 0.7 g/dL (ref 0.4–1.0)
B-Globulin SerPl Elph-Mcnc: 0.9 g/dL (ref 0.7–1.3)
Gamma Glob SerPl Elph-Mcnc: 0.4 g/dL (ref 0.4–1.8)
Globulin, Total: 2.2 g/dL (ref 2.2–3.9)
IgA: 36 mg/dL — ABNORMAL LOW (ref 64–422)
IgG (Immunoglobin G), Serum: 448 mg/dL — ABNORMAL LOW (ref 586–1602)
IgM (Immunoglobulin M), Srm: 27 mg/dL (ref 26–217)
Total Protein ELP: 6.1 g/dL (ref 6.0–8.5)

## 2021-04-02 NOTE — Progress Notes (Addendum)
HEMATOLOGY/ONCOLOGY CONSULTATION NOTE  Date of Service: 04/02/2021  Patient Care Team: Brenda Infante, MD as PCP - General (Internal Medicine)  CHIEF COMPLAINTS/PURPOSE OF CONSULTATION:  leucocytosis  HISTORY OF PRESENTING ILLNESS:   Brenda [name] is a wonderful 87 y.o. female who has been referred to [place] by Minerva Areola Drinkard FNP-CS for evaluation and management of possible Leukemia  The pt reports that she is doing well overall.  The pt reports that she does not quite know why she was sent to be seen by hematology. She notes that she  Never had CLL Never had acute leukemia. Mother and brother had CLL  On review of systems, pt reports  Sore on lip, yeast infection of mouth-she has been doing nystatin mouthwash, notes mild epistaxis. Notes history of significant second hand smoke -- personal non smoker. Shingles in feb 2022 Gastroenteritis feb 2022  Patient had labs with her primary care physician which showed on 02/07/2021 WBC count of 10.9 with a lymphocyte count of 6.1k neutrophils of 3.9k normal hemoglobin of 14.8 and a normal platelet count of 278k  She subsequently had repeat labs on 03/18/2021 which showed a WBC count of 17.7k with a lymphocyte count of 8k neutrophil count of 8.1l normal hemoglobin of 14.4 and platelets of 325k.  Patient is here for further evaluation of her lymphocytosis.  She notes no new lumps or bumps.  No abdominal pain or distention.  No acute new shortness of breath or dyspnea on exertion. No overt new constitutional symptoms.  INTERVAL HISTORY:  I connected with Brenda [name] on 04/03/2021 by telephone and verified that I am speaking with the correct person using two identifiers.   I discussed the limitations of evaluation and management by telemedicine. The patient expressed understanding and agreed to proceed.   Other persons participating in the visit and their role in the encounter:                                                         -  Brenda [name], Medical Scribe     Patient's location: Home Provider's location: [place] [facility] [employer] is a wonderful 87 y.o. female who is here today for f/u of leukocytosis.The patient's last visit with [place] was on 03/26/2021. The pt reports that she is doing well overall.  The pt reports no acute new symptoms.  No fevers no chills no night sweats.  Lab results 03/26/2021 of CBC w/diff and CMP is as follows: all values are WNL except for WBC of 16.8K, Neutro Abs of 7.9K, Lymphs Abs of 7.9K. 03/26/2021 MMP WNL except IgG of 448, IgA of 36. 03/26/2021 Folate RBC of 795. 03/26/2021 LDH of 190. 03/26/2021 Light Chains WNL. 03/26/2021 Hep C non reactive. 03/26/2021 Vit B12 of 632. Flow cytometry was apparently not done by lab due to manpower issues this was redrawn today.  On review of systems, pt reports no other acute new symptoms.   MEDICAL HISTORY:  Past Medical History:  Diagnosis Date   Anxiety    Arthritis    CLL (chronic lymphocytic leukemia) (HCC)    Complication of anesthesia    Depression    Depression with anxiety    Diverticulosis    Dyspnea    GERD (gastroesophageal reflux disease)    Hearing loss  Memory loss    Mild cognitive impairment    Osteopenia    PONV (postoperative nausea and vomiting)    Reflux    SOB (shortness of breath)     SURGICAL HISTORY: Past Surgical History:  Procedure Laterality Date   ABDOMINAL HYSTERECTOMY     APPENDECTOMY     BREAST EXCISIONAL BIOPSY Left    CARDIAC EVENT MONITOR  02/03/2005   30 days, normal event monitor   CATARACT EXTRACTION Bilateral    EYE SURGERY     squamous cell carcinoma surgery in right eye lid.   KNEE ARTHROSCOPY Right 07/02/2016   Procedure: ARTHROSCOPY KNEE WITH DEBRIDEMENT, PARTIAL LATERAL AND MEDIAL MENISCECTOMY, with drainage of cyst;  Surgeon: Susa Day, MD;  Location: [facility];  Service: Orthopedics;  Laterality: Right;   NM GATED MYOCARDIAL STUDY (ARMX HX)  01/23/2011   Normal  pattern of perfusion in all regions. post-stress EF is 85%. EKG negative for ischemia. no ECG changes.   TONSILLECTOMY     TRANSTHORACIC ECHOCARDIOGRAM  10/31/2010   EF >55%, there is concern for inferoacpical infarct with possible inferoapical aneurysm or pseudoaneurysm - could be echo artificat    SOCIAL HISTORY: Social History   Socioeconomic History   Marital status: Married    Spouse name: [name]   Number of children: 2   Years of education: college   Highest education level: Not on file  Occupational History   Not on file  Tobacco Use   Smoking status: Never   Smokeless tobacco: Never  Vaping Use   Vaping Use: Never used  Substance and Sexual Activity   Alcohol use: No   Drug use: No   Sexual activity: Not on file  Other Topics Concern   Not on file  Social History Narrative   Patient lives at home with husband [name].    Patient is retired.    Patient has a college education.    Patient has 2 children.    Patient drinks 2 cups of coffee daily.    Social Determinants of Health   Financial Resource Strain: Not on file  Food Insecurity: Not on file  Transportation Needs: Not on file  Physical Activity: Not on file  Stress: Not on file  Social Connections: Not on file  Intimate Partner Violence: Not on file    FAMILY HISTORY: Family History  Problem Relation Age of Onset   Alzheimer's disease Father    Dementia Father    Atrial fibrillation Brother    Dementia Maternal Grandmother    Dementia Paternal Grandmother     ALLERGIES:  is allergic to azithromycin, adhesive [tape], codeine, doxycycline, and sulfa antibiotics.  MEDICATIONS:  Current Outpatient Medications  Medication Sig Dispense Refill   acetaminophen (TYLENOL) 500 MG tablet Take 500 mg by mouth every 6 (six) hours as needed for moderate pain. (Patient not taking: No sig reported)     atorvastatin (LIPITOR) 20 MG tablet Take 20 mg by mouth daily.     cetirizine (ZYRTEC) 10 MG tablet Take 1  tablet (10 mg total) by mouth daily. (Patient not taking: Reported on 03/26/2021) 30 tablet 5   cycloSPORINE (RESTASIS) 0.05 % ophthalmic emulsion Place 1 drop into both eyes 2 (two) times daily.     desvenlafaxine (PRISTIQ) 50 MG 24 hr tablet Take 50 mg by mouth daily.     estradiol (ESTRING) 2 MG vaginal ring Place 2 mg vaginally every 3 (three) months. follow package directions     gabapentin (NEURONTIN) 100 MG capsule  Take 2 capsules (200 mg total) by mouth 3 (three) times daily for 7 days. 42 capsule 0   LORazepam (ATIVAN) 0.5 MG tablet Take 0.5 mg by mouth at bedtime.      metoCLOPramide (REGLAN) 10 MG tablet Take 1 tablet (10 mg total) by mouth every 6 (six) hours as needed for nausea or vomiting. (Patient not taking: No sig reported) 12 tablet 0   Multiple Vitamin (MULTIVITAMIN) capsule Take 1 capsule by mouth daily.     Multiple Vitamins-Minerals (PRESERVISION AREDS 2+MULTI VIT PO) Take 1 capsule by mouth daily.     MYRBETRIQ 50 MG TB24 tablet Take 50 mg by mouth daily.     Olopatadine HCl (PATADAY) 0.2 % SOLN Place 1 drop into both eyes 1 day or 1 dose. (Patient not taking: Reported on 03/26/2021) 2.5 mL 5   ondansetron (ZOFRAN-ODT) 8 MG disintegrating tablet Take 8 mg by mouth every 8 (eight) hours as needed for nausea or vomiting.     pantoprazole (PROTONIX) 40 MG tablet Take 40 mg by mouth daily.     solifenacin (VESICARE) 5 MG tablet Take 5 mg by mouth daily.     ZETIA 10 MG tablet Take 10 mg by mouth at bedtime.     zolpidem (AMBIEN) 5 MG tablet Take 5 mg by mouth at bedtime.     No current facility-administered medications for this visit.    REVIEW OF SYSTEMS:    10 Point review of Systems was done is negative except as noted above.  PHYSICAL EXAMINATION: ECOG PERFORMANCE STATUS: 1 - Symptomatic but completely ambulatory  .There were no vitals filed for this visit. There were no vitals filed for this visit. .There is no height or weight on file to calculate  BMI.  Telehealth Visit.  LABORATORY DATA:  I have reviewed the data as listed  . CBC Latest Ref Rng & Units 03/26/2021 03/26/2021 10/23/2020  WBC 4.0 - 10.5 K/uL 16.8(H) - 8.8  Hemoglobin 12.0 - 15.0 g/dL 14.6 - 13.0  Hematocrit 34.0 - 46.6 % 43.9 43.4 39.1  Platelets 150 - 400 K/uL 361 - 225   .CBC    Component Value Date/Time   WBC 16.8 (H) 03/26/2021 1415   WBC 8.8 10/23/2020 0433   RBC 4.81 03/26/2021 1415   HGB 14.6 03/26/2021 1415   HGB 14.8 02/04/2010 1423   HCT 43.9 03/26/2021 1416   HCT 43.4 03/26/2021 1415   HCT 42.8 02/04/2010 1423   PLT 361 03/26/2021 1415   PLT 272 02/04/2010 1423   MCV 90.2 03/26/2021 1415   MCV 99.3 (A) 08/19/2013 1223   MCV 93.2 02/04/2010 1423   MCH 30.4 03/26/2021 1415   MCHC 33.6 03/26/2021 1415   RDW 13.2 03/26/2021 1415   RDW 13.4 02/04/2010 1423   LYMPHSABS 7.9 (H) 03/26/2021 1415   LYMPHSABS 7.6 (H) 02/04/2010 1423   MONOABS 0.7 03/26/2021 1415   MONOABS 0.5 02/04/2010 1423   EOSABS 0.1 03/26/2021 1415   EOSABS 0.1 02/04/2010 1423   BASOSABS 0.1 03/26/2021 1415   BASOSABS 0.0 02/04/2010 1423     . CMP Latest Ref Rng & Units 03/26/2021 10/23/2020 10/22/2020  Glucose 70 - 99 mg/dL 98 103(H) 177(H)  BUN 8 - 23 mg/dL 14 7(L) 6(L)  Creatinine 0.44 - 1.00 mg/dL 0.79 0.58 0.62  Sodium 135 - 145 mmol/L 138 134(L) 137  Potassium 3.5 - 5.1 mmol/L 4.3 3.8 3.6  Chloride 98 - 111 mmol/L 103 105 104  CO2 22 - 32 mmol/L 27  22 25  Calcium 8.9 - 10.3 mg/dL 9.7 8.1(L) 8.4(L)  Total Protein 6.5 - 8.1 g/dL 6.8 - -  Total Bilirubin 0.3 - 1.2 mg/dL 0.4 - -  Alkaline Phos 38 - 126 U/L 71 - -  AST 15 - 41 U/L 19 - -  ALT 0 - 44 U/L 17 - -     RADIOGRAPHIC STUDIES: I have personally reviewed the radiological images as listed and agreed with the findings in the report. No results found.  ASSESSMENT & PLAN:   87 year old female referred to [place] by Dr. Joylene Draft for evaluation of leukocytosis  #1 leukocytosis/lymphocytosis.  Patient also has  some mild neutrophilia. No obvious lymphadenopathy or hepatosplenomegaly on clinical examination Rule out a possible chronic lymphoproliferative disorder such as CLL.  PLAN: -Discussed pt labwork, 03/26/2021;  CBC w/diff and CMP is as follows: all values are WNL except for WBC of 16.8K, Neutro Abs of 7.9K, Lymphs Abs of 7.9K. 03/26/2021 MMP WNL except IgG of 448, IgA of 36. 03/26/2021 Folate RBC of 795. 03/26/2021 LDH of 190. 03/26/2021 Light Chains WNL. 03/26/2021 Hep C non reactive. 03/26/2021 Vit B12 of 632. Flow cytometry was apparently not done by lab due to manpower issues this was redrawn today. -Discussed with patient that her labs do not appear consistent with an acute leukemia and are likely related to a chronic lymphoproliferative disorder such as CLL or monoclonal B lymphocytosis. -The flow cytometry labs were drawn again today and I shall call her with the results. . No orders of the defined types were placed in this encounter.   #2 . Past Medical History:  Diagnosis Date   Anxiety    Arthritis    CLL (chronic lymphocytic leukemia) (HCC)    Complication of anesthesia    Depression    Depression with anxiety    Diverticulosis    Dyspnea    GERD (gastroesophageal reflux disease)    Hearing loss    Memory loss    Mild cognitive impairment    Osteopenia    PONV (postoperative nausea and vomiting)    Reflux    SOB (shortness of breath)    -MX of chronic medical issues per PCP  # 3  Recurrent aphthous ulcers -Patient was recommended to use over-the-counter B complex empirically to help with this -We also talked about using salt and baking soda mouthwashes  FOLLOW UP: Labs today Will call patient with results   All of the patients questions were answered with apparent satisfaction. The patient knows to call the clinic with any problems, questions or concerns.   The total time spent in the appointment was 20 minutes and more than 50% was on counseling and  direct patient cares.    Brenda Lone MD [place] AAHIVMS [facility] Hematology/Oncology Physician [facility]  (Office):       [phone] (Work cell):  [phone] (Fax):           [phone]  04/02/2021 6:59 PM   I, Brenda [name], am acting as scribe for Dr. Sullivan Lone, MD.   .I have reviewed the above documentation for accuracy and completeness, and I agree with the above. Brunetta Genera MD    ADDENDUM  Flow cytometry- DIAGNOSIS:   -Abnormal B-cell population identified  -See comment   COMMENT:   Flow cytometric analysis of the lymphoid population shows a major  population of abnormal B cells expressing pan B-cell antigens including  CD20 associated with CD5 and CD200 expression.  Surface  immunoglobulin  light chain expression is dim/negative.  Nonetheless, the findings are  consistent with a B-cell lymphoproliferative process.  Since the number  of abnormal B cells is less than 5 K/uL, the findings may represent  monoclonal B-cell lymphocytosis especially in the absence of  organomegaly, lymphadenopathy or extra medullary tissue involvement.  Clinical correlation is recommended.   ADDENDUM  Called and discussed flow cytometry results with Mrs. Coots.  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  We will plan to see her back with labs in 6 months and plan to get whole-body CT scan to rule out any presence of lymphadenopathy or splenomegaly which would qualify her for a diagnosis of CLL. At this time she has less than 5000 clonal lymphocytes which does not meet the criteria for CLL and is consistent with monoclonal B lymphocytosis. No indication for any treatment at this time.  Brunetta Genera MD

## 2021-04-03 ENCOUNTER — Other Ambulatory Visit: Payer: Self-pay

## 2021-04-03 ENCOUNTER — Inpatient Hospital Stay: Payer: Medicare Other

## 2021-04-03 ENCOUNTER — Inpatient Hospital Stay: Payer: Medicare Other | Attending: Hematology | Admitting: Hematology

## 2021-04-03 VITALS — BP 144/72 | HR 92 | Temp 98.3°F | Resp 17 | Ht 66.0 in | Wt 174.0 lb

## 2021-04-03 DIAGNOSIS — D72820 Lymphocytosis (symptomatic): Secondary | ICD-10-CM

## 2021-04-03 DIAGNOSIS — Z79899 Other long term (current) drug therapy: Secondary | ICD-10-CM | POA: Diagnosis not present

## 2021-04-03 LAB — SAVE SMEAR(SSMR), FOR PROVIDER SLIDE REVIEW

## 2021-04-03 LAB — SURGICAL PATHOLOGY

## 2021-04-07 DIAGNOSIS — B370 Candidal stomatitis: Secondary | ICD-10-CM | POA: Diagnosis not present

## 2021-04-08 LAB — FLOW CYTOMETRY

## 2021-04-17 ENCOUNTER — Other Ambulatory Visit: Payer: Self-pay | Admitting: Hematology

## 2021-04-17 DIAGNOSIS — C9110 Chronic lymphocytic leukemia of B-cell type not having achieved remission: Secondary | ICD-10-CM

## 2021-04-17 NOTE — Progress Notes (Unsigned)
T chest

## 2021-04-30 DIAGNOSIS — J449 Chronic obstructive pulmonary disease, unspecified: Secondary | ICD-10-CM | POA: Diagnosis not present

## 2021-04-30 DIAGNOSIS — E785 Hyperlipidemia, unspecified: Secondary | ICD-10-CM | POA: Diagnosis not present

## 2021-04-30 DIAGNOSIS — K219 Gastro-esophageal reflux disease without esophagitis: Secondary | ICD-10-CM | POA: Diagnosis not present

## 2021-05-08 DIAGNOSIS — H353131 Nonexudative age-related macular degeneration, bilateral, early dry stage: Secondary | ICD-10-CM | POA: Diagnosis not present

## 2021-05-08 DIAGNOSIS — H16223 Keratoconjunctivitis sicca, not specified as Sjogren's, bilateral: Secondary | ICD-10-CM | POA: Diagnosis not present

## 2021-05-08 DIAGNOSIS — H52203 Unspecified astigmatism, bilateral: Secondary | ICD-10-CM | POA: Diagnosis not present

## 2021-05-08 DIAGNOSIS — H5203 Hypermetropia, bilateral: Secondary | ICD-10-CM | POA: Diagnosis not present

## 2021-06-07 DIAGNOSIS — Z23 Encounter for immunization: Secondary | ICD-10-CM | POA: Diagnosis not present

## 2021-06-30 DIAGNOSIS — K117 Disturbances of salivary secretion: Secondary | ICD-10-CM | POA: Diagnosis not present

## 2021-06-30 DIAGNOSIS — K146 Glossodynia: Secondary | ICD-10-CM | POA: Diagnosis not present

## 2021-06-30 DIAGNOSIS — H04123 Dry eye syndrome of bilateral lacrimal glands: Secondary | ICD-10-CM | POA: Diagnosis not present

## 2021-07-02 DIAGNOSIS — H10413 Chronic giant papillary conjunctivitis, bilateral: Secondary | ICD-10-CM | POA: Diagnosis not present

## 2021-07-02 DIAGNOSIS — H04123 Dry eye syndrome of bilateral lacrimal glands: Secondary | ICD-10-CM | POA: Diagnosis not present

## 2021-07-08 DIAGNOSIS — Z23 Encounter for immunization: Secondary | ICD-10-CM | POA: Diagnosis not present

## 2021-07-16 DIAGNOSIS — H01005 Unspecified blepharitis left lower eyelid: Secondary | ICD-10-CM | POA: Diagnosis not present

## 2021-07-16 DIAGNOSIS — H01002 Unspecified blepharitis right lower eyelid: Secondary | ICD-10-CM | POA: Diagnosis not present

## 2021-07-16 DIAGNOSIS — H16223 Keratoconjunctivitis sicca, not specified as Sjogren's, bilateral: Secondary | ICD-10-CM | POA: Diagnosis not present

## 2021-07-30 ENCOUNTER — Telehealth: Payer: Self-pay | Admitting: Hematology

## 2021-07-30 NOTE — Telephone Encounter (Signed)
Scheduled appt per 11/29 sch msg. Pt is aware of appt.

## 2021-08-05 ENCOUNTER — Ambulatory Visit: Payer: Medicare Other | Admitting: Allergy & Immunology

## 2021-08-18 ENCOUNTER — Ambulatory Visit (HOSPITAL_COMMUNITY): Payer: Medicare Other

## 2021-08-18 DIAGNOSIS — K146 Glossodynia: Secondary | ICD-10-CM | POA: Diagnosis not present

## 2021-08-18 DIAGNOSIS — D1801 Hemangioma of skin and subcutaneous tissue: Secondary | ICD-10-CM | POA: Diagnosis not present

## 2021-08-18 DIAGNOSIS — C9100 Acute lymphoblastic leukemia not having achieved remission: Secondary | ICD-10-CM | POA: Diagnosis not present

## 2021-08-18 DIAGNOSIS — M8580 Other specified disorders of bone density and structure, unspecified site: Secondary | ICD-10-CM | POA: Diagnosis not present

## 2021-08-18 DIAGNOSIS — L821 Other seborrheic keratosis: Secondary | ICD-10-CM | POA: Diagnosis not present

## 2021-08-18 DIAGNOSIS — K219 Gastro-esophageal reflux disease without esophagitis: Secondary | ICD-10-CM | POA: Diagnosis not present

## 2021-08-18 DIAGNOSIS — R7301 Impaired fasting glucose: Secondary | ICD-10-CM | POA: Diagnosis not present

## 2021-08-18 DIAGNOSIS — Z85828 Personal history of other malignant neoplasm of skin: Secondary | ICD-10-CM | POA: Diagnosis not present

## 2021-08-18 DIAGNOSIS — D225 Melanocytic nevi of trunk: Secondary | ICD-10-CM | POA: Diagnosis not present

## 2021-08-18 DIAGNOSIS — E785 Hyperlipidemia, unspecified: Secondary | ICD-10-CM | POA: Diagnosis not present

## 2021-08-18 DIAGNOSIS — K1379 Other lesions of oral mucosa: Secondary | ICD-10-CM | POA: Diagnosis not present

## 2021-08-18 DIAGNOSIS — L820 Inflamed seborrheic keratosis: Secondary | ICD-10-CM | POA: Diagnosis not present

## 2021-08-18 DIAGNOSIS — J449 Chronic obstructive pulmonary disease, unspecified: Secondary | ICD-10-CM | POA: Diagnosis not present

## 2021-08-18 DIAGNOSIS — L570 Actinic keratosis: Secondary | ICD-10-CM | POA: Diagnosis not present

## 2021-08-18 DIAGNOSIS — M48061 Spinal stenosis, lumbar region without neurogenic claudication: Secondary | ICD-10-CM | POA: Diagnosis not present

## 2021-09-04 DIAGNOSIS — H01002 Unspecified blepharitis right lower eyelid: Secondary | ICD-10-CM | POA: Diagnosis not present

## 2021-09-18 ENCOUNTER — Ambulatory Visit (HOSPITAL_COMMUNITY)
Admission: RE | Admit: 2021-09-18 | Discharge: 2021-09-18 | Disposition: A | Discharge status: Home / Self Care | Payer: Medicare Other | Source: Ambulatory Visit | Attending: Hematology | Admitting: Hematology

## 2021-09-18 ENCOUNTER — Other Ambulatory Visit: Payer: Self-pay

## 2021-09-18 ENCOUNTER — Inpatient Hospital Stay: Payer: Medicare Other | Attending: Hematology

## 2021-09-18 DIAGNOSIS — C9110 Chronic lymphocytic leukemia of B-cell type not having achieved remission: Secondary | ICD-10-CM | POA: Diagnosis not present

## 2021-09-18 DIAGNOSIS — I898 Other specified noninfective disorders of lymphatic vessels and lymph nodes: Secondary | ICD-10-CM | POA: Diagnosis not present

## 2021-09-18 DIAGNOSIS — M4319 Spondylolisthesis, multiple sites in spine: Secondary | ICD-10-CM | POA: Diagnosis not present

## 2021-09-18 DIAGNOSIS — D72820 Lymphocytosis (symptomatic): Secondary | ICD-10-CM | POA: Insufficient documentation

## 2021-09-18 DIAGNOSIS — K429 Umbilical hernia without obstruction or gangrene: Secondary | ICD-10-CM | POA: Diagnosis not present

## 2021-09-18 DIAGNOSIS — K449 Diaphragmatic hernia without obstruction or gangrene: Secondary | ICD-10-CM | POA: Diagnosis not present

## 2021-09-18 DIAGNOSIS — M47816 Spondylosis without myelopathy or radiculopathy, lumbar region: Secondary | ICD-10-CM | POA: Diagnosis not present

## 2021-09-18 DIAGNOSIS — K7689 Other specified diseases of liver: Secondary | ICD-10-CM | POA: Diagnosis not present

## 2021-09-18 LAB — CMP (CANCER CENTER ONLY)
ALT: 20 U/L (ref 0–44)
AST: 21 U/L (ref 15–41)
Albumin: 4.5 g/dL (ref 3.5–5.0)
Alkaline Phosphatase: 60 U/L (ref 38–126)
Anion gap: 7 (ref 5–15)
BUN: 14 mg/dL (ref 8–23)
CO2: 29 mmol/L (ref 22–32)
Calcium: 9.2 mg/dL (ref 8.9–10.3)
Chloride: 101 mmol/L (ref 98–111)
Creatinine: 0.72 mg/dL (ref 0.44–1.00)
GFR, Estimated: >60 mL/min (ref >60)
Glucose, Bld: 104 mg/dL — ABNORMAL HIGH (ref 70–99)
Potassium: 4 mmol/L (ref 3.5–5.1)
Sodium: 137 mmol/L (ref 135–145)
Total Bilirubin: 0.6 mg/dL (ref 0.3–1.2)
Total Protein: 6.7 g/dL (ref 6.5–8.1)

## 2021-09-18 LAB — LACTATE DEHYDROGENASE: LDH: 166 U/L (ref 98–192)

## 2021-09-18 LAB — CBC WITH DIFFERENTIAL (CANCER CENTER ONLY)
Abs Immature Granulocytes: 0.02 10*3/uL (ref 0.00–0.07)
Basophils Absolute: 0.1 10*3/uL (ref 0.0–0.1)
Basophils Relative: 1 %
Eosinophils Absolute: 0.1 10*3/uL (ref 0.0–0.5)
Eosinophils Relative: 1 %
HCT: 44.8 % (ref 36.0–46.0)
Hemoglobin: 14.8 g/dL (ref 12.0–15.0)
Immature Granulocytes: 0 %
Lymphocytes Relative: 54 %
Lymphs Abs: 6 10*3/uL — ABNORMAL HIGH (ref 0.7–4.0)
MCH: 29.8 pg (ref 26.0–34.0)
MCHC: 33 g/dL (ref 30.0–36.0)
MCV: 90.3 fL (ref 80.0–100.0)
Monocytes Absolute: 0.7 10*3/uL (ref 0.1–1.0)
Monocytes Relative: 6 %
Neutro Abs: 4.2 10*3/uL (ref 1.7–7.7)
Neutrophils Relative %: 38 %
Platelet Count: 309 10*3/uL (ref 150–400)
RBC: 4.96 MIL/uL (ref 3.87–5.11)
RDW: 13.7 % (ref 11.5–15.5)
WBC Count: 11.1 10*3/uL — ABNORMAL HIGH (ref 4.0–10.5)
nRBC: 0 % (ref 0.0–0.2)

## 2021-09-18 MED ORDER — IOHEXOL 300 MG/ML  SOLN
100.0000 mL | Freq: Once | INTRAMUSCULAR | Status: AC | PRN
  Administered 2021-09-18: 100 mL via INTRAVENOUS

## 2021-09-18 MED ORDER — SODIUM CHLORIDE (PF) 0.9 % IJ SOLN
INTRAMUSCULAR | Status: AC
  Filled 2021-09-18: qty 50

## 2021-10-01 ENCOUNTER — Inpatient Hospital Stay: Payer: Medicare Other | Attending: Hematology | Admitting: Hematology

## 2021-10-01 ENCOUNTER — Inpatient Hospital Stay: Payer: Medicare Other

## 2021-10-01 ENCOUNTER — Other Ambulatory Visit: Payer: Self-pay

## 2021-10-01 VITALS — BP 146/75 | HR 86 | Temp 97.3°F | Resp 18 | Wt 176.0 lb

## 2021-10-01 DIAGNOSIS — Z79899 Other long term (current) drug therapy: Secondary | ICD-10-CM | POA: Diagnosis not present

## 2021-10-01 DIAGNOSIS — E041 Nontoxic single thyroid nodule: Secondary | ICD-10-CM | POA: Diagnosis not present

## 2021-10-01 DIAGNOSIS — D72820 Lymphocytosis (symptomatic): Secondary | ICD-10-CM | POA: Diagnosis not present

## 2021-10-01 DIAGNOSIS — C9110 Chronic lymphocytic leukemia of B-cell type not having achieved remission: Secondary | ICD-10-CM | POA: Diagnosis not present

## 2021-10-02 ENCOUNTER — Telehealth: Payer: Self-pay | Admitting: Hematology

## 2021-10-02 NOTE — Telephone Encounter (Signed)
Scheduled follow-up appointment per 2/1 los. Patient is aware. °

## 2021-10-07 NOTE — Progress Notes (Incomplete Revision)
HEMATOLOGY/ONCOLOGY CONSULTATION NOTE  Date of Service: 10/16/2021  Patient Care Team: Crist Infante, MD as PCP - General (Internal Medicine)  CHIEF COMPLAINTS/PURPOSE OF CONSULTATION:  Lymphocytosis  HISTORY OF PRESENTING ILLNESS:  Please see previous note for details of presentation. Communicating okay so previously with a little slight translation of course there was overlap of 1 to 2 weeks  Evaluation of oral once we captured the floor of the note.  I do not think he is having of time to be able to do that  He wanted to shadow you little bit more or somebody is able to look over her shoulder while he is rounding with me.  I will have time to his other notes and make sense More time trying to correct and then writing myself so I I think I will know if is easier for you to have him shall you and maybe do notes with Dr. Morey Hummingbird and you can supervise him if that was allergic to have both her work in on things together just knowing what information to capture in which things in and what things need to be rewritten for medical Medicare rules regarding his COVID and supply Of Lovenox I will review previous notes on repeat refill some of the history and physicals past medical history from previous note. Been quite helpful he is not diabetic Very quickly but I think it is a spot (jumping here without having had that one-to-one supervision while he is working with me or anybody else in an ideal world I will have time to go over each aspect of the note was in but I just do not Every 2 days and the other 1 I do not have time to do before this is not possible for me to be vascular in that case please do not so  See health coordinator work that he wanted to shadow you little bit more for few more weeks  Okay with and then maybe he can do some of your notes and you can supervise and creatinine that might work better thank you so much INTERVAL HISTORY:  Brenda [name] is a wonderful 88 y.o. female  who is here today for f/u of leukocytosis.The patient's last visit with [place] was on 03/26/2021. The pt reports that she is doing well overall.  The pt reports no acute new symptoms.  No fevers no chills no night sweats.  Lab results 03/26/2021 of CBC w/diff and CMP is as follows: all values are WNL except for WBC of 16.8K, Neutro Abs of 7.9K, Lymphs Abs of 7.9K. 03/26/2021 MMP WNL except IgG of 448, IgA of 36. 03/26/2021 Folate RBC of 795. 03/26/2021 LDH of 190. 03/26/2021 Light Chains WNL. 03/26/2021 Hep C non reactive. 03/26/2021 Vit B12 of 632. Flow cytometry was apparently not done by lab due to manpower issues this was redrawn today.  On review of systems, pt reports no other acute new symptoms.   MEDICAL HISTORY:  Past Medical History:  Diagnosis Date   Anxiety    Arthritis    CLL (chronic lymphocytic leukemia) (HCC)    Complication of anesthesia    Depression    Depression with anxiety    Diverticulosis    Dyspnea    GERD (gastroesophageal reflux disease)    Hearing loss    Memory loss    Mild cognitive impairment    Osteopenia    PONV (postoperative nausea and vomiting)    Reflux    SOB (shortness of breath)  SURGICAL HISTORY: Past Surgical History:  Procedure Laterality Date   ABDOMINAL HYSTERECTOMY     APPENDECTOMY     BREAST EXCISIONAL BIOPSY Left    CARDIAC EVENT MONITOR  02/03/2005   30 days, normal event monitor   CATARACT EXTRACTION Bilateral    EYE SURGERY     squamous cell carcinoma surgery in right eye lid.   KNEE ARTHROSCOPY Right 07/02/2016   Procedure: ARTHROSCOPY KNEE WITH DEBRIDEMENT, PARTIAL LATERAL AND MEDIAL MENISCECTOMY, with drainage of cyst;  Surgeon: Susa Day, MD;  Location: [facility];  Service: Orthopedics;  Laterality: Right;   NM GATED MYOCARDIAL STUDY (ARMX HX)  01/23/2011   Normal pattern of perfusion in all regions. post-stress EF is 85%. EKG negative for ischemia. no ECG changes.   TONSILLECTOMY     TRANSTHORACIC  ECHOCARDIOGRAM  10/31/2010   EF >55%, there is concern for inferoacpical infarct with possible inferoapical aneurysm or pseudoaneurysm - could be echo artificat    SOCIAL HISTORY: Social History   Socioeconomic History   Marital status: Married    Spouse name: [name]   Number of children: 2   Years of education: college   Highest education level: Not on file  Occupational History   Not on file  Tobacco Use   Smoking status: Never   Smokeless tobacco: Never  Vaping Use   Vaping Use: Never used  Substance and Sexual Activity   Alcohol use: No   Drug use: No   Sexual activity: Not on file  Other Topics Concern   Not on file  Social History Narrative   Patient lives at home with husband [name].    Patient is retired.    Patient has a college education.    Patient has 2 children.    Patient drinks 2 cups of coffee daily.    Social Determinants of Health   Financial Resource Strain: Not on file  Food Insecurity: Not on file  Transportation Needs: Not on file  Physical Activity: Not on file  Stress: Not on file  Social Connections: Not on file  Intimate Partner Violence: Not on file    FAMILY HISTORY: Family History  Problem Relation Age of Onset   Alzheimer's disease Father    Dementia Father    Atrial fibrillation Brother    Dementia Maternal Grandmother    Dementia Paternal Grandmother     ALLERGIES:  is allergic to azithromycin, valium [diazepam], adhesive [tape], codeine, doxycycline, and sulfa antibiotics.  MEDICATIONS:  Current Outpatient Medications  Medication Sig Dispense Refill   acetaminophen (TYLENOL) 500 MG tablet Take 500 mg by mouth every 6 (six) hours as needed for moderate pain.     atorvastatin (LIPITOR) 20 MG tablet Take 20 mg by mouth daily.     cetirizine (ZYRTEC) 10 MG tablet Take 1 tablet (10 mg total) by mouth daily. 30 tablet 5   cycloSPORINE (RESTASIS) 0.05 % ophthalmic emulsion Place 1 drop into both eyes 2 (two) times daily.      desvenlafaxine (PRISTIQ) 50 MG 24 hr tablet Take 50 mg by mouth daily.     estradiol (ESTRING) 2 MG vaginal ring Place 2 mg vaginally every 3 (three) months. follow package directions     gabapentin (NEURONTIN) 100 MG capsule Take 2 capsules (200 mg total) by mouth 3 (three) times daily for 7 days. 42 capsule 0   LORazepam (ATIVAN) 0.5 MG tablet Take 0.5 mg by mouth at bedtime.      metoCLOPramide (REGLAN) 10 MG tablet Take 1 tablet (  10 mg total) by mouth every 6 (six) hours as needed for nausea or vomiting. (Patient not taking: No sig reported) 12 tablet 0   Multiple Vitamin (MULTIVITAMIN) capsule Take 1 capsule by mouth daily.     Multiple Vitamins-Minerals (PRESERVISION AREDS 2+MULTI VIT PO) Take 1 capsule by mouth daily.     MYRBETRIQ 50 MG TB24 tablet Take 50 mg by mouth daily.     Olopatadine HCl (PATADAY) 0.2 % SOLN Place 1 drop into both eyes 1 day or 1 dose. 2.5 mL 5   ondansetron (ZOFRAN-ODT) 8 MG disintegrating tablet Take 8 mg by mouth every 8 (eight) hours as needed for nausea or vomiting. (Patient not taking: Reported on 04/03/2021)     pantoprazole (PROTONIX) 40 MG tablet Take 40 mg by mouth daily.     solifenacin (VESICARE) 5 MG tablet Take 5 mg by mouth daily.     ZETIA 10 MG tablet Take 10 mg by mouth at bedtime. (Patient not taking: Reported on 04/03/2021)     zolpidem (AMBIEN) 5 MG tablet Take 5 mg by mouth at bedtime.     No current facility-administered medications for this visit.    REVIEW OF SYSTEMS:    10 Point review of Systems was done is negative except as noted above.  PHYSICAL EXAMINATION: ECOG PERFORMANCE STATUS: 1 - Symptomatic but completely ambulatory  . Vitals:   10/01/21 1410  BP: (!) 146/75  Pulse: 86  Resp: 18  Temp: (!) 97.3 F (36.3 C)  SpO2: 97%   Filed Weights   10/01/21 1410  Weight: 176 lb (79.8 kg)   .Body mass index is 28.41 kg/m.  Telehealth Visit.  LABORATORY DATA:  I have reviewed the data as listed  . CBC Latest Ref Rng &  Units 09/18/2021 03/26/2021 03/26/2021  WBC 4.0 - 10.5 K/uL 11.1(H) 16.8(H) -  Hemoglobin 12.0 - 15.0 g/dL 14.8 14.6 -  Hematocrit 36.0 - 46.0 % 44.8 43.9 43.4  Platelets 150 - 400 K/uL 309 361 -   .CBC    Component Value Date/Time   WBC 11.1 (H) 09/18/2021 0959   WBC 8.8 10/23/2020 0433   RBC 4.96 09/18/2021 0959   HGB 14.8 09/18/2021 0959   HGB 14.8 02/04/2010 1423   HCT 44.8 09/18/2021 0959   HCT 43.9 03/26/2021 1416   HCT 42.8 02/04/2010 1423   PLT 309 09/18/2021 0959   PLT 272 02/04/2010 1423   MCV 90.3 09/18/2021 0959   MCV 99.3 (A) 08/19/2013 1223   MCV 93.2 02/04/2010 1423   MCH 29.8 09/18/2021 0959   MCHC 33.0 09/18/2021 0959   RDW 13.7 09/18/2021 0959   RDW 13.4 02/04/2010 1423   LYMPHSABS 6.0 (H) 09/18/2021 0959   LYMPHSABS 7.6 (H) 02/04/2010 1423   MONOABS 0.7 09/18/2021 0959   MONOABS 0.5 02/04/2010 1423   EOSABS 0.1 09/18/2021 0959   EOSABS 0.1 02/04/2010 1423   BASOSABS 0.1 09/18/2021 0959   BASOSABS 0.0 02/04/2010 1423     . CMP Latest Ref Rng & Units 09/18/2021 03/26/2021 10/23/2020  Glucose 70 - 99 mg/dL 104(H) 98 103(H)  BUN 8 - 23 mg/dL 14 14 7(L)  Creatinine 0.44 - 1.00 mg/dL 0.72 0.79 0.58  Sodium 135 - 145 mmol/L 137 138 134(L)  Potassium 3.5 - 5.1 mmol/L 4.0 4.3 3.8  Chloride 98 - 111 mmol/L 101 103 105  CO2 22 - 32 mmol/L 29 27 22   Calcium 8.9 - 10.3 mg/dL 9.2 9.7 8.1(L)  Total Protein 6.5 - 8.1 g/dL  6.7 6.8 -  Total Bilirubin 0.3 - 1.2 mg/dL 0.6 0.4 -  Alkaline Phos 38 - 126 U/L 60 71 -  AST 15 - 41 U/L 21 19 -  ALT 0 - 44 U/L 20 17 -     RADIOGRAPHIC STUDIES: I have personally reviewed the radiological images as listed and agreed with the findings in the report. 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 EXAM: CT CHEST, ABDOMEN, AND PELVIS WITH CONTRAST TECHNIQUE: Multidetector CT imaging of the chest, abdomen and pelvis was  performed following the standard protocol during bolus administration of intravenous contrast. RADIATION DOSE REDUCTION: This exam was performed according to the departmental dose-optimization program which includes automated exposure control, adjustment of the mA and/or kV according to patient size and/or use of iterative reconstruction technique. CONTRAST:  139mL OMNIPAQUE IOHEXOL 300 MG/ML  SOLN COMPARISON:  Multiple exams, including 10/19/2020 FINDINGS: CT CHEST FINDINGS Cardiovascular: Atherosclerotic calcification of the thoracic aorta and branch vessels. Mediastinum/Nodes: 2.5 by 2.3 by 1.8 cm solid heterogeneously enhancing nodule of the thyroid isthmus. Small calcified subcarinal and left infrahilar lymph nodes compatible with old granulomatous disease. Moderate-sized type 3 hiatal hernia. Lungs/Pleura: Mild anteromedial scarring versus subsegmental atelectasis in the right upper lobe, right middle lobe, and right lower lobe. Musculoskeletal: Unremarkable CT ABDOMEN PELVIS FINDINGS Hepatobiliary: 0.8 by 0.6 cm hypodense lesion medially in the right hepatic lobe on image 52 of series 2, stable. 0.3 cm hypodense lesion peripherally in the right hepatic lobe also on image 52 of series 2, stable. These are likely small cysts although technically nonspecific due to small size, but both are stable from 2012. Common bile duct 0.8 cm in diameter, within normal limits for age. Pancreas: Unremarkable Spleen: Punctate calcifications in the spleen compatible with old granulomatous disease. The spleen measures 7.8 by 5.8 by 5.6 cm (volume = 130 cm^3), within normal limits. No worrisome splenic lesion. Adrenals/Urinary Tract: Bilateral peripelvic cysts. No hydronephrosis or hydroureter. No urinary tract calculi. Adrenal glands unremarkable. Stomach/Bowel: Hiatal hernia is noted above. Mildly mobile cecum near the midline. Vascular/Lymphatic: Atherosclerosis is present, including aortoiliac atherosclerotic disease. No  pathologic adenopathy is identified. Reproductive: Uterus absent.  Vaginal ring device noted. Other: No supplemental non-categorized findings. Musculoskeletal: Grade 1 degenerative retrolisthesis at L2-3 and L3-4 with grade 1 anterolisthesis at L5-S1. In conjunction with lumbar spondylosis and degenerative disc disease there is multilevel lumbar impingement. Small umbilical hernia contains adipose tissue. IMPRESSION: 1. No adenopathy, splenic lesions, or specific indicator of active malignancy. 2. 2.5 cm heterogeneously enhancing nodule of the thyroid isthmus. Recommend thyroid US if clinically warranted given patient age. Reference: J Am Coll Radiol. 2015 Feb;12(2): 143-50 3. Other imaging findings of potential clinical significance: Aortic Atherosclerosis (ICD10-I70.0). Old granulomatous disease. Moderate-sized type 3 hiatal hernia. Multilevel lumbar impingement. Electronically Signed   By: Van Clines M.D.   On: 09/18/2021 17:12    ASSESSMENT & PLAN:   88 year old female referred to [place] by Dr. Joylene Draft for evaluation of leukocytosis  #1 leukocytosis/lymphocytosis.  Patient also has some mild neutrophilia. No obvious lymphadenopathy or hepatosplenomegaly on clinical examination Rule out a possible chronic lymphoproliferative disorder such as CLL.  PLAN: -Discussed pt labwork, 03/26/2021;  CBC w/diff and CMP is as follows: all values are WNL except for WBC of 16.8K, Neutro Abs of 7.9K, Lymphs Abs of 7.9K. 03/26/2021 MMP WNL except IgG of 448, IgA of 36. 03/26/2021 Folate RBC of 795. 03/26/2021 LDH of  190. 03/26/2021 Light Chains WNL. 03/26/2021 Hep C non reactive. 03/26/2021 Vit B12 of 632. Flow cytometry was apparently not done by lab due to manpower issues this was redrawn today. -Discussed with patient that her labs do not appear consistent with an acute leukemia and are likely related to a chronic lymphoproliferative disorder such as CLL or monoclonal B lymphocytosis. -The flow cytometry  labs were drawn again today and I shall call her with the results. . Orders Placed This Encounter  Procedures   US THYROID    Standing Status:   Future    Standing Expiration Date:   10/16/2022    Order Specific Question:   Reason for Exam (SYMPTOM  OR DIAGNOSIS REQUIRED)    Answer:   Further evaluation of heterogeneous thyroid nodule noted on recent CT scan    Order Specific Question:   Preferred imaging location?    Answer:   [facility]     #2 . Past Medical History:  Diagnosis Date   Anxiety    Arthritis    CLL (chronic lymphocytic leukemia) (HCC)    Complication of anesthesia    Depression    Depression with anxiety    Diverticulosis    Dyspnea    GERD (gastroesophageal reflux disease)    Hearing loss    Memory loss    Mild cognitive impairment    Osteopenia    PONV (postoperative nausea and vomiting)    Reflux    SOB (shortness of breath)    -MX of chronic medical issues per PCP  # 3  Recurrent aphthous ulcers -Patient was recommended to use over-the-counter B complex empirically to help with this -We also talked about using salt and baking soda mouthwashes  FOLLOW UP: Labs today Will call patient with results   All of the patients questions were answered with apparent satisfaction. The patient knows to call the clinic with any problems, questions or concerns.   The total time spent in the appointment was 20 minutes and more than 50% was on counseling and direct patient cares.    Sullivan Lone MD [place] AAHIVMS [facility] Hematology/Oncology Physician [facility]  (Office):       [phone] (Work cell):  [phone] (Fax):           [phone]  10/16/2021 8:59 AM   I, Reinaldo Raddle, am acting as scribe for Dr. Sullivan Lone, MD.   .I have reviewed the above documentation for accuracy and completeness, and I agree with the above. Brunetta Genera MD    ADDENDUM  Flow cytometry- DIAGNOSIS:   -Abnormal B-cell population  identified  -See comment   COMMENT:   Flow cytometric analysis of the lymphoid population shows a major  population of abnormal B cells expressing pan B-cell antigens including  CD20 associated with CD5 and CD200 expression.  Surface immunoglobulin  light chain expression is dim/negative.  Nonetheless, the findings are  consistent with a B-cell lymphoproliferative process.  Since the number  of abnormal B cells is less than 5 K/uL, the findings may represent  monoclonal B-cell lymphocytosis especially in the absence of  organomegaly, lymphadenopathy or extra medullary tissue involvement.  Clinical correlation is recommended.   ADDENDUM  Called and discussed flow cytometry results with Brenda [name].  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  We will plan to see her back with labs in 6 months and plan to get whole-body CT scan to rule out any presence of lymphadenopathy or splenomegaly which would qualify  her for a diagnosis of CLL. At this time she has less than 5000 clonal lymphocytes which does not meet the criteria for CLL and is consistent with monoclonal B lymphocytosis. No indication for any treatment at this time.  Brunetta Genera MD

## 2021-10-07 NOTE — Progress Notes (Addendum)
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 Labs done 09/18/2021 show CBC with a hemoglobin of 14.8, platelets of 309k and WBC count of 11.1k with lymphocytes of 6k CMP within normal limits  LDH within normal limits at 156  CT chest abdomen pelvis done on 09/18/2021 showed no adenopathy, splenic lesions or specific indicators of active malignancy. 2.5 cm heterogeneously enhancing nodule of the thyroid isthmus.  Patient notes no fevers no chills no night sweats no new lumps or bumps.Marland Kitchen     MEDICAL HISTORY:  Past Medical History:  Diagnosis Date   Anxiety    Arthritis    CLL (chronic lymphocytic leukemia) (HCC)    Complication of anesthesia    Depression    Depression with anxiety    Diverticulosis    Dyspnea    GERD (gastroesophageal reflux disease)    Hearing loss    Memory loss    Mild cognitive impairment    Osteopenia    PONV (postoperative nausea and vomiting)    Reflux    SOB (shortness of breath)     SURGICAL HISTORY: Past Surgical History:  Procedure Laterality Date   ABDOMINAL HYSTERECTOMY     APPENDECTOMY     BREAST EXCISIONAL BIOPSY Left    CARDIAC EVENT MONITOR  02/03/2005   30 days, normal event monitor   CATARACT EXTRACTION Bilateral    EYE SURGERY     squamous cell carcinoma surgery in right eye lid.   KNEE ARTHROSCOPY Right 07/02/2016   Procedure: ARTHROSCOPY KNEE WITH DEBRIDEMENT, PARTIAL LATERAL AND MEDIAL MENISCECTOMY, with drainage of cyst;  Surgeon: Susa Day, MD;  Location: [facility];  Service: Orthopedics;  Laterality:  Right;   NM GATED MYOCARDIAL STUDY (ARMX HX)  01/23/2011   Normal pattern of perfusion in all regions. post-stress EF is 85%. EKG negative for ischemia. no ECG changes.   TONSILLECTOMY     TRANSTHORACIC ECHOCARDIOGRAM  10/31/2010   EF >55%, there is concern for inferoacpical infarct with possible inferoapical aneurysm or pseudoaneurysm - could be echo artificat    SOCIAL HISTORY: Social History   Socioeconomic History   Marital status: Married    Spouse name: [name]   Number of children: 2   Years of education: college   Highest education level: Not on file  Occupational History   Not on file  Tobacco Use   Smoking status: Never   Smokeless tobacco: Never  Vaping Use   Vaping Use: Never used  Substance and Sexual Activity   Alcohol use: No   Drug use: No   Sexual activity: Not on file  Other Topics Concern   Not on file  Social History Narrative   Patient lives at home with husband [name].    Patient is retired.    Patient has a college education.    Patient has 2 children.    Patient drinks 2 cups of coffee daily.    Social Determinants of Health   Financial Resource Strain: Not on file  Food Insecurity: Not on file  Transportation Needs: Not on file  Physical Activity: Not on file  Stress: Not  on file  Social Connections: Not on file  Intimate Partner Violence: Not on file    FAMILY HISTORY: Family History  Problem Relation Age of Onset   Alzheimer's disease Father    Dementia Father    Atrial fibrillation Brother    Dementia Maternal Grandmother    Dementia Paternal Grandmother     ALLERGIES:  is allergic to azithromycin, valium [diazepam], adhesive [tape], codeine, doxycycline, and sulfa antibiotics.  MEDICATIONS:  Current Outpatient Medications  Medication Sig Dispense Refill   acetaminophen (TYLENOL) 500 MG tablet Take 500 mg by mouth every 6 (six) hours as needed for moderate pain.     atorvastatin (LIPITOR) 20 MG tablet Take 20 mg by mouth  daily.     cetirizine (ZYRTEC) 10 MG tablet Take 1 tablet (10 mg total) by mouth daily. 30 tablet 5   cycloSPORINE (RESTASIS) 0.05 % ophthalmic emulsion Place 1 drop into both eyes 2 (two) times daily.     desvenlafaxine (PRISTIQ) 50 MG 24 hr tablet Take 50 mg by mouth daily.     estradiol (ESTRING) 2 MG vaginal ring Place 2 mg vaginally every 3 (three) months. follow package directions     gabapentin (NEURONTIN) 100 MG capsule Take 2 capsules (200 mg total) by mouth 3 (three) times daily for 7 days. 42 capsule 0   LORazepam (ATIVAN) 0.5 MG tablet Take 0.5 mg by mouth at bedtime.      metoCLOPramide (REGLAN) 10 MG tablet Take 1 tablet (10 mg total) by mouth every 6 (six) hours as needed for nausea or vomiting. (Patient not taking: No sig reported) 12 tablet 0   Multiple Vitamin (MULTIVITAMIN) capsule Take 1 capsule by mouth daily.     Multiple Vitamins-Minerals (PRESERVISION AREDS 2+MULTI VIT PO) Take 1 capsule by mouth daily.     MYRBETRIQ 50 MG TB24 tablet Take 50 mg by mouth daily.     Olopatadine HCl (PATADAY) 0.2 % SOLN Place 1 drop into both eyes 1 day or 1 dose. 2.5 mL 5   ondansetron (ZOFRAN-ODT) 8 MG disintegrating tablet Take 8 mg by mouth every 8 (eight) hours as needed for nausea or vomiting. (Patient not taking: Reported on 04/03/2021)     pantoprazole (PROTONIX) 40 MG tablet Take 40 mg by mouth daily.     solifenacin (VESICARE) 5 MG tablet Take 5 mg by mouth daily.     ZETIA 10 MG tablet Take 10 mg by mouth at bedtime. (Patient not taking: Reported on 04/03/2021)     zolpidem (AMBIEN) 5 MG tablet Take 5 mg by mouth at bedtime.     No current facility-administered medications for this visit.    REVIEW OF SYSTEMS:    10 Point review of Systems was done is negative except as noted above.  PHYSICAL EXAMINATION: ECOG PERFORMANCE STATUS: 1 - Symptomatic but completely ambulatory  . Vitals:   10/01/21 1410  BP: (!) 146/75  Pulse: 86  Resp: 18  Temp: (!) 97.3 F (36.3 C)  SpO2:  97%   Filed Weights   10/01/21 1410  Weight: 176 lb (79.8 kg)   .Body mass index is 28.41 kg/m. NAD GENERAL:alert, in no acute distress and comfortable SKIN: no acute rashes, no significant lesions EYES: conjunctiva are pink and non-injected, sclera anicteric OROPHARYNX: MMM, no exudates, no oropharyngeal erythema or ulceration NECK: supple, no JVD LYMPH:  no palpable lymphadenopathy in the cervical, axillary or inguinal regions LUNGS: clear to auscultation b/l with normal respiratory effort HEART: regular rate & rhythm ABDOMEN:  normoactive bowel sounds , non tender, not distended. Extremity: no pedal edema PSYCH: alert & oriented x 3 with fluent speech NEURO: no focal motor/sensory deficits  LABORATORY DATA:  I have reviewed the data as listed  . CBC Latest Ref Rng & Units 09/18/2021 03/26/2021 03/26/2021  WBC 4.0 - 10.5 K/uL 11.1(H) 16.8(H) -  Hemoglobin 12.0 - 15.0 g/dL 14.8 14.6 -  Hematocrit 36.0 - 46.0 % 44.8 43.9 43.4  Platelets 150 - 400 K/uL 309 361 -   .CBC    Component Value Date/Time   WBC 11.1 (H) 09/18/2021 0959   WBC 8.8 10/23/2020 0433   RBC 4.96 09/18/2021 0959   HGB 14.8 09/18/2021 0959   HGB 14.8 02/04/2010 1423   HCT 44.8 09/18/2021 0959   HCT 43.9 03/26/2021 1416   HCT 42.8 02/04/2010 1423   PLT 309 09/18/2021 0959   PLT 272 02/04/2010 1423   MCV 90.3 09/18/2021 0959   MCV 99.3 (A) 08/19/2013 1223   MCV 93.2 02/04/2010 1423   MCH 29.8 09/18/2021 0959   MCHC 33.0 09/18/2021 0959   RDW 13.7 09/18/2021 0959   RDW 13.4 02/04/2010 1423   LYMPHSABS 6.0 (H) 09/18/2021 0959   LYMPHSABS 7.6 (H) 02/04/2010 1423   MONOABS 0.7 09/18/2021 0959   MONOABS 0.5 02/04/2010 1423   EOSABS 0.1 09/18/2021 0959   EOSABS 0.1 02/04/2010 1423   BASOSABS 0.1 09/18/2021 0959   BASOSABS 0.0 02/04/2010 1423     . CMP Latest Ref Rng & Units 09/18/2021 03/26/2021 10/23/2020  Glucose 70 - 99 mg/dL 104(H) 98 103(H)  BUN 8 - 23 mg/dL 14 14 7(L)  Creatinine 0.44 - 1.00  mg/dL 0.72 0.79 0.58  Sodium 135 - 145 mmol/L 137 138 134(L)  Potassium 3.5 - 5.1 mmol/L 4.0 4.3 3.8  Chloride 98 - 111 mmol/L 101 103 105  CO2 22 - 32 mmol/L 29 27 22   Calcium 8.9 - 10.3 mg/dL 9.2 9.7 8.1(L)  Total Protein 6.5 - 8.1 g/dL 6.7 6.8 -  Total Bilirubin 0.3 - 1.2 mg/dL 0.6 0.4 -  Alkaline Phos 38 - 126 U/L 60 71 -  AST 15 - 41 U/L 21 19 -  ALT 0 - 44 U/L 20 17 -   . Lab Results  Component Value Date   LDH 166 09/18/2021    Surgical Pathology  CASE: [id number]  PATIENT: Koi Tarquinio  Flow Pathology Report      Clinical history: lymphocytosis      DIAGNOSIS:   -Abnormal B-cell population identified  -See comment   COMMENT:   Flow cytometric analysis of the lymphoid population shows a major  population of abnormal B cells expressing pan B-cell antigens including  CD20 associated with CD5 and CD200 expression.  Surface immunoglobulin  light chain expression is dim/negative.  Nonetheless, the findings are  consistent with a B-cell lymphoproliferative process.  Since the number  of abnormal B cells is less than 5 K/uL, the findings may represent  monoclonal B-cell lymphocytosis especially in the absence of  organomegaly, lymphadenopathy or extra medullary tissue involvement.  Clinical correlation is recommended.   RADIOGRAPHIC STUDIES: I have personally reviewed the radiological images as listed and agreed with the findings in the report. 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 EXAM: CT CHEST, ABDOMEN, AND PELVIS WITH CONTRAST TECHNIQUE: Multidetector CT imaging of the chest, abdomen and pelvis was performed following the standard protocol during bolus administration of  intravenous contrast. RADIATION DOSE REDUCTION: This exam was performed according to the departmental dose-optimization program which includes automated exposure control,  adjustment of the mA and/or kV according to patient size and/or use of iterative reconstruction technique. CONTRAST:  143mL OMNIPAQUE IOHEXOL 300 MG/ML  SOLN COMPARISON:  Multiple exams, including 10/19/2020 FINDINGS: CT CHEST FINDINGS Cardiovascular: Atherosclerotic calcification of the thoracic aorta and branch vessels. Mediastinum/Nodes: 2.5 by 2.3 by 1.8 cm solid heterogeneously enhancing nodule of the thyroid isthmus. Small calcified subcarinal and left infrahilar lymph nodes compatible with old granulomatous disease. Moderate-sized type 3 hiatal hernia. Lungs/Pleura: Mild anteromedial scarring versus subsegmental atelectasis in the right upper lobe, right middle lobe, and right lower lobe. Musculoskeletal: Unremarkable CT ABDOMEN PELVIS FINDINGS Hepatobiliary: 0.8 by 0.6 cm hypodense lesion medially in the right hepatic lobe on image 52 of series 2, stable. 0.3 cm hypodense lesion peripherally in the right hepatic lobe also on image 52 of series 2, stable. These are likely small cysts although technically nonspecific due to small size, but both are stable from 2012. Common bile duct 0.8 cm in diameter, within normal limits for age. Pancreas: Unremarkable Spleen: Punctate calcifications in the spleen compatible with old granulomatous disease. The spleen measures 7.8 by 5.8 by 5.6 cm (volume = 130 cm^3), within normal limits. No worrisome splenic lesion. Adrenals/Urinary Tract: Bilateral peripelvic cysts. No hydronephrosis or hydroureter. No urinary tract calculi. Adrenal glands unremarkable. Stomach/Bowel: Hiatal hernia is noted above. Mildly mobile cecum near the midline. Vascular/Lymphatic: Atherosclerosis is present, including aortoiliac atherosclerotic disease. No pathologic adenopathy is identified. Reproductive: Uterus absent.  Vaginal ring device noted. Other: No supplemental non-categorized findings. Musculoskeletal: Grade 1 degenerative retrolisthesis at L2-3 and L3-4 with grade 1 anterolisthesis at  L5-S1. In conjunction with lumbar spondylosis and degenerative disc disease there is multilevel lumbar impingement. Small umbilical hernia contains adipose tissue. IMPRESSION: 1. No adenopathy, splenic lesions, or specific indicator of active malignancy. 2. 2.5 cm heterogeneously enhancing nodule of the thyroid isthmus. Recommend thyroid US if clinically warranted given patient age. Reference: J Am Coll Radiol. 2015 Feb;12(2): 143-50 3. Other imaging findings of potential clinical significance: Aortic Atherosclerosis (ICD10-I70.0). Old granulomatous disease. Moderate-sized type 3 hiatal hernia. Multilevel lumbar impingement. Electronically Signed   By: Van Clines M.D.   On: 09/18/2021 17:12    ASSESSMENT & PLAN:   88 year old female referred to [place] by Dr. 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  PLAN: -I discussed Labs done 09/18/2021 show CBC with a hemoglobin of 14.8, platelets of 309k and WBC count of 11.1k with lymphocytes of 6k CMP within normal limits  LDH within normal limits at 156  CT chest abdomen pelvis done on 09/18/2021 showed no adenopathy, splenic lesions or specific indicators of active malignancy. 2.5 cm heterogeneously enhancing nodule of the thyroid isthmus.  -Given patient's labs, absence of lymphadenopathy and hepatosplenomegaly and absence of other constitutional symptoms confirms that this is likely monoclonal B lymphocytosis and not overt CLL at this time. -Diagnosis natural history were discussed in details. -No indication for additional work-up or treatment of the patient's monoclonal B lymphocytosis at this time. -We discussed incidental finding of heterogenous thyroid nodule patient is agreeable to getting an ultrasound of the thyroid to evaluate this for  further. -Depending on ultrasound results patient might need referral to endocrinology for further evaluation and management of her thyroid nodule.  Orders Placed This Encounter  Procedures   US THYROID    Standing Status:   Future    Standing Expiration Date:   10/16/2022    Order Specific Question:   Reason for Exam (SYMPTOM  OR DIAGNOSIS REQUIRED)    Answer:   Further evaluation of heterogeneous thyroid nodule noted on recent CT scan    Order Specific Question:   Preferred imaging location?    Answer:   [facility]     #2 . Past Medical History:  Diagnosis Date   Anxiety    Arthritis    CLL (chronic lymphocytic leukemia) (HCC)    Complication of anesthesia    Depression    Depression with anxiety    Diverticulosis    Dyspnea    GERD (gastroesophageal reflux disease)    Hearing loss    Memory loss    Mild cognitive impairment    Osteopenia    PONV (postoperative nausea and vomiting)    Reflux    SOB (shortness of breath)    -MX of chronic medical issues per PCP  FOLLOW UP: US thyroid in 1-2 weeks Phone visit with Dr Irene Limbo in 4 weeks  All of the patients questions were answered with apparent satisfaction. The patient knows to call the clinic with any problems, questions or concerns.  Sullivan Lone MD [place] AAHIVMS [facility] [place] Hematology/Oncology Physician [facility]

## 2021-10-16 DIAGNOSIS — H04123 Dry eye syndrome of bilateral lacrimal glands: Secondary | ICD-10-CM | POA: Diagnosis not present

## 2021-10-16 NOTE — Addendum Note (Signed)
Addended by: Sullivan Lone on: 10/16/2021 09:38 AM   Modules accepted: Orders

## 2021-10-17 ENCOUNTER — Other Ambulatory Visit: Payer: Self-pay

## 2021-10-17 ENCOUNTER — Ambulatory Visit (HOSPITAL_COMMUNITY)
Admission: RE | Admit: 2021-10-17 | Discharge: 2021-10-17 | Disposition: A | Discharge status: Home / Self Care | Payer: Medicare Other | Source: Ambulatory Visit | Attending: Hematology | Admitting: Hematology

## 2021-10-17 DIAGNOSIS — E041 Nontoxic single thyroid nodule: Secondary | ICD-10-CM | POA: Diagnosis not present

## 2021-10-28 ENCOUNTER — Inpatient Hospital Stay (HOSPITAL_BASED_OUTPATIENT_CLINIC_OR_DEPARTMENT_OTHER): Payer: Medicare Other | Admitting: Hematology

## 2021-10-28 ENCOUNTER — Other Ambulatory Visit: Payer: Self-pay

## 2021-10-28 VITALS — BP 136/85 | HR 88 | Temp 97.9°F | Resp 18 | Ht 66.0 in

## 2021-10-28 DIAGNOSIS — E785 Hyperlipidemia, unspecified: Secondary | ICD-10-CM | POA: Diagnosis not present

## 2021-10-28 DIAGNOSIS — J449 Chronic obstructive pulmonary disease, unspecified: Secondary | ICD-10-CM | POA: Diagnosis not present

## 2021-10-28 DIAGNOSIS — D72820 Lymphocytosis (symptomatic): Secondary | ICD-10-CM | POA: Diagnosis not present

## 2021-10-28 DIAGNOSIS — C9110 Chronic lymphocytic leukemia of B-cell type not having achieved remission: Secondary | ICD-10-CM | POA: Diagnosis not present

## 2021-10-28 DIAGNOSIS — K219 Gastro-esophageal reflux disease without esophagitis: Secondary | ICD-10-CM | POA: Diagnosis not present

## 2021-10-28 DIAGNOSIS — Z79899 Other long term (current) drug therapy: Secondary | ICD-10-CM | POA: Diagnosis not present

## 2021-10-28 DIAGNOSIS — E041 Nontoxic single thyroid nodule: Secondary | ICD-10-CM | POA: Diagnosis not present

## 2021-10-28 NOTE — Progress Notes (Signed)
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 female came in for her clinic visit instead of phone visit review ultrasound of the thyroid done for her thyroid nodule noted on recent CT scan. No acute new symptoms. Ultrasound of the thyroid showed moderately heterogenous mildly atrophic thyroid gland.  Solid nodule in the isthmus 3.2 cm technically meets criteria for tissue sampling.  Second solid nodule in the isthmus measuring 1.3 cm meets criteria for 1 year ultrasound surveillance. Patient was given referral to endocrinology for further evaluation.  MEDICAL HISTORY:  Past Medical History:  Diagnosis Date   Anxiety    Arthritis    CLL (chronic lymphocytic leukemia) (HCC)    Complication of anesthesia    Depression    Depression with anxiety    Diverticulosis    Dyspnea    GERD (gastroesophageal reflux disease)    Hearing loss    Memory loss    Mild cognitive impairment    Osteopenia    PONV (postoperative nausea and vomiting)    Reflux    SOB (shortness of breath)     SURGICAL HISTORY: Past Surgical History:  Procedure Laterality Date   ABDOMINAL HYSTERECTOMY     APPENDECTOMY     BREAST EXCISIONAL BIOPSY Left    CARDIAC EVENT MONITOR  02/03/2005   30 days, normal event monitor   CATARACT EXTRACTION Bilateral    EYE SURGERY     squamous cell carcinoma surgery in right eye lid.   KNEE ARTHROSCOPY Right 07/02/2016   Procedure: ARTHROSCOPY KNEE WITH DEBRIDEMENT, PARTIAL LATERAL AND MEDIAL MENISCECTOMY, with drainage of cyst;  Surgeon: Susa Day, MD;  Location: [facility];  Service: Orthopedics;  Laterality: Right;   NM GATED MYOCARDIAL  STUDY (ARMX HX)  01/23/2011   Normal pattern of perfusion in all regions. post-stress EF is 85%. EKG negative for ischemia. no ECG changes.   TONSILLECTOMY     TRANSTHORACIC ECHOCARDIOGRAM  10/31/2010   EF >55%, there is concern for inferoacpical infarct with possible inferoapical aneurysm or pseudoaneurysm - could be echo artificat    SOCIAL HISTORY: Social History   Socioeconomic History   Marital status: Married    Spouse name: [name]   Number of children: 2   Years of education: college   Highest education level: Not on file  Occupational History   Not on file  Tobacco Use   Smoking status: Never   Smokeless tobacco: Never  Vaping Use   Vaping Use: Never used  Substance and Sexual Activity   Alcohol use: No   Drug use: No   Sexual activity: Not on file  Other Topics Concern   Not on file  Social History Narrative   Patient lives at home with husband [name].    Patient is retired.    Patient has a college education.    Patient has 2 children.    Patient drinks 2 cups of coffee daily.    Social Determinants of Health   Financial Resource Strain: Not on file  Food Insecurity: Not on file  Transportation Needs: Not on file  Physical Activity: Not on file  Stress: Not on file  Social Connections: Not on file  Intimate Partner Violence:  Not on file    FAMILY HISTORY: Family History  Problem Relation Age of Onset   Alzheimer's disease Father    Dementia Father    Atrial fibrillation Brother    Dementia Maternal Grandmother    Dementia Paternal Grandmother     ALLERGIES:  is allergic to azithromycin, valium [diazepam], adhesive [tape], codeine, doxycycline, and sulfa antibiotics.  MEDICATIONS:  Current Outpatient Medications  Medication Sig Dispense Refill   acetaminophen (TYLENOL) 500 MG tablet Take 500 mg by mouth every 6 (six) hours as needed for moderate pain.     atorvastatin (LIPITOR) 20 MG tablet Take 20 mg by mouth daily.     cetirizine (ZYRTEC) 10  MG tablet Take 1 tablet (10 mg total) by mouth daily. 30 tablet 5   cycloSPORINE (RESTASIS) 0.05 % ophthalmic emulsion Place 1 drop into both eyes 2 (two) times daily.     desvenlafaxine (PRISTIQ) 50 MG 24 hr tablet Take 50 mg by mouth daily.     estradiol (ESTRING) 2 MG vaginal ring Place 2 mg vaginally every 3 (three) months. follow package directions     gabapentin (NEURONTIN) 100 MG capsule Take 2 capsules (200 mg total) by mouth 3 (three) times daily for 7 days. 42 capsule 0   LORazepam (ATIVAN) 0.5 MG tablet Take 0.5 mg by mouth at bedtime.      metoCLOPramide (REGLAN) 10 MG tablet Take 1 tablet (10 mg total) by mouth every 6 (six) hours as needed for nausea or vomiting. (Patient not taking: No sig reported) 12 tablet 0   Multiple Vitamin (MULTIVITAMIN) capsule Take 1 capsule by mouth daily.     Multiple Vitamins-Minerals (PRESERVISION AREDS 2+MULTI VIT PO) Take 1 capsule by mouth daily.     MYRBETRIQ 50 MG TB24 tablet Take 50 mg by mouth daily.     Olopatadine HCl (PATADAY) 0.2 % SOLN Place 1 drop into both eyes 1 day or 1 dose. 2.5 mL 5   ondansetron (ZOFRAN-ODT) 8 MG disintegrating tablet Take 8 mg by mouth every 8 (eight) hours as needed for nausea or vomiting. (Patient not taking: Reported on 04/03/2021)     pantoprazole (PROTONIX) 40 MG tablet Take 40 mg by mouth daily.     solifenacin (VESICARE) 5 MG tablet Take 5 mg by mouth daily.     ZETIA 10 MG tablet Take 10 mg by mouth at bedtime. (Patient not taking: Reported on 04/03/2021)     zolpidem (AMBIEN) 5 MG tablet Take 5 mg by mouth at bedtime.     No current facility-administered medications for this visit.    REVIEW OF SYSTEMS:   No neck pain.  No dysphagia.  No shortness of breath.  PHYSICAL EXAMINATION: ECOG PERFORMANCE STATUS: 1 - Symptomatic but completely ambulatory  . Vitals:   10/28/21 1533  BP: 136/85  Pulse: 88  Resp: 18  Temp: 97.9 F (36.6 C)  SpO2: 100%    Filed Weights    .Body mass index is 28.41  kg/m. NAD GENERAL:alert, in no acute distress and comfortable SKIN: no acute rashes, no significant lesions NECK: supple, no JVD LYMPH:  no palpable lymphadenopathy in the cervical, axillary or inguinal regions LUNGS: clear to auscultation b/l with normal respiratory effort HEART: regular rate & rhythm ABDOMEN:  normoactive bowel sounds , non tender, not distended. Extremity: no pedal edema    LABORATORY DATA:  I have reviewed the data as listed  . CBC Latest Ref Rng & Units 09/18/2021 03/26/2021 03/26/2021  WBC 4.0 - 10.5  K/uL 11.1(H) 16.8(H) -  Hemoglobin 12.0 - 15.0 g/dL 14.8 14.6 -  Hematocrit 36.0 - 46.0 % 44.8 43.9 43.4  Platelets 150 - 400 K/uL 309 361 -   .CBC    Component Value Date/Time   WBC 11.1 (H) 09/18/2021 0959   WBC 8.8 10/23/2020 0433   RBC 4.96 09/18/2021 0959   HGB 14.8 09/18/2021 0959   HGB 14.8 02/04/2010 1423   HCT 44.8 09/18/2021 0959   HCT 43.9 03/26/2021 1416   HCT 42.8 02/04/2010 1423   PLT 309 09/18/2021 0959   PLT 272 02/04/2010 1423   MCV 90.3 09/18/2021 0959   MCV 99.3 (A) 08/19/2013 1223   MCV 93.2 02/04/2010 1423   MCH 29.8 09/18/2021 0959   MCHC 33.0 09/18/2021 0959   RDW 13.7 09/18/2021 0959   RDW 13.4 02/04/2010 1423   LYMPHSABS 6.0 (H) 09/18/2021 0959   LYMPHSABS 7.6 (H) 02/04/2010 1423   MONOABS 0.7 09/18/2021 0959   MONOABS 0.5 02/04/2010 1423   EOSABS 0.1 09/18/2021 0959   EOSABS 0.1 02/04/2010 1423   BASOSABS 0.1 09/18/2021 0959   BASOSABS 0.0 02/04/2010 1423     . CMP Latest Ref Rng & Units 09/18/2021 03/26/2021 10/23/2020  Glucose 70 - 99 mg/dL 104(H) 98 103(H)  BUN 8 - 23 mg/dL 14 14 7(L)  Creatinine 0.44 - 1.00 mg/dL 0.72 0.79 0.58  Sodium 135 - 145 mmol/L 137 138 134(L)  Potassium 3.5 - 5.1 mmol/L 4.0 4.3 3.8  Chloride 98 - 111 mmol/L 101 103 105  CO2 22 - 32 mmol/L 29 27 22   Calcium 8.9 - 10.3 mg/dL 9.2 9.7 8.1(L)  Total Protein 6.5 - 8.1 g/dL 6.7 6.8 -  Total Bilirubin 0.3 - 1.2 mg/dL 0.6 0.4 -  Alkaline Phos  38 - 126 U/L 60 71 -  AST 15 - 41 U/L 21 19 -  ALT 0 - 44 U/L 20 17 -   . Lab Results  Component Value Date   LDH 166 09/18/2021    Surgical Pathology  CASE: [id number]  PATIENT: [facility]afalda [facility]le  Flow Pathology Report      Clinical history: lymphocytosis      DIAGNOSIS:   -Abnormal B-cell population identified  -See comment   COMMENT:   Flow cytometric analysis of the lymphoid population shows a major  population of abnormal B cells expressing pan B-cell antigens including  CD20 associated with CD5 and CD200 expression.  Surface immunoglobulin  light chain expression is dim/negative.  Nonetheless, the findings are  consistent with a B-cell lymphoproliferative process.  Since the number  of abnormal B cells is less than 5 K/uL, the findings may represent  monoclonal B-cell lymphocytosis especially in the absence of  organomegaly, lymphadenopathy or extra medullary tissue involvement.  Clinical correlation is recommended.   RADIOGRAPHIC STUDIES: I have personally reviewed the radiological images as listed and agreed with the findings in the report. US THYROID  Result Date: 10/17/2021 CLINICAL DATA:  Incidental on CT. EXAM: THYROID ULTRASOUND TECHNIQUE: Ultrasound examination of the thyroid gland and adjacent soft tissues was performed. COMPARISON:  09/18/2021 FINDINGS: Parenchymal Echotexture: Moderately heterogenous Isthmus: 0.5 cm Right lobe: 3.8 x 0.9 x 1.0 cm Left lobe: 2.2 x 0.7 x 1.1 cm _________________________________________________________ Estimated total number of nodules >/= 1 cm: 2 Number of spongiform nodules >/=  2 cm not described below (TR1): 0 Number of mixed cystic and solid nodules >/= 1.5 cm not described below (TR2): 0 _________________________________________________________ Nodule # 1: Location: Isthmus; Mid Maximum size:  3.2 cm; Other 2 dimensions: 1.6 x 1.3 cm Composition: solid/almost completely solid (2) Echogenicity: isoechoic (1) Shape: not  taller-than-wide (0) Margins: smooth (0) Echogenic foci: none (0) ACR TI-RADS total points: 3. ACR TI-RADS risk category: TR3 (3 points). ACR TI-RADS recommendations: **Given size (>/= 2.5 cm) and appearance, fine needle aspiration of this mildly suspicious nodule should be considered based on TI-RADS criteria. _________________________________________________________ Nodule # 2: Location: Isthmus; Mid Maximum size: 1.3 cm; Other 2 dimensions: 0.6 x 0.9 cm Composition: solid/almost completely solid (2) Echogenicity: hypoechoic (2) Shape: not taller-than-wide (0) Margins: smooth (0) Echogenic foci: none (0) ACR TI-RADS total points: 4. ACR TI-RADS risk category: TR4 (4-6 points). ACR TI-RADS recommendations: *Given size (>/= 1 - 1.4 cm) and appearance, a follow-up ultrasound in 1 year should be considered based on TI-RADS criteria. _________________________________________________________ No cervical lymphadenopathy. IMPRESSION: 1. Moderately heterogeneous, mildly atrophic thyroid gland. 2. Solid nodule in the isthmus (labeled 1, 3.2 cm) technically meets criteria (TI-RADS category 3) for tissue sampling. 3. Solid nodule in the isthmus (labeled 2, 1.3 cm) meets criteria (TI-RADS category 4) for 1 year ultrasound surveillance. The above is in keeping with the ACR TI-RADS recommendations - J Am Coll Radiol 2017;14:587-595. Ruthann Cancer, MD [facility] Electronically Signed   By: Ruthann Cancer M.D.   On: 10/17/2021 15:13    ASSESSMENT & PLAN:   88 year old female referred to [place] by Dr. 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 2.5 cm heterogeneously enhancing nodule of the thyroid isthmus.  -Given patient's labs, absence of lymphadenopathy and hepatosplenomegaly and absence of other constitutional symptoms confirms that this is likely monoclonal B lymphocytosis and not overt CLL at this time.  PLAN: -No indication for additional work-up for treatment of the patient's monoclonal B lymphocytosis at this time.  #2 incidentally noted thyroid nodules in the isthmus -Ultrasound of the thyroid was reviewed in detail and showed 2 thyroid nodules in the isthmus 1 of which measures 3.2 cm in size and requires tissue sampling.  The other is about 1.3 cm in size and might need tissue sampling versus interval reimaging. Plan -endocrinology referral for evaluation and mx of thyroid nodules  No orders of the defined types were placed in this encounter.    #2 . Past Medical History:  Diagnosis Date   Anxiety    Arthritis    CLL (chronic lymphocytic leukemia) (HCC)    Complication of anesthesia    Depression    Depression with anxiety    Diverticulosis    Dyspnea    GERD (gastroesophageal reflux disease)    Hearing loss    Memory loss    Mild cognitive impairment    Osteopenia    PONV (postoperative nausea and vomiting)    Reflux    SOB (shortness of breath)    -MX of chronic medical issues per PCP  FOLLOW UP: -Endocrinology referral for evaluation and mx of thyroid nodules  The total time spent in the appointment was 12 minutes*.  All of the patient's questions were answered with apparent satisfaction. The patient knows to call the clinic with any problems, questions or concerns.  Sullivan Lone MD MS AAHIVMS [facility] Hematology/Oncology Physician [facility]  .*Total Encounter Time as defined by the Centers for Medicare and Medicaid Services includes, in addition to the face-to-face time of a patient visit (documented in the note above)  non-face-to-face time: obtaining and reviewing outside history, ordering and reviewing medications, tests or procedures, care coordination (communications with other health care professionals or caregivers) and documentation in the medical record.

## 2021-11-28 DIAGNOSIS — N3946 Mixed incontinence: Secondary | ICD-10-CM | POA: Diagnosis not present

## 2021-11-28 DIAGNOSIS — N3020 Other chronic cystitis without hematuria: Secondary | ICD-10-CM | POA: Diagnosis not present

## 2021-12-03 DIAGNOSIS — L821 Other seborrheic keratosis: Secondary | ICD-10-CM | POA: Diagnosis not present

## 2021-12-03 DIAGNOSIS — Z85828 Personal history of other malignant neoplasm of skin: Secondary | ICD-10-CM | POA: Diagnosis not present

## 2021-12-03 DIAGNOSIS — L853 Xerosis cutis: Secondary | ICD-10-CM | POA: Diagnosis not present

## 2022-01-01 DIAGNOSIS — K121 Other forms of stomatitis: Secondary | ICD-10-CM | POA: Diagnosis not present

## 2022-01-01 DIAGNOSIS — K1379 Other lesions of oral mucosa: Secondary | ICD-10-CM | POA: Diagnosis not present

## 2022-01-13 ENCOUNTER — Other Ambulatory Visit: Payer: Self-pay | Admitting: Hematology

## 2022-01-13 ENCOUNTER — Telehealth: Payer: Self-pay

## 2022-01-13 DIAGNOSIS — R300 Dysuria: Secondary | ICD-10-CM | POA: Diagnosis not present

## 2022-01-13 DIAGNOSIS — E041 Nontoxic single thyroid nodule: Secondary | ICD-10-CM

## 2022-01-13 NOTE — Progress Notes (Signed)
Endocrinology referral to Dr [facility] placed for evaluation fo thyroid nodules ? ?

## 2022-01-13 NOTE — Telephone Encounter (Signed)
Spoke with pt regarding her concerns of needing a biopsy done on her thyroid. This LPN asked if she has heard from her PCP or anyone about a referral and she states no. Dr. Irene Limbo is aware and has put in a referral order for [facility] for Dr. Elisabeth Most. This LPN faxed a referral to # [phone] and pt is aware she should receive a call sometime soon from their office. No more questions or concerns.  ?

## 2022-01-20 DIAGNOSIS — H04123 Dry eye syndrome of bilateral lacrimal glands: Secondary | ICD-10-CM | POA: Diagnosis not present

## 2022-01-23 ENCOUNTER — Other Ambulatory Visit: Payer: Self-pay

## 2022-01-23 DIAGNOSIS — E041 Nontoxic single thyroid nodule: Secondary | ICD-10-CM

## 2022-01-23 NOTE — Progress Notes (Signed)
Referral faxed to Dr Louanna Raw Endocrinology per Dr Irene Limbo.

## 2022-01-30 DIAGNOSIS — E042 Nontoxic multinodular goiter: Secondary | ICD-10-CM | POA: Diagnosis not present

## 2022-01-30 DIAGNOSIS — R9389 Abnormal findings on diagnostic imaging of other specified body structures: Secondary | ICD-10-CM | POA: Diagnosis not present

## 2022-02-04 DIAGNOSIS — R9389 Abnormal findings on diagnostic imaging of other specified body structures: Secondary | ICD-10-CM | POA: Diagnosis not present

## 2022-02-04 DIAGNOSIS — E042 Nontoxic multinodular goiter: Secondary | ICD-10-CM | POA: Diagnosis not present

## 2022-02-05 ENCOUNTER — Other Ambulatory Visit: Payer: Self-pay | Admitting: Internal Medicine

## 2022-02-05 DIAGNOSIS — E042 Nontoxic multinodular goiter: Secondary | ICD-10-CM

## 2022-02-10 DIAGNOSIS — H04123 Dry eye syndrome of bilateral lacrimal glands: Secondary | ICD-10-CM | POA: Diagnosis not present

## 2022-02-12 ENCOUNTER — Other Ambulatory Visit (HOSPITAL_COMMUNITY)
Admission: RE | Admit: 2022-02-12 | Discharge: 2022-02-12 | Disposition: A | Discharge status: Home / Self Care | Payer: Medicare Other | Source: Ambulatory Visit | Attending: Internal Medicine | Admitting: Internal Medicine

## 2022-02-12 ENCOUNTER — Ambulatory Visit
Admission: RE | Admit: 2022-02-12 | Discharge: 2022-02-12 | Disposition: A | Discharge status: Home / Self Care | Payer: Medicare Other | Source: Ambulatory Visit | Attending: Internal Medicine | Admitting: Internal Medicine

## 2022-02-12 DIAGNOSIS — E042 Nontoxic multinodular goiter: Secondary | ICD-10-CM | POA: Diagnosis not present

## 2022-02-12 DIAGNOSIS — Z0389 Encounter for observation for other suspected diseases and conditions ruled out: Secondary | ICD-10-CM | POA: Diagnosis not present

## 2022-02-12 DIAGNOSIS — E041 Nontoxic single thyroid nodule: Secondary | ICD-10-CM | POA: Diagnosis not present

## 2022-02-13 LAB — CYTOLOGY - NON PAP

## 2022-03-05 DIAGNOSIS — H04123 Dry eye syndrome of bilateral lacrimal glands: Secondary | ICD-10-CM | POA: Diagnosis not present

## 2022-03-27 DIAGNOSIS — H04122 Dry eye syndrome of left lacrimal gland: Secondary | ICD-10-CM | POA: Diagnosis not present

## 2022-03-27 DIAGNOSIS — H04121 Dry eye syndrome of right lacrimal gland: Secondary | ICD-10-CM | POA: Diagnosis not present

## 2022-04-02 DIAGNOSIS — C9100 Acute lymphoblastic leukemia not having achieved remission: Secondary | ICD-10-CM | POA: Diagnosis not present

## 2022-04-02 DIAGNOSIS — K219 Gastro-esophageal reflux disease without esophagitis: Secondary | ICD-10-CM | POA: Diagnosis not present

## 2022-04-02 DIAGNOSIS — B370 Candidal stomatitis: Secondary | ICD-10-CM | POA: Diagnosis not present

## 2022-04-02 DIAGNOSIS — R7301 Impaired fasting glucose: Secondary | ICD-10-CM | POA: Diagnosis not present

## 2022-04-02 DIAGNOSIS — K1379 Other lesions of oral mucosa: Secondary | ICD-10-CM | POA: Diagnosis not present

## 2022-04-02 DIAGNOSIS — K146 Glossodynia: Secondary | ICD-10-CM | POA: Diagnosis not present

## 2022-04-02 DIAGNOSIS — E785 Hyperlipidemia, unspecified: Secondary | ICD-10-CM | POA: Diagnosis not present

## 2022-04-02 DIAGNOSIS — K121 Other forms of stomatitis: Secondary | ICD-10-CM | POA: Diagnosis not present

## 2022-04-13 DIAGNOSIS — Z0000 Encounter for general adult medical examination without abnormal findings: Secondary | ICD-10-CM | POA: Diagnosis not present

## 2022-04-13 DIAGNOSIS — R600 Localized edema: Secondary | ICD-10-CM | POA: Diagnosis not present

## 2022-04-13 DIAGNOSIS — E785 Hyperlipidemia, unspecified: Secondary | ICD-10-CM | POA: Diagnosis not present

## 2022-04-13 DIAGNOSIS — C9100 Acute lymphoblastic leukemia not having achieved remission: Secondary | ICD-10-CM | POA: Diagnosis not present

## 2022-04-13 DIAGNOSIS — R7301 Impaired fasting glucose: Secondary | ICD-10-CM | POA: Diagnosis not present

## 2022-04-13 DIAGNOSIS — R011 Cardiac murmur, unspecified: Secondary | ICD-10-CM | POA: Diagnosis not present

## 2022-04-13 DIAGNOSIS — Z1331 Encounter for screening for depression: Secondary | ICD-10-CM | POA: Diagnosis not present

## 2022-04-13 DIAGNOSIS — M48061 Spinal stenosis, lumbar region without neurogenic claudication: Secondary | ICD-10-CM | POA: Diagnosis not present

## 2022-04-13 DIAGNOSIS — Z23 Encounter for immunization: Secondary | ICD-10-CM | POA: Diagnosis not present

## 2022-04-13 DIAGNOSIS — Z1339 Encounter for screening examination for other mental health and behavioral disorders: Secondary | ICD-10-CM | POA: Diagnosis not present

## 2022-04-13 DIAGNOSIS — R82998 Other abnormal findings in urine: Secondary | ICD-10-CM | POA: Diagnosis not present

## 2022-04-13 DIAGNOSIS — R0600 Dyspnea, unspecified: Secondary | ICD-10-CM | POA: Diagnosis not present

## 2022-04-13 DIAGNOSIS — M8580 Other specified disorders of bone density and structure, unspecified site: Secondary | ICD-10-CM | POA: Diagnosis not present

## 2022-04-13 DIAGNOSIS — J449 Chronic obstructive pulmonary disease, unspecified: Secondary | ICD-10-CM | POA: Diagnosis not present

## 2022-04-17 DIAGNOSIS — H04121 Dry eye syndrome of right lacrimal gland: Secondary | ICD-10-CM | POA: Diagnosis not present

## 2022-04-24 DIAGNOSIS — N3020 Other chronic cystitis without hematuria: Secondary | ICD-10-CM | POA: Diagnosis not present

## 2022-05-07 DIAGNOSIS — N3020 Other chronic cystitis without hematuria: Secondary | ICD-10-CM | POA: Diagnosis not present

## 2022-05-08 DIAGNOSIS — B370 Candidal stomatitis: Secondary | ICD-10-CM | POA: Diagnosis not present

## 2022-05-08 DIAGNOSIS — K146 Glossodynia: Secondary | ICD-10-CM | POA: Diagnosis not present

## 2022-05-08 DIAGNOSIS — K1379 Other lesions of oral mucosa: Secondary | ICD-10-CM | POA: Diagnosis not present

## 2022-05-08 DIAGNOSIS — R7301 Impaired fasting glucose: Secondary | ICD-10-CM | POA: Diagnosis not present

## 2022-05-08 DIAGNOSIS — C9100 Acute lymphoblastic leukemia not having achieved remission: Secondary | ICD-10-CM | POA: Diagnosis not present

## 2022-05-08 DIAGNOSIS — K219 Gastro-esophageal reflux disease without esophagitis: Secondary | ICD-10-CM | POA: Diagnosis not present

## 2022-05-19 DIAGNOSIS — B370 Candidal stomatitis: Secondary | ICD-10-CM | POA: Diagnosis not present

## 2022-05-19 DIAGNOSIS — K146 Glossodynia: Secondary | ICD-10-CM | POA: Diagnosis not present

## 2022-05-19 DIAGNOSIS — K149 Disease of tongue, unspecified: Secondary | ICD-10-CM | POA: Diagnosis not present

## 2022-06-04 DIAGNOSIS — D2271 Melanocytic nevi of right lower limb, including hip: Secondary | ICD-10-CM | POA: Diagnosis not present

## 2022-06-04 DIAGNOSIS — L570 Actinic keratosis: Secondary | ICD-10-CM | POA: Diagnosis not present

## 2022-06-04 DIAGNOSIS — H04123 Dry eye syndrome of bilateral lacrimal glands: Secondary | ICD-10-CM | POA: Diagnosis not present

## 2022-06-04 DIAGNOSIS — H10413 Chronic giant papillary conjunctivitis, bilateral: Secondary | ICD-10-CM | POA: Diagnosis not present

## 2022-06-04 DIAGNOSIS — D485 Neoplasm of uncertain behavior of skin: Secondary | ICD-10-CM | POA: Diagnosis not present

## 2022-06-04 DIAGNOSIS — H16223 Keratoconjunctivitis sicca, not specified as Sjogren's, bilateral: Secondary | ICD-10-CM | POA: Diagnosis not present

## 2022-06-04 DIAGNOSIS — L821 Other seborrheic keratosis: Secondary | ICD-10-CM | POA: Diagnosis not present

## 2022-06-04 DIAGNOSIS — Z85828 Personal history of other malignant neoplasm of skin: Secondary | ICD-10-CM | POA: Diagnosis not present

## 2022-06-09 NOTE — Progress Notes (Unsigned)
Name: Brenda [name]  MRN/ DOB: [id number], [birth year]    Age/ Sex: 88 y.o., female    PCP: Crist Infante, MD   Reason for Endocrinology Evaluation: MNG     Date of Initial Endocrinology Evaluation: 06/10/2022     HPI: Brenda [name] is a 88 y.o. female with a past medical history of MNG and GAD. The patient presented for initial endocrinology clinic visit on 06/10/2022 for consultative assistance with her MNG.   Patient had an incidental finding of thyroid nodules on CT scan which prompted thyroid ultrasound.  Patient had thyroid ultrasound in February 2023 revealing multinodular goiter with 3.2 cm isthmic nodule meeting FNA criteria.   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 She follows with oncology for monoclonal B lymphocytosis    HISTORY:  Past Medical History:  Past Medical History:  Diagnosis Date   Anxiety    Arthritis    CLL (chronic lymphocytic leukemia) ([place])    Complication of anesthesia    Depression    Depression with anxiety    Diverticulosis    Dyspnea    GERD (gastroesophageal reflux disease)    Hearing loss    Memory loss    Mild cognitive impairment    Osteopenia    PONV (postoperative nausea and vomiting)    Reflux    SOB (shortness of breath)    Past Surgical History:  Past Surgical History:  Procedure Laterality Date   ABDOMINAL HYSTERECTOMY     APPENDECTOMY     BREAST EXCISIONAL BIOPSY Left    CARDIAC EVENT MONITOR  02/03/2005   30 days, normal event monitor   CATARACT EXTRACTION Bilateral    EYE SURGERY     squamous cell carcinoma surgery in right eye lid.   KNEE ARTHROSCOPY Right 07/02/2016   Procedure: ARTHROSCOPY KNEE WITH DEBRIDEMENT, PARTIAL LATERAL AND MEDIAL MENISCECTOMY, with drainage of cyst;  Surgeon: Susa Day, MD;  Location: [facility];  Service: Orthopedics;  Laterality: Right;   NM GATED MYOCARDIAL STUDY (ARMX HX)  01/23/2011   Normal pattern of perfusion in all regions. post-stress EF is 85%. EKG negative for ischemia. no ECG changes.   TONSILLECTOMY     TRANSTHORACIC ECHOCARDIOGRAM  10/31/2010   EF >55%, there is concern for inferoacpical infarct with possible inferoapical aneurysm or pseudoaneurysm - could be echo artificat    Social History:  reports that she has never smoked. She has never used smokeless tobacco. She reports that she does not drink alcohol and does not use drugs. Family History: family history includes Alzheimer's disease in her father; Atrial fibrillation in her brother; Dementia in her father, maternal grandmother, and paternal grandmother.   HOME MEDICATIONS: Allergies as of 06/10/2022       Reactions   Azithromycin    Valium [diazepam] Other (See Comments)   Disoriented   Adhesive [tape] Rash   Codeine Nausea And Vomiting   Doxycycline Rash   Severe Reflux , cheek "flushing"   Sulfa Antibiotics Rash        Medication List        Accurate as of June 10, 2022  7:35 AM. If you have any questions, ask your nurse or doctor.          STOP  taking these medications    acetaminophen 500 MG tablet Commonly known as: TYLENOL Stopped by: Dorita Sciara, MD   cetirizine 10 MG tablet Commonly known as: ZYRTEC Stopped by: Dorita Sciara, MD   gabapentin 100 MG capsule Commonly known as: NEURONTIN Stopped by: Dorita Sciara, MD   metoCLOPramide 10 MG tablet Commonly known as: Reglan Stopped by: Dorita Sciara, MD   Olopatadine HCl 0.2 % Soln Commonly known as: Pataday Stopped by: Dorita Sciara, MD   ondansetron 8 MG disintegrating tablet Commonly known as: ZOFRAN-ODT Stopped by: Dorita Sciara, MD       TAKE these medications    atorvastatin 20 MG tablet Commonly known as: LIPITOR Take 20 mg by  mouth daily.   cycloSPORINE 0.05 % ophthalmic emulsion Commonly known as: RESTASIS Place 1 drop into both eyes 2 (two) times daily.   desvenlafaxine 50 MG 24 hr tablet Commonly known as: PRISTIQ Take 50 mg by mouth daily.   estradiol 2 MG vaginal ring Commonly known as: ESTRING Place 2 mg vaginally every 3 (three) months. follow package directions   LORazepam 0.5 MG tablet Commonly known as: ATIVAN Take 0.5 mg by mouth at bedtime.   multivitamin capsule Take 1 capsule by mouth daily.   Myrbetriq 50 MG Tb24 tablet Generic drug: mirabegron ER Take 50 mg by mouth daily.   pantoprazole 40 MG tablet Commonly known as: PROTONIX Take 40 mg by mouth daily.   PRESERVISION AREDS 2+MULTI VIT PO Take 1 capsule by mouth daily.   solifenacin 5 MG tablet Commonly known as: VESICARE Take 5 mg by mouth daily.   Zetia 10 MG tablet Generic drug: ezetimibe Take 10 mg by mouth at bedtime.   zolpidem 5 MG tablet Commonly known as: AMBIEN Take 5 mg by mouth at bedtime.          REVIEW OF SYSTEMS: A comprehensive ROS was conducted with the patient and is negative except as per HPI    OBJECTIVE:  VS: BP 122/70 (BP Location: Left Arm, Patient Position: Sitting, Cuff Size: Small)   Pulse 75   Ht '5\' 6"'$  (1.676 m)   Wt 180 lb (81.6 kg)   SpO2 98%   BMI 29.05 kg/m    Wt Readings from Last 3 Encounters:  06/10/22 180 lb (81.6 kg)  10/01/21 176 lb (79.8 kg)  04/03/21 174 lb (78.9 kg)     EXAM: General: Pt appears well and is in NAD  Neck: General: Supple without adenopathy. Thyroid: Isthmic nodule appreciated  Lungs: Clear with good BS bilat with no rales, rhonchi, or wheezes  Heart: Auscultation: RRR.  Abdomen: Normoactive bowel sounds, soft, nontender, without masses or organomegaly palpable  Extremities:  BL LE: No pretibial edema normal ROM and strength.  Mental Status: Judgment, insight: Intact Orientation: Oriented to time, place, and person Mood and affect: No  depression, anxiety, or agitation     DATA REVIEWED:   04/02/2022 TSH 2.780 uIU/mL     Thyroid Ultrasound 10/17/2021    Estimated total number of nodules >/= 1 cm: 2   Number of spongiform nodules >/=  2 cm not described below (TR1): 0   Number of mixed cystic and solid nodules >/= 1.5 cm not described below ([place]): 0   _________________________________________________________   Nodule # 1:   Location: Isthmus; Mid   Maximum size: 3.2 cm; Other 2 dimensions: 1.6 x 1.3 cm   Composition: solid/almost completely solid (2)   Echogenicity: isoechoic (1)  Shape: not taller-than-wide (0)   Margins: smooth (0)   Echogenic foci: none (0)   ACR TI-RADS total points: 3.   ACR TI-RADS risk category: TR3 (3 points).   ACR TI-RADS recommendations:   **Given size (>/= 2.5 cm) and appearance, fine needle aspiration of this mildly suspicious nodule should be considered based on TI-RADS criteria.   _________________________________________________________   Nodule # 2:   Location: Isthmus; Mid   Maximum size: 1.3 cm; Other 2 dimensions: 0.6 x 0.9 cm   Composition: solid/almost completely solid (2)   Echogenicity: hypoechoic (2)   Shape: not taller-than-wide (0)   Margins: smooth (0)   Echogenic foci: none (0)   ACR TI-RADS total points: 4.   ACR TI-RADS risk category: TR4 (4-6 points).   ACR TI-RADS recommendations:   *Given size (>/= 1 - 1.4 cm) and appearance, a follow-up ultrasound in 1 year should be considered based on TI-RADS criteria.   _________________________________________________________   No cervical lymphadenopathy.   IMPRESSION: 1. Moderately heterogeneous, mildly atrophic thyroid gland. 2. Solid nodule in the isthmus (labeled 1, 3.2 cm) technically meets criteria (TI-RADS category 3) for tissue sampling. 3. Solid nodule in the isthmus (labeled 2, 1.3 cm) meets criteria (TI-RADS category 4) for 1 year ultrasound surveillance.      FNA 3.2 cm isthmic nodule 02/12/2022  Clinical History: Nodule #1: Isthmus; Mid, Maximum size: 3.2 cm; Other 2  dimensions: 1.6 x 1.3 cm, solid/almost completely solid, isoechoic,  TI-RADS total points: 3.  Specimen Submitted:  A. THYROID, MID ISTHMUS, FINE NEEDLE ASPIRATION:    FINAL MICROSCOPIC DIAGNOSIS:  - Consistent with benign follicular nodule ([place] category II)     Old records , labs and images have been reviewed.    ASSESSMENT/PLAN/RECOMMENDATIONS:   Multinodular Goiter:  - No local neck symptoms  - Pt is biochemical euthyroid  - she is S/P benign FNA of the isthmic 3.2 cm nodule in 01/2022 - We discussed monitoring thyroid nodules for the next 5 years for stability   F/U in 6 months   Signed electronically by: Mack Guise, MD  [facility] Endocrinology  [facility] [address] [place], [place] [address] Phone: [phone] FAX: [phone]   CC: Crist Infante, [place] [address] Phone: [phone] Fax: [phone]   Return to Endocrinology clinic as below: No future appointments.

## 2022-06-10 ENCOUNTER — Encounter: Payer: Self-pay | Admitting: Internal Medicine

## 2022-06-10 ENCOUNTER — Ambulatory Visit (INDEPENDENT_AMBULATORY_CARE_PROVIDER_SITE_OTHER): Payer: Medicare Other | Admitting: Internal Medicine

## 2022-06-10 VITALS — BP 122/70 | HR 75 | Ht 66.0 in | Wt 180.0 lb

## 2022-06-10 DIAGNOSIS — E042 Nontoxic multinodular goiter: Secondary | ICD-10-CM

## 2022-06-10 NOTE — Patient Instructions (Signed)
We will check your thyroid once a year from now on for the next 5 years

## 2022-06-16 DIAGNOSIS — H16223 Keratoconjunctivitis sicca, not specified as Sjogren's, bilateral: Secondary | ICD-10-CM | POA: Diagnosis not present

## 2022-06-25 DIAGNOSIS — Z23 Encounter for immunization: Secondary | ICD-10-CM | POA: Diagnosis not present

## 2022-07-10 DIAGNOSIS — H0100A Unspecified blepharitis right eye, upper and lower eyelids: Secondary | ICD-10-CM | POA: Diagnosis not present

## 2022-07-10 DIAGNOSIS — H0100B Unspecified blepharitis left eye, upper and lower eyelids: Secondary | ICD-10-CM | POA: Diagnosis not present

## 2022-07-10 DIAGNOSIS — H16223 Keratoconjunctivitis sicca, not specified as Sjogren's, bilateral: Secondary | ICD-10-CM | POA: Diagnosis not present

## 2022-07-13 DIAGNOSIS — B370 Candidal stomatitis: Secondary | ICD-10-CM | POA: Diagnosis not present

## 2022-07-13 DIAGNOSIS — K449 Diaphragmatic hernia without obstruction or gangrene: Secondary | ICD-10-CM | POA: Diagnosis not present

## 2022-07-13 DIAGNOSIS — K219 Gastro-esophageal reflux disease without esophagitis: Secondary | ICD-10-CM | POA: Diagnosis not present

## 2022-07-13 DIAGNOSIS — J449 Chronic obstructive pulmonary disease, unspecified: Secondary | ICD-10-CM | POA: Diagnosis not present

## 2022-07-13 DIAGNOSIS — R053 Chronic cough: Secondary | ICD-10-CM | POA: Diagnosis not present

## 2022-07-31 DIAGNOSIS — H04123 Dry eye syndrome of bilateral lacrimal glands: Secondary | ICD-10-CM | POA: Diagnosis not present

## 2022-08-13 DIAGNOSIS — N3020 Other chronic cystitis without hematuria: Secondary | ICD-10-CM | POA: Diagnosis not present

## 2022-08-29 ENCOUNTER — Emergency Department (HOSPITAL_COMMUNITY)
Admission: EM | Admit: 2022-08-29 | Discharge: 2022-08-29 | Disposition: A | Discharge status: Home / Self Care | Payer: Medicare Other | Attending: Emergency Medicine | Admitting: Emergency Medicine

## 2022-08-29 ENCOUNTER — Emergency Department (HOSPITAL_COMMUNITY): Payer: Medicare Other

## 2022-08-29 DIAGNOSIS — I700 Atherosclerosis of aorta: Secondary | ICD-10-CM | POA: Diagnosis not present

## 2022-08-29 DIAGNOSIS — Y93E1 Activity, personal bathing and showering: Secondary | ICD-10-CM | POA: Insufficient documentation

## 2022-08-29 DIAGNOSIS — M25561 Pain in right knee: Secondary | ICD-10-CM | POA: Diagnosis not present

## 2022-08-29 DIAGNOSIS — W19XXXA Unspecified fall, initial encounter: Secondary | ICD-10-CM | POA: Diagnosis not present

## 2022-08-29 DIAGNOSIS — M533 Sacrococcygeal disorders, not elsewhere classified: Secondary | ICD-10-CM | POA: Insufficient documentation

## 2022-08-29 DIAGNOSIS — W182XXA Fall in (into) shower or empty bathtub, initial encounter: Secondary | ICD-10-CM | POA: Diagnosis not present

## 2022-08-29 DIAGNOSIS — M4316 Spondylolisthesis, lumbar region: Secondary | ICD-10-CM | POA: Diagnosis not present

## 2022-08-29 DIAGNOSIS — M8588 Other specified disorders of bone density and structure, other site: Secondary | ICD-10-CM | POA: Diagnosis not present

## 2022-08-29 DIAGNOSIS — I878 Other specified disorders of veins: Secondary | ICD-10-CM | POA: Diagnosis not present

## 2022-08-29 DIAGNOSIS — M545 Low back pain, unspecified: Secondary | ICD-10-CM | POA: Diagnosis not present

## 2022-08-29 DIAGNOSIS — Y92002 Bathroom of unspecified non-institutional (private) residence single-family (private) house as the place of occurrence of the external cause: Secondary | ICD-10-CM | POA: Diagnosis not present

## 2022-08-29 DIAGNOSIS — N3289 Other specified disorders of bladder: Secondary | ICD-10-CM | POA: Diagnosis not present

## 2022-08-29 DIAGNOSIS — Z8739 Personal history of other diseases of the musculoskeletal system and connective tissue: Secondary | ICD-10-CM | POA: Diagnosis not present

## 2022-08-29 DIAGNOSIS — S32010A Wedge compression fracture of first lumbar vertebra, initial encounter for closed fracture: Secondary | ICD-10-CM | POA: Diagnosis not present

## 2022-08-29 DIAGNOSIS — R102 Pelvic and perineal pain: Secondary | ICD-10-CM | POA: Diagnosis not present

## 2022-08-29 DIAGNOSIS — M546 Pain in thoracic spine: Secondary | ICD-10-CM | POA: Diagnosis not present

## 2022-08-29 DIAGNOSIS — R9431 Abnormal electrocardiogram [ECG] [EKG]: Secondary | ICD-10-CM | POA: Diagnosis not present

## 2022-08-29 LAB — CBC WITH DIFFERENTIAL/PLATELET
Abs Immature Granulocytes: 0.09 10*3/uL — ABNORMAL HIGH (ref 0.00–0.07)
Basophils Absolute: 0 10*3/uL (ref 0.0–0.1)
Basophils Relative: 0 %
Eosinophils Absolute: 0 10*3/uL (ref 0.0–0.5)
Eosinophils Relative: 0 %
HCT: 43.3 % (ref 36.0–46.0)
Hemoglobin: 14.3 g/dL (ref 12.0–15.0)
Immature Granulocytes: 1 %
Lymphocytes Relative: 29 %
Lymphs Abs: 4.5 10*3/uL — ABNORMAL HIGH (ref 0.7–4.0)
MCH: 30 pg (ref 26.0–34.0)
MCHC: 33 g/dL (ref 30.0–36.0)
MCV: 91 fL (ref 80.0–100.0)
Monocytes Absolute: 0.8 10*3/uL (ref 0.1–1.0)
Monocytes Relative: 5 %
Neutro Abs: 10 10*3/uL — ABNORMAL HIGH (ref 1.7–7.7)
Neutrophils Relative %: 65 %
Platelets: 212 10*3/uL (ref 150–400)
RBC: 4.76 MIL/uL (ref 3.87–5.11)
RDW: 13.6 % (ref 11.5–15.5)
WBC: 15.4 10*3/uL — ABNORMAL HIGH (ref 4.0–10.5)
nRBC: 0 % (ref 0.0–0.2)

## 2022-08-29 LAB — BASIC METABOLIC PANEL
Anion gap: 6 (ref 5–15)
BUN: 19 mg/dL (ref 8–23)
CO2: 27 mmol/L (ref 22–32)
Calcium: 8.9 mg/dL (ref 8.9–10.3)
Chloride: 102 mmol/L (ref 98–111)
Creatinine, Ser: 0.7 mg/dL (ref 0.44–1.00)
GFR, Estimated: >60 mL/min (ref >60)
Glucose, Bld: 125 mg/dL — ABNORMAL HIGH (ref 70–99)
Potassium: 4.7 mmol/L (ref 3.5–5.1)
Sodium: 135 mmol/L (ref 135–145)

## 2022-08-29 MED ORDER — OXYCODONE HCL 5 MG PO TABS: 5.0000 mg | ORAL_TABLET | ORAL | 0 refills | Status: DC | PRN

## 2022-08-29 MED ORDER — DOCUSATE SODIUM 100 MG PO CAPS: 100.0000 mg | ORAL_CAPSULE | Freq: Two times a day (BID) | ORAL | 0 refills | Status: DC

## 2022-08-29 MED ORDER — OXYCODONE HCL 5 MG PO TABS
5.0000 mg | ORAL_TABLET | Freq: Once | ORAL | Status: AC
  Administered 2022-08-29: 5 mg via ORAL
  Filled 2022-08-29: qty 1

## 2022-08-29 MED ORDER — HYDROCODONE-ACETAMINOPHEN 5-325 MG PO TABS
1.0000 | ORAL_TABLET | Freq: Once | ORAL | Status: AC
  Administered 2022-08-29: 1 via ORAL
  Filled 2022-08-29: qty 1

## 2022-08-29 NOTE — ED Provider Notes (Signed)
88Yo female with fall in the shower, back hit wall of shower. Not on thinners, didn't hit head. Lives with husband.  C/o sacral pain, hx arthritis, wears binder. Significant pain with exam trying to roll. Xrs look ok, possible acute/sub acute L1 compression fx. Getting additional Norco for pain. Follow CT scan, ambulate. Physical Exam  BP 119/60   Pulse 78   Temp 97.7 F (36.5 C)   Resp 18   SpO2 98%   Physical Exam  Procedures  Procedures  ED Course / MDM    Medical Decision Making Amount and/or Complexity of Data Reviewed Labs: ordered. Radiology: ordered. ECG/medicine tests: ordered.  Risk OTC drugs. Prescription drug management.   Patient is able to ambulate slowly with use of assistive device and assistance at least 10 feet.  CT shows subacute L1 compression fracture with 10% height loss as identified on x-ray earlier.  Discussed results and plan of care with patient and husband who is at bedside.  Patient is a former physical therapist, has 1 step into her home and states that she has 3 steps from her bed to her bathroom 3 steps into the bathroom from their.  She already has in place of raised commode and shower seat.  Patient would like to be able to go home.  Husband is concerned about his ability to care for her and is in contact with family and reaching out to home health agencies to assist.  Face-to-face completed with request for assistance from [facility] to help with any available and home health services.  Placement from the ER could result in prolonged stay in the ER over the holiday weekend, does not meet admission criteria. Provided with information to follow-up with neurosurgery if needed.  Patient is already wearing a back brace, declines TLSO.  Prescription for narcotic pain medication sent to patient's pharmacy, recommend Colace as well.  Plan is for family to help care for patient at home however advised if unable to, recommend contacting PCP to discuss assistance with  rehab placement and ultimately if unsafe at home, return to ER.  Case discussed with Dr. Jeanell Sparrow, ER attending, agrees with plan of care.       Tacy Learn, PA-C 08/29/22 1442    Ripley Fraise, MD 08/29/22 [phone]

## 2022-08-29 NOTE — ED Triage Notes (Signed)
Pt fell at home around 11p. Pt took some tylenol but did not resolve the problem. Pt fell on buttocks, lost balance in shower. Pt has hx of sacral degenerative disease. Pt complains of sacral pain 10/10. Has chronic pain in that area daily but this is "unbearable"

## 2022-08-29 NOTE — Discharge Instructions (Signed)
Follow up with your primary care provider. Referral to neurosurgery, please call to schedule an appointment. Oxycodone as needed as prescribed for pain.  Can also take Tylenol in addition to this medication. Oxycodone causes constipation, please take with a stool softener, a prescription for Colace has been sent to your pharmacy, add MiraLAX as needed. Return to ER for worsening or concerning symptoms.

## 2022-08-29 NOTE — ED Provider Notes (Signed)
[facility] DEPT Provider Note   CSN: [id number] Arrival date & time: 08/29/22  0419     History  Chief Complaint  Patient presents with   Brenda [name] is a 88 y.o. female.  HPI 88 year old female with a history of CLL, depression, osteopenia presents to the ER with complaints of a fall and sacral/low back pain.  Patient states that she was in the shower when she must of missed stepped (denies any chest pain, dizziness, headache, loss of consciousness) and fell backwards into the shower wall.  She complains of pelvic/sacral pain and low back pain as well as right knee pain.  She has a history of right knee surgery as well as a degenerative sacral bone and wears a back brace almost every day.  She took some Tylenol at home but the pain has been unbearable which prompted evaluation in the ER.  She denies hitting her head or losing consciousness.  She denies any facial droop, weakness, difficulty speaking.    Home Medications Prior to Admission medications   Medication Sig Start Date End Date Taking? Authorizing Provider  atorvastatin (LIPITOR) 20 MG tablet Take 20 mg by mouth daily.    [provider]  cycloSPORINE (RESTASIS) 0.05 % ophthalmic emulsion Place 1 drop into both eyes 2 (two) times daily.    [provider]  desvenlafaxine (PRISTIQ) 50 MG 24 hr tablet Take 50 mg by mouth daily.    [provider]  estradiol (ESTRING) 2 MG vaginal ring Place 2 mg vaginally every 3 (three) months. follow package directions    [provider]  LORazepam (ATIVAN) 0.5 MG tablet Take 0.5 mg by mouth at bedtime.     [provider]  Multiple Vitamin (MULTIVITAMIN) capsule Take 1 capsule by mouth daily.    [provider]  Multiple Vitamins-Minerals (PRESERVISION AREDS 2+MULTI VIT PO) Take 1 capsule by mouth daily.    [provider]  MYRBETRIQ 50 MG TB24 tablet Take 50 mg by mouth daily. 06/02/19    [provider]  pantoprazole (PROTONIX) 40 MG tablet Take 40 mg by mouth daily.    [provider]  solifenacin (VESICARE) 5 MG tablet Take 5 mg by mouth daily. 05/16/19   [provider]  ZETIA 10 MG tablet Take 10 mg by mouth at bedtime. 05/19/13   [provider]  zolpidem (AMBIEN) 5 MG tablet Take 5 mg by mouth at bedtime.    [provider]      Allergies    Azithromycin, Valium [diazepam], Adhesive [tape], Codeine, Doxycycline, and Sulfa antibiotics    Review of Systems   Review of Systems Ten systems reviewed and are negative for acute change, except as noted in the HPI.   Physical Exam Updated Vital Signs BP (!) 159/77   Pulse 95   Temp 98.8 F (37.1 C) (Oral)   Resp 18   SpO2 99%  Physical Exam Vitals and nursing note reviewed.  Constitutional:      General: She is not in acute distress.    Appearance: She is well-developed.  HENT:     Head: Normocephalic and atraumatic.  Eyes:     Conjunctiva/sclera: Conjunctivae normal.  Cardiovascular:     Rate and Rhythm: Normal rate and regular rhythm.     Heart sounds: No murmur heard. Pulmonary:     Effort: Pulmonary effort is normal. No respiratory distress.     Breath sounds: Normal breath sounds.  Abdominal:  Palpations: Abdomen is soft.     Tenderness: There is no abdominal tenderness.  Musculoskeletal:        General: Tenderness present. No swelling.     Cervical back: Neck supple.     Comments: No visible leg shortening, full flexion and extension of right and left hip joints.  5/5 strength in lower extremities and upper extremities.  She does have significant pain with movement, even with rolling in the bed.  No significant midline tenderness to the C, T spine, mild L-spine midline tenderness and generalized tenderness to the sacral/pelvic area though no overlying skin changes from what I can see.  Neurovascularly intact.  Skin:    General: Skin is warm and dry.      Capillary Refill: Capillary refill takes less than 2 seconds.  Neurological:     Mental Status: She is alert.  Psychiatric:        Mood and Affect: Mood normal.     ED Results / Procedures / Treatments   Labs (all labs ordered are listed, but only abnormal results are displayed) Labs Reviewed  CBC  BASIC METABOLIC PANEL  URINALYSIS, ROUTINE W REFLEX MICROSCOPIC    EKG None  Radiology No results found.  Procedures Procedures    Medications Ordered in ED Medications  HYDROcodone-acetaminophen (NORCO/VICODIN) 5-325 MG per tablet 1 tablet (has no administration in time range)    ED Course/ Medical Decision Making/ A&P                           Medical Decision Making Amount and/or Complexity of Data Reviewed Labs: ordered. Radiology: ordered. ECG/medicine tests: ordered.  Risk Prescription drug management.   INITIAL IMPRESSION / ASSESSMENT AND PLAN / ED COURSE   Pertinent labs & imaging results that were available during my care of the patient were reviewed by me and considered in my medical decision making (see chart for details).   This patient is Presenting for Evaluation of fall, sacral/back pain, which does require a range of treatment options, and is a complaint that involves a high risk of morbidity and mortality.   The Differential Diagnoses includes but is not exclusive to fracture, sprain, dislocation, stroke, PE, ACS arrythmia    I decided to review pertinent External Data, and in summary patient was admitted last year for hyponatremia   Clinical Laboratory Tests ordered, reviewed, and interpreted by me    Radiologic Tests Ordered,  I independently interpreted the images and agree with radiology interpretation.    Cardiac Monitor Tracing which shows NS rhythm    Social Determinants of Health Risk: none     Medical Decision Making: Summary:  88 year old female presents to the ER after a fall in the shower.  Patient thinks she may have missed  stepped though it is not entirely clear as to why she ended up falling.  She complains of low back pain and sacral/pelvic pain as well as right knee pain.  No visible deformities on exam.  Range of motion still preserved.  No visible leg shortening.  Neurovascularly intact.  Cranial nerves grossly intact.  She is alert and oriented and appropriate.  She received Norco for pain as her home Tylenol did not improve her pain.  Plan to obtain basic labs, EKG ordered, reviewed, normal sinus rhythm, no evidence of arrhythmia.  X-rays of the sacrum/pelvis/lumbar spine, knee and thoracic spine obtained.  She did have significant pain on exam and if her x-rays do not  correlate, she may end up requiring CT imaging.  XR of lumbar spine with acute or subacute fracture. She continues to have pelvic/sacral pain. Given this, will obtain CT lumbar/pelvis for further evaluation. Labs still pending. Will give additional Norco. After imaging, will attempt to ambulate. If unable, she may require admission.  Care signed out to [facility] who will oversee the rest of her workup and disposition appropriately   This was a shared visit with my supervising physician Dr. Christy Gentles who independently saw and evaluated the patient & provided guidance in evaluation/management/disposition ,in agreement with care   Final Clinical Impression(s) / ED Diagnoses Final diagnoses:  None    Rx / DC Orders ED Discharge Orders     None         Lyndel Safe 08/29/22 2897    Ripley Fraise, MD 08/29/22 [phone]

## 2022-09-09 DIAGNOSIS — M7989 Other specified soft tissue disorders: Secondary | ICD-10-CM | POA: Diagnosis not present

## 2022-09-09 DIAGNOSIS — S32009A Unspecified fracture of unspecified lumbar vertebra, initial encounter for closed fracture: Secondary | ICD-10-CM | POA: Diagnosis not present

## 2022-09-09 DIAGNOSIS — Z6828 Body mass index (BMI) 28.0-28.9, adult: Secondary | ICD-10-CM | POA: Diagnosis not present

## 2022-09-10 ENCOUNTER — Other Ambulatory Visit (HOSPITAL_COMMUNITY): Payer: Self-pay | Admitting: Neurological Surgery

## 2022-09-10 DIAGNOSIS — M7989 Other specified soft tissue disorders: Secondary | ICD-10-CM

## 2022-09-11 ENCOUNTER — Ambulatory Visit (HOSPITAL_COMMUNITY)
Admission: RE | Admit: 2022-09-11 | Discharge: 2022-09-11 | Disposition: A | Discharge status: Home / Self Care | Payer: Medicare Other | Source: Ambulatory Visit | Attending: Cardiology | Admitting: Cardiology

## 2022-09-11 DIAGNOSIS — F329 Major depressive disorder, single episode, unspecified: Secondary | ICD-10-CM | POA: Diagnosis not present

## 2022-09-11 DIAGNOSIS — K219 Gastro-esophageal reflux disease without esophagitis: Secondary | ICD-10-CM | POA: Diagnosis not present

## 2022-09-11 DIAGNOSIS — C9100 Acute lymphoblastic leukemia not having achieved remission: Secondary | ICD-10-CM | POA: Diagnosis not present

## 2022-09-11 DIAGNOSIS — M7989 Other specified soft tissue disorders: Secondary | ICD-10-CM | POA: Insufficient documentation

## 2022-09-11 DIAGNOSIS — K5900 Constipation, unspecified: Secondary | ICD-10-CM | POA: Diagnosis not present

## 2022-09-11 DIAGNOSIS — B370 Candidal stomatitis: Secondary | ICD-10-CM | POA: Diagnosis not present

## 2022-09-11 DIAGNOSIS — E785 Hyperlipidemia, unspecified: Secondary | ICD-10-CM | POA: Diagnosis not present

## 2022-09-11 DIAGNOSIS — K146 Glossodynia: Secondary | ICD-10-CM | POA: Diagnosis not present

## 2022-09-11 DIAGNOSIS — S32010A Wedge compression fracture of first lumbar vertebra, initial encounter for closed fracture: Secondary | ICD-10-CM | POA: Diagnosis not present

## 2022-09-11 DIAGNOSIS — M810 Age-related osteoporosis without current pathological fracture: Secondary | ICD-10-CM | POA: Diagnosis not present

## 2022-09-11 DIAGNOSIS — R600 Localized edema: Secondary | ICD-10-CM | POA: Diagnosis not present

## 2022-09-11 DIAGNOSIS — J449 Chronic obstructive pulmonary disease, unspecified: Secondary | ICD-10-CM | POA: Diagnosis not present

## 2022-09-11 DIAGNOSIS — R413 Other amnesia: Secondary | ICD-10-CM | POA: Diagnosis not present

## 2022-09-15 DIAGNOSIS — N3000 Acute cystitis without hematuria: Secondary | ICD-10-CM | POA: Diagnosis not present

## 2022-10-13 DIAGNOSIS — B370 Candidal stomatitis: Secondary | ICD-10-CM | POA: Diagnosis not present

## 2022-10-13 DIAGNOSIS — Z5181 Encounter for therapeutic drug level monitoring: Secondary | ICD-10-CM | POA: Diagnosis not present

## 2022-10-13 DIAGNOSIS — K146 Glossodynia: Secondary | ICD-10-CM | POA: Diagnosis not present

## 2022-10-16 DIAGNOSIS — R3915 Urgency of urination: Secondary | ICD-10-CM | POA: Diagnosis not present

## 2022-10-20 DIAGNOSIS — R5383 Other fatigue: Secondary | ICD-10-CM | POA: Diagnosis not present

## 2022-10-20 DIAGNOSIS — M810 Age-related osteoporosis without current pathological fracture: Secondary | ICD-10-CM | POA: Diagnosis not present

## 2022-10-30 ENCOUNTER — Other Ambulatory Visit (HOSPITAL_COMMUNITY): Payer: Self-pay | Admitting: *Deleted

## 2022-10-30 DIAGNOSIS — N3000 Acute cystitis without hematuria: Secondary | ICD-10-CM | POA: Diagnosis not present

## 2022-10-30 DIAGNOSIS — R7301 Impaired fasting glucose: Secondary | ICD-10-CM | POA: Diagnosis not present

## 2022-10-30 DIAGNOSIS — B370 Candidal stomatitis: Secondary | ICD-10-CM | POA: Diagnosis not present

## 2022-10-30 DIAGNOSIS — R600 Localized edema: Secondary | ICD-10-CM | POA: Diagnosis not present

## 2022-10-30 DIAGNOSIS — N390 Urinary tract infection, site not specified: Secondary | ICD-10-CM | POA: Diagnosis not present

## 2022-10-30 DIAGNOSIS — F329 Major depressive disorder, single episode, unspecified: Secondary | ICD-10-CM | POA: Diagnosis not present

## 2022-10-30 DIAGNOSIS — C9100 Acute lymphoblastic leukemia not having achieved remission: Secondary | ICD-10-CM | POA: Diagnosis not present

## 2022-10-30 DIAGNOSIS — S32010A Wedge compression fracture of first lumbar vertebra, initial encounter for closed fracture: Secondary | ICD-10-CM | POA: Diagnosis not present

## 2022-10-30 DIAGNOSIS — E785 Hyperlipidemia, unspecified: Secondary | ICD-10-CM | POA: Diagnosis not present

## 2022-10-30 DIAGNOSIS — K146 Glossodynia: Secondary | ICD-10-CM | POA: Diagnosis not present

## 2022-10-30 DIAGNOSIS — R198 Other specified symptoms and signs involving the digestive system and abdomen: Secondary | ICD-10-CM | POA: Diagnosis not present

## 2022-10-30 DIAGNOSIS — M810 Age-related osteoporosis without current pathological fracture: Secondary | ICD-10-CM | POA: Diagnosis not present

## 2022-11-02 ENCOUNTER — Encounter (HOSPITAL_COMMUNITY)
Admission: RE | Admit: 2022-11-02 | Discharge: 2022-11-02 | Disposition: A | Discharge status: Home / Self Care | Payer: Medicare Other | Source: Ambulatory Visit | Attending: Internal Medicine | Admitting: Internal Medicine

## 2022-11-02 DIAGNOSIS — M810 Age-related osteoporosis without current pathological fracture: Secondary | ICD-10-CM | POA: Insufficient documentation

## 2022-11-02 MED ORDER — ZOLEDRONIC ACID 5 MG/100ML IV SOLN
INTRAVENOUS | Status: AC
  Administered 2022-11-02: 5 mg via INTRAVENOUS
  Filled 2022-11-02: qty 100

## 2022-11-02 MED ORDER — ZOLEDRONIC ACID 5 MG/100ML IV SOLN: 5.0000 mg | Freq: Once | INTRAVENOUS | Status: AC

## 2022-11-12 DIAGNOSIS — R350 Frequency of micturition: Secondary | ICD-10-CM | POA: Diagnosis not present

## 2022-11-12 DIAGNOSIS — N3946 Mixed incontinence: Secondary | ICD-10-CM | POA: Diagnosis not present

## 2022-11-12 DIAGNOSIS — N3020 Other chronic cystitis without hematuria: Secondary | ICD-10-CM | POA: Diagnosis not present

## 2022-11-26 DIAGNOSIS — K146 Glossodynia: Secondary | ICD-10-CM | POA: Diagnosis not present

## 2022-11-26 DIAGNOSIS — B370 Candidal stomatitis: Secondary | ICD-10-CM | POA: Diagnosis not present

## 2022-11-26 DIAGNOSIS — K2100 Gastro-esophageal reflux disease with esophagitis, without bleeding: Secondary | ICD-10-CM | POA: Diagnosis not present

## 2022-12-01 ENCOUNTER — Encounter: Payer: Self-pay | Admitting: Internal Medicine

## 2022-12-01 ENCOUNTER — Ambulatory Visit (INDEPENDENT_AMBULATORY_CARE_PROVIDER_SITE_OTHER): Payer: Medicare Other | Admitting: Internal Medicine

## 2022-12-01 VITALS — BP 138/84 | HR 86 | Ht 66.0 in | Wt 174.0 lb

## 2022-12-01 DIAGNOSIS — E042 Nontoxic multinodular goiter: Secondary | ICD-10-CM

## 2022-12-01 LAB — T4, FREE: Free T4: 1.03 ng/dL (ref 0.60–1.60)

## 2022-12-01 LAB — TSH: TSH: 2.35 u[IU]/mL (ref 0.35–5.50)

## 2022-12-01 NOTE — Progress Notes (Signed)
Name: Brenda [name]  MRN/ DOB: [id number], 09-16-1931    Age/ Sex: 89 y.o., female    PCP: Crist Infante, MD   Reason for Endocrinology Evaluation: MNG     Date of Initial Endocrinology Evaluation: 06/10/2022    HPI: Brenda [name] is a 89 y.o. female with a past medical history of MNG and GAD. The patient presented for initial endocrinology clinic visit on 06/10/2022 for consultative assistance with her MNG.   Patient had an incidental finding of thyroid nodules on CT scan which prompted thyroid ultrasound.  Patient had thyroid ultrasound in February 2023 revealing multinodular goiter with 3.2 cm isthmic nodule meeting FNA criteria.   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    SUBJECTIVE:    Today (12/01/22):  Brenda [name] is here for follow-up on multinodular goiter.She is accompanied by her spouse Brenda [name]   Denies local neck swelling  Denies palpitations Denies tremors  Denies diarrhea or loose stools  Continues with burning tongue  Has noted dry skin   She was seen by dermatology for burning tongue syndrome, was started on Clotrimazole  She had a fall in 07/2022 with compression fracture     HISTORY:  Past Medical History:  Past Medical History:  Diagnosis Date   Anxiety    Arthritis    CLL (chronic lymphocytic leukemia)    Complication of anesthesia    Depression    Depression with anxiety    Diverticulosis    Dyspnea    GERD (gastroesophageal reflux disease)    Hearing loss    Memory loss    Mild cognitive impairment    Osteopenia    PONV (postoperative nausea and vomiting)    Reflux    SOB (shortness of breath)    Past Surgical History:  Past Surgical History:  Procedure Laterality Date   ABDOMINAL HYSTERECTOMY     APPENDECTOMY     BREAST EXCISIONAL BIOPSY Left     CARDIAC EVENT MONITOR  02/03/2005   30 days, normal event monitor   CATARACT EXTRACTION Bilateral    EYE SURGERY     squamous cell carcinoma surgery in right eye lid.   KNEE ARTHROSCOPY Right 07/02/2016   Procedure: ARTHROSCOPY KNEE WITH DEBRIDEMENT, PARTIAL LATERAL AND MEDIAL MENISCECTOMY, with drainage of cyst;  Surgeon: Susa Day, MD;  Location: [facility];  Service: Orthopedics;  Laterality: Right;   NM GATED MYOCARDIAL STUDY (ARMX HX)  01/23/2011   Normal pattern of perfusion in all regions. post-stress EF is 85%. EKG negative for ischemia. no ECG changes.   TONSILLECTOMY     TRANSTHORACIC ECHOCARDIOGRAM  10/31/2010   EF >55%, there is concern for inferoacpical infarct with possible inferoapical aneurysm or pseudoaneurysm - could be echo artificat    Social History:  reports that she has never smoked. She has never used smokeless tobacco. She reports that she does not drink alcohol and does not use drugs. Family History: family history includes Alzheimer's disease in her father; Atrial fibrillation in her brother; Dementia in her father, maternal grandmother, and paternal grandmother.   HOME MEDICATIONS: Allergies as of 12/01/2022       Reactions   Hydrocodone Nausea And Vomiting   Azithromycin    Valium [diazepam] Other (See Comments)   Disoriented   Adhesive [tape] Rash  Codeine Nausea And Vomiting   Doxycycline Rash   Severe Reflux , cheek "flushing"   Keflex [cephalexin] Rash   Sulfa Antibiotics Rash        Medication List        Accurate as of December 01, 2022  3:54 PM. If you have any questions, ask your nurse or doctor.          atorvastatin 20 MG tablet Commonly known as: LIPITOR Take 20 mg by mouth daily.   cycloSPORINE 0.05 % ophthalmic emulsion Commonly known as: RESTASIS Place 1 drop into both eyes 2 (two) times daily.   desvenlafaxine 50 MG 24 hr tablet Commonly known as: PRISTIQ Take 50 mg by mouth daily.   docusate sodium 100 MG  capsule Commonly known as: COLACE Take 1 capsule (100 mg total) by mouth every 12 (twelve) hours.   estradiol 2 MG vaginal ring Commonly known as: ESTRING Place 2 mg vaginally every 3 (three) months. follow package directions   LORazepam 0.5 MG tablet Commonly known as: ATIVAN Take 0.5 mg by mouth at bedtime.   multivitamin capsule Take 1 capsule by mouth daily.   Myrbetriq 50 MG Tb24 tablet Generic drug: mirabegron ER Take 50 mg by mouth daily.   nitrofurantoin (macrocrystal-monohydrate) 100 MG capsule Commonly known as: MACROBID Take 100 mg by mouth 2 (two) times daily.   nitrofurantoin 100 MG capsule Commonly known as: MACRODANTIN Take 1 capsule by mouth daily.   oxyCODONE 5 MG immediate release tablet Commonly known as: Roxicodone Take 1 tablet (5 mg total) by mouth every 4 (four) hours as needed for severe pain.   pantoprazole 40 MG tablet Commonly known as: PROTONIX Take 40 mg by mouth daily.   PRESERVISION AREDS 2+MULTI VIT PO Take 1 capsule by mouth daily.   solifenacin 5 MG tablet Commonly known as: VESICARE Take 5 mg by mouth daily.   Zetia 10 MG tablet Generic drug: ezetimibe Take 10 mg by mouth at bedtime.   zolpidem 5 MG tablet Commonly known as: AMBIEN Take 5 mg by mouth at bedtime.          REVIEW OF SYSTEMS: A comprehensive ROS was conducted with the patient and is negative except as per HPI    OBJECTIVE:  VS: BP 138/84   Pulse 86   Ht 5\' 6"  (1.676 m)   Wt 174 lb (78.9 kg)   SpO2 96%   BMI 28.08 kg/m    Wt Readings from Last 3 Encounters:  12/01/22 174 lb (78.9 kg)  06/10/22 180 lb (81.6 kg)  10/01/21 176 lb (79.8 kg)     EXAM: General: Pt appears well and is in NAD  Neck: General: Supple without adenopathy. Thyroid: Isthmic nodule appreciated  Lungs: Clear with good BS bilat with no rales, rhonchi, or wheezes  Heart: Auscultation: RRR.  Abdomen: Normoactive bowel sounds, soft, nontender, without masses or organomegaly  palpable  Extremities:  BL LE: No pretibial edema normal ROM and strength.  Mental Status: Judgment, insight: Intact Orientation: Oriented to time, place, and person Mood and affect: No depression, anxiety, or agitation     DATA REVIEWED:    Latest Reference Range & Units 12/01/22 14:07  TSH 0.35 - 5.50 uIU/mL 2.35  T4,Free(Direct) 0.60 - 1.60 ng/dL 1.03      Thyroid Ultrasound 10/17/2021    Estimated total number of nodules >/= 1 cm: 2   Number of spongiform nodules >/=  2 cm not described below (TR1): 0   Number of mixed  cystic and solid nodules >/= 1.5 cm not described below ([place]): 0   _________________________________________________________   Nodule # 1:   Location: Isthmus; Mid   Maximum size: 3.2 cm; Other 2 dimensions: 1.6 x 1.3 cm   Composition: solid/almost completely solid (2)   Echogenicity: isoechoic (1)   Shape: not taller-than-wide (0)   Margins: smooth (0)   Echogenic foci: none (0)   ACR TI-RADS total points: 3.   ACR TI-RADS risk category: TR3 (3 points).   ACR TI-RADS recommendations:   **Given size (>/= 2.5 cm) and appearance, fine needle aspiration of this mildly suspicious nodule should be considered based on TI-RADS criteria.   _________________________________________________________   Nodule # 2:   Location: Isthmus; Mid   Maximum size: 1.3 cm; Other 2 dimensions: 0.6 x 0.9 cm   Composition: solid/almost completely solid (2)   Echogenicity: hypoechoic (2)   Shape: not taller-than-wide (0)   Margins: smooth (0)   Echogenic foci: none (0)   ACR TI-RADS total points: 4.   ACR TI-RADS risk category: TR4 (4-6 points).   ACR TI-RADS recommendations:   *Given size (>/= 1 - 1.4 cm) and appearance, a follow-up ultrasound in 1 year should be considered based on TI-RADS criteria.   _________________________________________________________   No cervical lymphadenopathy.   IMPRESSION: 1. Moderately heterogeneous,  mildly atrophic thyroid gland. 2. Solid nodule in the isthmus (labeled 1, 3.2 cm) technically meets criteria (TI-RADS category 3) for tissue sampling. 3. Solid nodule in the isthmus (labeled 2, 1.3 cm) meets criteria (TI-RADS category 4) for 1 year ultrasound surveillance.     FNA 3.2 cm isthmic nodule 02/12/2022  Clinical History: Nodule #1: Isthmus; Mid, Maximum size: 3.2 cm; Other 2  dimensions: 1.6 x 1.3 cm, solid/almost completely solid, isoechoic,  TI-RADS total points: 3.  Specimen Submitted:  A. THYROID, MID ISTHMUS, FINE NEEDLE ASPIRATION:    FINAL MICROSCOPIC DIAGNOSIS:  - Consistent with benign follicular nodule ([place] category II)      ASSESSMENT/PLAN/RECOMMENDATIONS:   Multinodular Goiter:  - No local neck symptoms  - Pt is biochemical euthyroid  -TSH normal - she is S/P benign FNA of the isthmic 3.2 cm nodule in 01/2022 -Will repeat thyroid ultrasound  F/U in 1 yr   Signed electronically by: Mack Guise, MD  [facility] Endocrin[facility]  [facility] [address] [place], [place] [address] Phone: [phone] FAX: [phone]   CC: Crist Infante, [place] [address] Phone: [phone] Fax: [phone]   Return to Endocrinology clinic as below: Future Appointments  Date Time Provider [place]  12/01/2023 11:10 AM Elainah Rhyne, Melanie Crazier, MD [facility] None

## 2022-12-14 ENCOUNTER — Ambulatory Visit: Payer: Medicare Other | Admitting: Internal Medicine

## 2022-12-18 DIAGNOSIS — N3946 Mixed incontinence: Secondary | ICD-10-CM | POA: Diagnosis not present

## 2022-12-18 DIAGNOSIS — N3020 Other chronic cystitis without hematuria: Secondary | ICD-10-CM | POA: Diagnosis not present

## 2022-12-22 DIAGNOSIS — H04123 Dry eye syndrome of bilateral lacrimal glands: Secondary | ICD-10-CM | POA: Diagnosis not present

## 2022-12-31 ENCOUNTER — Ambulatory Visit (HOSPITAL_COMMUNITY)
Admission: RE | Admit: 2022-12-31 | Discharge: 2022-12-31 | Disposition: A | Discharge status: Home / Self Care | Payer: Medicare Other | Source: Ambulatory Visit | Attending: Internal Medicine | Admitting: Internal Medicine

## 2022-12-31 DIAGNOSIS — E042 Nontoxic multinodular goiter: Secondary | ICD-10-CM | POA: Diagnosis not present

## 2023-02-17 ENCOUNTER — Other Ambulatory Visit: Payer: Self-pay | Admitting: Internal Medicine

## 2023-02-17 DIAGNOSIS — E042 Nontoxic multinodular goiter: Secondary | ICD-10-CM | POA: Diagnosis not present

## 2023-02-17 DIAGNOSIS — Z8781 Personal history of (healed) traumatic fracture: Secondary | ICD-10-CM | POA: Diagnosis not present

## 2023-02-17 DIAGNOSIS — M810 Age-related osteoporosis without current pathological fracture: Secondary | ICD-10-CM | POA: Diagnosis not present

## 2023-02-17 DIAGNOSIS — Z9181 History of falling: Secondary | ICD-10-CM | POA: Diagnosis not present

## 2023-02-23 ENCOUNTER — Other Ambulatory Visit: Payer: Medicare Other

## 2023-04-15 DIAGNOSIS — M810 Age-related osteoporosis without current pathological fracture: Secondary | ICD-10-CM | POA: Diagnosis not present

## 2023-04-21 DIAGNOSIS — Z1212 Encounter for screening for malignant neoplasm of rectum: Secondary | ICD-10-CM | POA: Diagnosis not present

## 2023-04-21 DIAGNOSIS — E041 Nontoxic single thyroid nodule: Secondary | ICD-10-CM | POA: Diagnosis not present

## 2023-04-21 DIAGNOSIS — H04123 Dry eye syndrome of bilateral lacrimal glands: Secondary | ICD-10-CM | POA: Diagnosis not present

## 2023-04-21 DIAGNOSIS — H11441 Conjunctival cysts, right eye: Secondary | ICD-10-CM | POA: Diagnosis not present

## 2023-04-21 DIAGNOSIS — R7301 Impaired fasting glucose: Secondary | ICD-10-CM | POA: Diagnosis not present

## 2023-04-21 DIAGNOSIS — E785 Hyperlipidemia, unspecified: Secondary | ICD-10-CM | POA: Diagnosis not present

## 2023-04-21 DIAGNOSIS — M810 Age-related osteoporosis without current pathological fracture: Secondary | ICD-10-CM | POA: Diagnosis not present

## 2023-04-28 DIAGNOSIS — J449 Chronic obstructive pulmonary disease, unspecified: Secondary | ICD-10-CM | POA: Diagnosis not present

## 2023-04-28 DIAGNOSIS — F329 Major depressive disorder, single episode, unspecified: Secondary | ICD-10-CM | POA: Diagnosis not present

## 2023-04-28 DIAGNOSIS — R7301 Impaired fasting glucose: Secondary | ICD-10-CM | POA: Diagnosis not present

## 2023-04-28 DIAGNOSIS — Z1331 Encounter for screening for depression: Secondary | ICD-10-CM | POA: Diagnosis not present

## 2023-04-28 DIAGNOSIS — R0600 Dyspnea, unspecified: Secondary | ICD-10-CM | POA: Diagnosis not present

## 2023-04-28 DIAGNOSIS — C9100 Acute lymphoblastic leukemia not having achieved remission: Secondary | ICD-10-CM | POA: Diagnosis not present

## 2023-04-28 DIAGNOSIS — M810 Age-related osteoporosis without current pathological fracture: Secondary | ICD-10-CM | POA: Diagnosis not present

## 2023-04-28 DIAGNOSIS — K146 Glossodynia: Secondary | ICD-10-CM | POA: Diagnosis not present

## 2023-04-28 DIAGNOSIS — Z0000 Encounter for general adult medical examination without abnormal findings: Secondary | ICD-10-CM | POA: Diagnosis not present

## 2023-04-28 DIAGNOSIS — R82998 Other abnormal findings in urine: Secondary | ICD-10-CM | POA: Diagnosis not present

## 2023-04-28 DIAGNOSIS — Z1339 Encounter for screening examination for other mental health and behavioral disorders: Secondary | ICD-10-CM | POA: Diagnosis not present

## 2023-04-28 DIAGNOSIS — Z23 Encounter for immunization: Secondary | ICD-10-CM | POA: Diagnosis not present

## 2023-04-28 DIAGNOSIS — E785 Hyperlipidemia, unspecified: Secondary | ICD-10-CM | POA: Diagnosis not present

## 2023-06-04 DIAGNOSIS — Z23 Encounter for immunization: Secondary | ICD-10-CM | POA: Diagnosis not present

## 2023-06-07 DIAGNOSIS — L570 Actinic keratosis: Secondary | ICD-10-CM | POA: Diagnosis not present

## 2023-06-07 DIAGNOSIS — D1801 Hemangioma of skin and subcutaneous tissue: Secondary | ICD-10-CM | POA: Diagnosis not present

## 2023-06-07 DIAGNOSIS — L821 Other seborrheic keratosis: Secondary | ICD-10-CM | POA: Diagnosis not present

## 2023-06-07 DIAGNOSIS — Z85828 Personal history of other malignant neoplasm of skin: Secondary | ICD-10-CM | POA: Diagnosis not present

## 2023-06-16 DIAGNOSIS — R350 Frequency of micturition: Secondary | ICD-10-CM | POA: Diagnosis not present

## 2023-06-16 DIAGNOSIS — N3020 Other chronic cystitis without hematuria: Secondary | ICD-10-CM | POA: Diagnosis not present

## 2023-06-16 DIAGNOSIS — N3946 Mixed incontinence: Secondary | ICD-10-CM | POA: Diagnosis not present

## 2023-06-29 DIAGNOSIS — H04123 Dry eye syndrome of bilateral lacrimal glands: Secondary | ICD-10-CM | POA: Diagnosis not present

## 2023-07-05 ENCOUNTER — Encounter: Payer: Self-pay | Admitting: Podiatry

## 2023-07-05 ENCOUNTER — Ambulatory Visit (INDEPENDENT_AMBULATORY_CARE_PROVIDER_SITE_OTHER): Payer: Medicare Other

## 2023-07-05 ENCOUNTER — Ambulatory Visit (INDEPENDENT_AMBULATORY_CARE_PROVIDER_SITE_OTHER): Payer: Medicare Other | Admitting: Podiatry

## 2023-07-05 DIAGNOSIS — M778 Other enthesopathies, not elsewhere classified: Secondary | ICD-10-CM

## 2023-07-05 DIAGNOSIS — M2041 Other hammer toe(s) (acquired), right foot: Secondary | ICD-10-CM

## 2023-07-05 NOTE — Progress Notes (Unsigned)
Subjective:   Patient ID: Brenda [name], female   DOB: 89 y.o.   MRN: [id number]   HPI Chief Complaint  Patient presents with  . Toe Pain    Right pinky painful and redness x 77 weeks   89 year old female presents the office today with above concerns.   Now not red and left tender now. Even at bed it was sore. It started 2-3 weeks  She did wear a bandage with padding.    ROS      Objective:  Physical Exam  ***     Assessment:  ***     Plan:  ***

## 2023-07-05 NOTE — Patient Instructions (Signed)
The pad is from Dr. Edwena Felty and is called a digital corn pad- gel and fabric

## 2023-07-08 DIAGNOSIS — H5203 Hypermetropia, bilateral: Secondary | ICD-10-CM | POA: Diagnosis not present

## 2023-07-08 DIAGNOSIS — H04123 Dry eye syndrome of bilateral lacrimal glands: Secondary | ICD-10-CM | POA: Diagnosis not present

## 2023-07-08 DIAGNOSIS — H353132 Nonexudative age-related macular degeneration, bilateral, intermediate dry stage: Secondary | ICD-10-CM | POA: Diagnosis not present

## 2023-07-22 DIAGNOSIS — H16223 Keratoconjunctivitis sicca, not specified as Sjogren's, bilateral: Secondary | ICD-10-CM | POA: Diagnosis not present

## 2023-08-04 DIAGNOSIS — Z85828 Personal history of other malignant neoplasm of skin: Secondary | ICD-10-CM | POA: Diagnosis not present

## 2023-08-04 DIAGNOSIS — H61001 Unspecified perichondritis of right external ear: Secondary | ICD-10-CM | POA: Diagnosis not present

## 2023-08-05 DIAGNOSIS — H16223 Keratoconjunctivitis sicca, not specified as Sjogren's, bilateral: Secondary | ICD-10-CM | POA: Diagnosis not present

## 2023-08-05 DIAGNOSIS — H02834 Dermatochalasis of left upper eyelid: Secondary | ICD-10-CM | POA: Diagnosis not present

## 2023-08-05 DIAGNOSIS — H02831 Dermatochalasis of right upper eyelid: Secondary | ICD-10-CM | POA: Diagnosis not present

## 2023-08-05 DIAGNOSIS — H353121 Nonexudative age-related macular degeneration, left eye, early dry stage: Secondary | ICD-10-CM | POA: Diagnosis not present

## 2023-08-19 DIAGNOSIS — N3946 Mixed incontinence: Secondary | ICD-10-CM | POA: Diagnosis not present

## 2023-08-19 DIAGNOSIS — R350 Frequency of micturition: Secondary | ICD-10-CM | POA: Diagnosis not present

## 2023-08-21 IMAGING — CT CT CHEST-ABD-PELV W/ CM
3 of 5 series · 14 of 36 positions shown, 16 images · IV contrast (OMNIPAQUE)
Comparison: Multiple exams, including 10/19/2020

CLINICAL DATA: Suspected chronic lymphocytic leukemia with
monoclonal B-cell lymphocytosis.

EXAM:
CT CHEST, ABDOMEN, AND PELVIS WITH CONTRAST
TECHNIQUE: Multidetector CT imaging of the chest, abdomen and pelvis was
performed following the standard protocol during bolus
administration of intravenous contrast.

[Series 2: cap with · axial · 0.95mm/px · z∈[-636,-131]mm · 9 of 127 slices shown, 11 images]
[im 13/127  mediastinal]
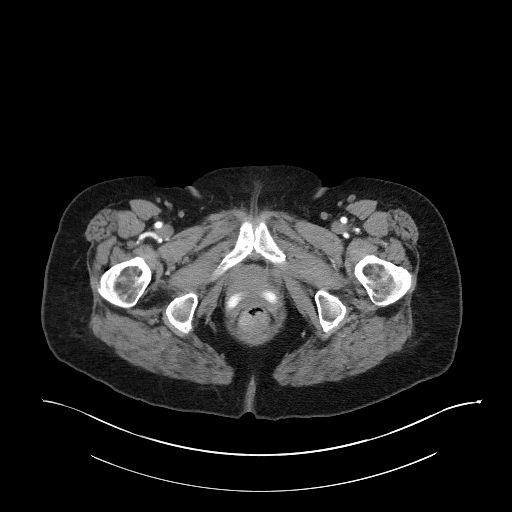
[im 13/127  bone]
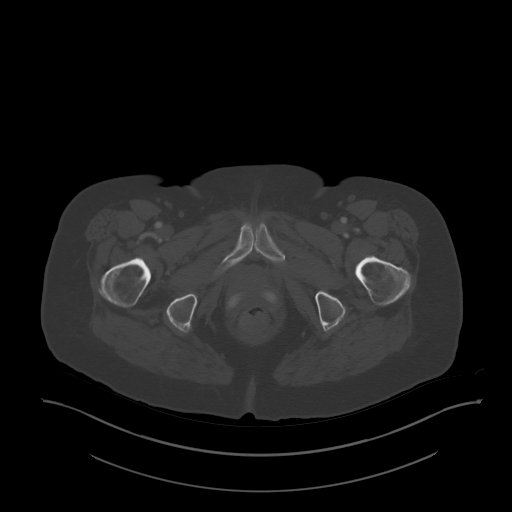
[im 26/127  mediastinal]
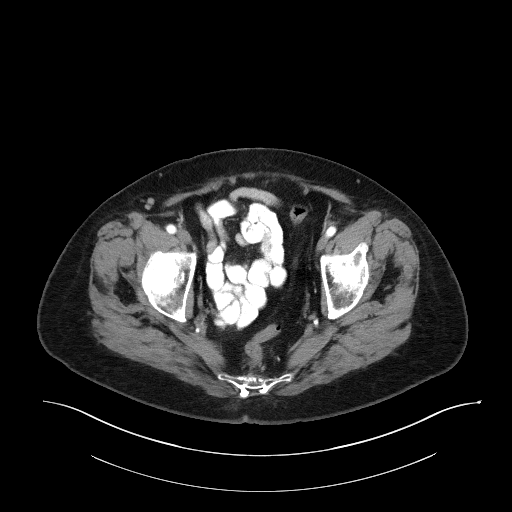
[im 38/127  mediastinal]
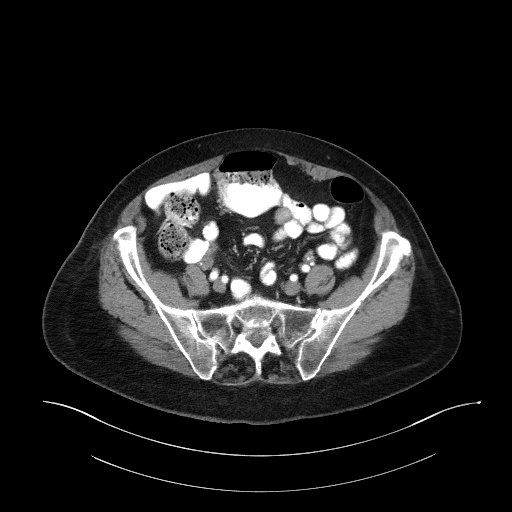
[im 51/127  mediastinal]
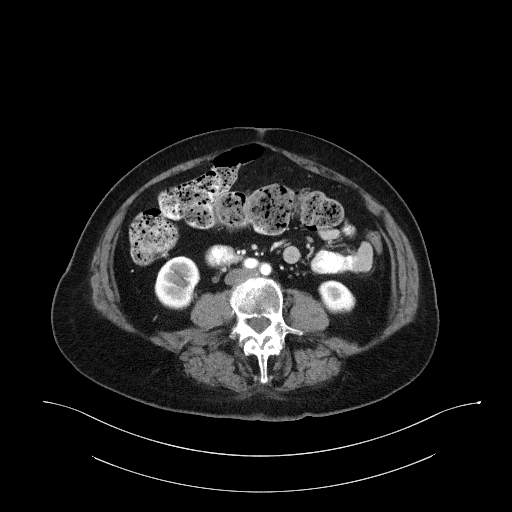
[im 64/127  mediastinal]
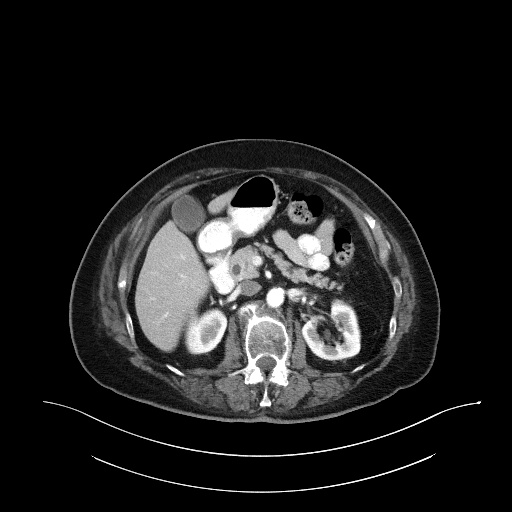
[im 76/127  mediastinal]
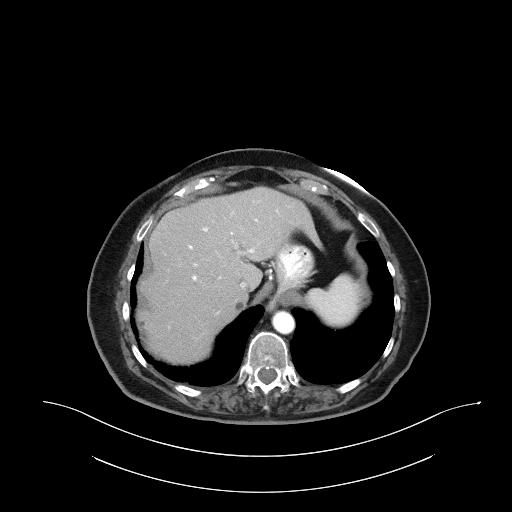
[im 89/127  mediastinal]
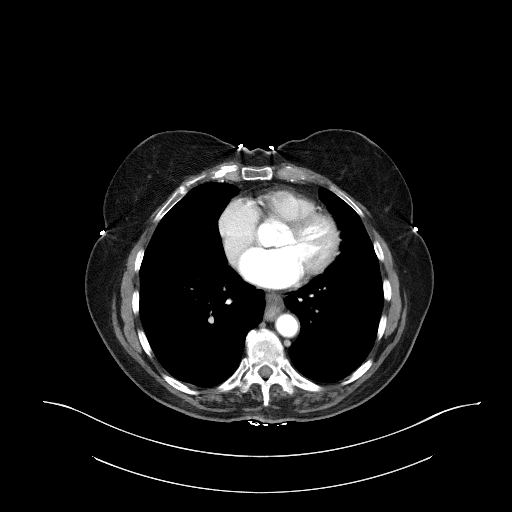
[im 101/127  mediastinal]
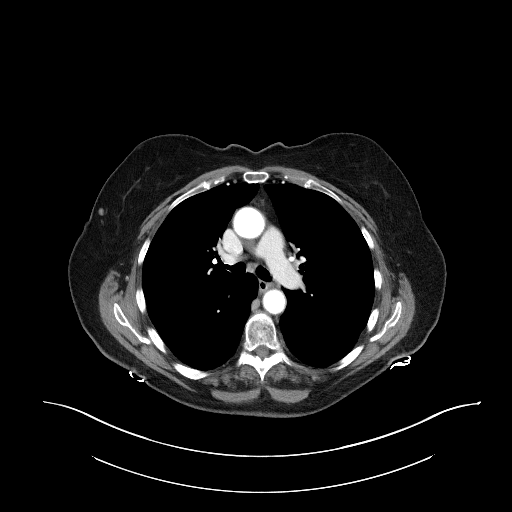
[im 114/127  mediastinal]
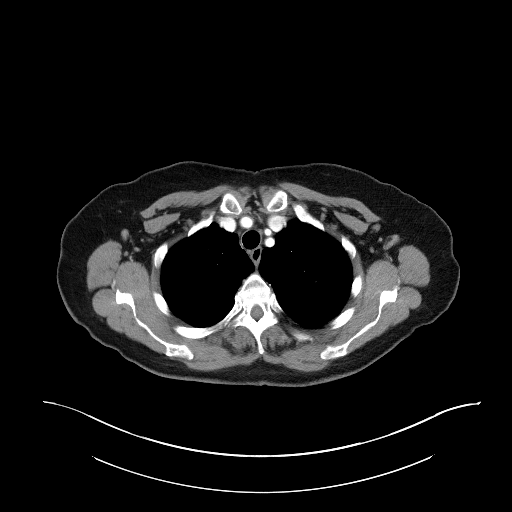
[im 114/127  bone]
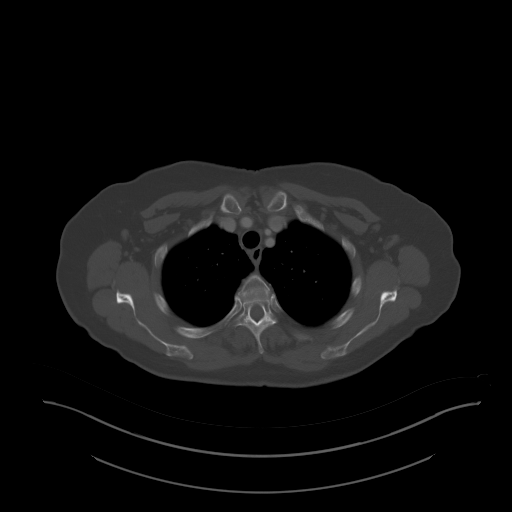

[Series 4: lung · axial · 0.76mm/px · z∈[-362,-314]mm · 2 of 161 slices shown]
[im 13/161  bone]
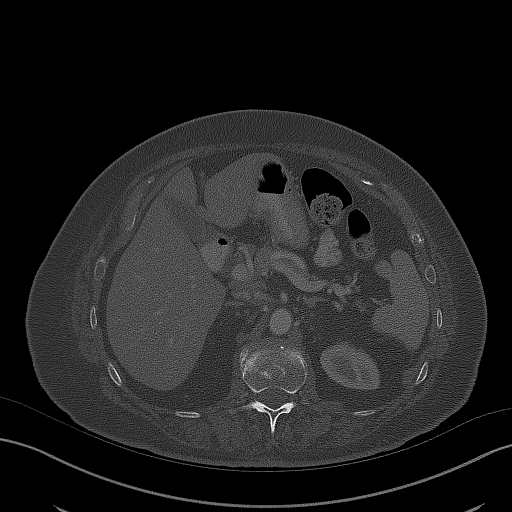
[im 37/161  bone]
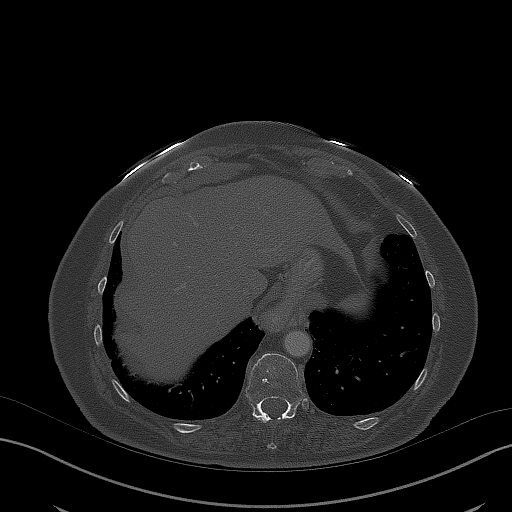

[Series 5: coronals · coronal · 0.82mm/px · 3 of 185 slices shown]
[im 37/185  mediastinal]
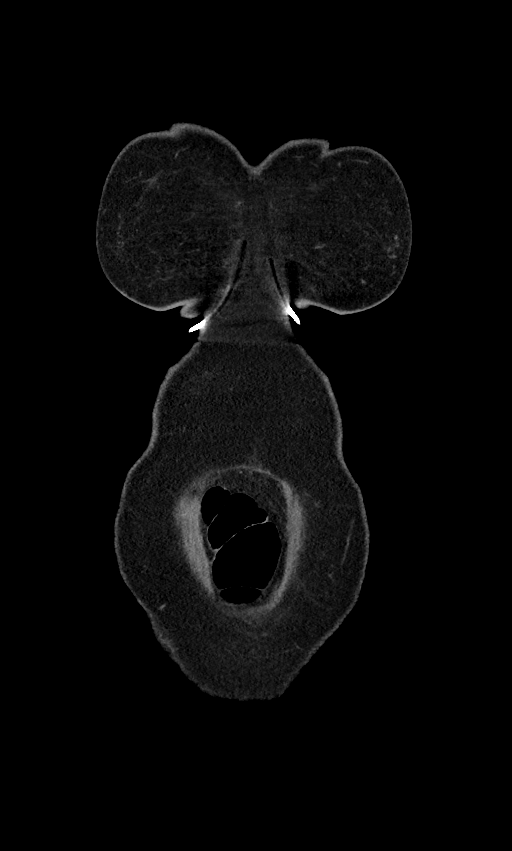
[im 74/185  mediastinal]
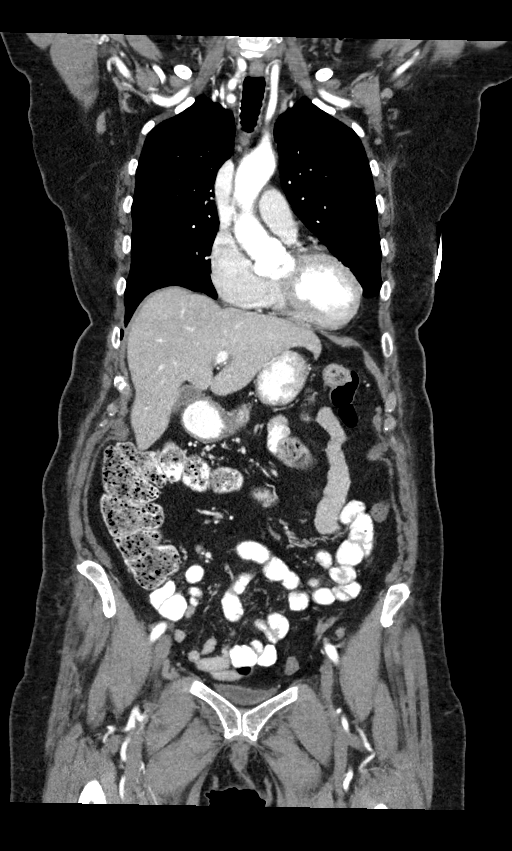
[im 111/185  mediastinal]
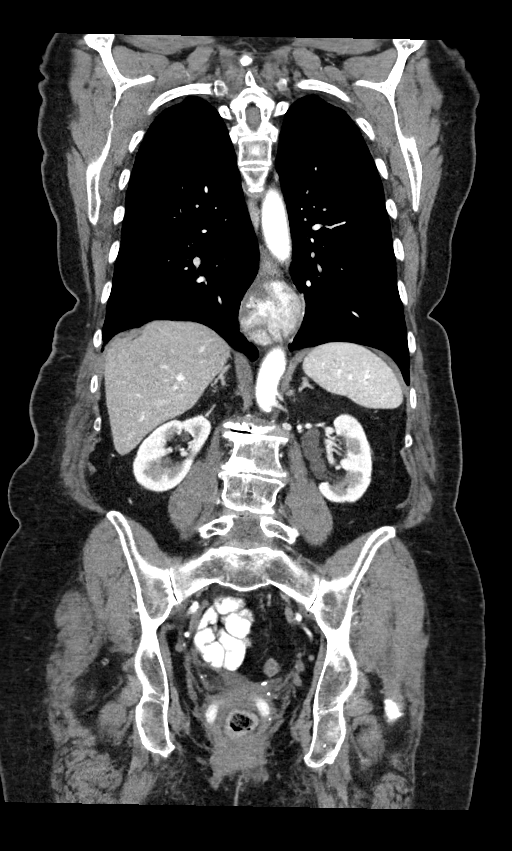

[14 of 36 positions shown; findings below may reference images not displayed]

RADIATION DOSE REDUCTION: This exam was performed according to the
departmental dose-optimization program which includes automated
exposure control, adjustment of the mA and/or kV according to
patient size and/or use of iterative reconstruction technique.

CONTRAST:  100mL OMNIPAQUE IOHEXOL 300 MG/ML  SOLN
FINDINGS: CT CHEST FINDINGS

Cardiovascular: Atherosclerotic calcification of the thoracic aorta
and branch vessels.

Mediastinum/Nodes: 2.5 by 2.3 by 1.8 cm solid heterogeneously
enhancing nodule of the thyroid isthmus.

Small calcified subcarinal and left infrahilar lymph nodes
compatible with old granulomatous disease.

Moderate-sized type 3 hiatal hernia.

Lungs/Pleura: Mild anteromedial scarring versus subsegmental
atelectasis in the right upper lobe, right middle lobe, and right
lower lobe.

Musculoskeletal: Unremarkable

CT ABDOMEN PELVIS FINDINGS

Hepatobiliary: 0.8 by 0.6 cm hypodense lesion medially in the right
hepatic lobe on image 52 of series 2, stable. 0.3 cm hypodense
lesion peripherally in the right hepatic lobe also on image 52 of
series 2, stable. These are likely small cysts although technically
nonspecific due to small size, but both are stable from 6386. Common
bile duct 0.8 cm in diameter, within normal limits for age.

Pancreas: Unremarkable

Spleen: Punctate calcifications in the spleen compatible with old
granulomatous disease. The spleen measures 7.8 by 5.8 by 5.6 cm
(volume = 130 cm^3), within normal limits. No worrisome splenic
lesion.

Adrenals/Urinary Tract: Bilateral peripelvic cysts. No
hydronephrosis or hydroureter. No urinary tract calculi. Adrenal
glands unremarkable.

Stomach/Bowel: Hiatal hernia is noted above. Mildly mobile cecum
near the midline.

Vascular/Lymphatic: Atherosclerosis is present, including aortoiliac
atherosclerotic disease. No pathologic adenopathy is identified.

Reproductive: Uterus absent.  Vaginal ring device noted.

Other: No supplemental non-categorized findings.

Musculoskeletal: Grade 1 degenerative retrolisthesis at L2-3 and
L3-4 with grade 1 anterolisthesis at L5-S1. In conjunction with
lumbar spondylosis and degenerative disc disease there is multilevel
lumbar impingement.

Small umbilical hernia contains adipose tissue.
IMPRESSION: 1. No adenopathy, splenic lesions, or specific indicator of active
malignancy.
2. 2.5 cm heterogeneously enhancing nodule of the thyroid isthmus.
Recommend thyroid US if clinically warranted given patient age.
Reference: [HOSPITAL]. [DATE]): 143-50
3. Other imaging findings of potential clinical significance: Aortic
Atherosclerosis ([id number].M). Old granulomatous disease.
Moderate-sized type 3 hiatal hernia. Multilevel lumbar impingement.

## 2023-08-23 DIAGNOSIS — R350 Frequency of micturition: Secondary | ICD-10-CM | POA: Diagnosis not present

## 2023-09-02 DIAGNOSIS — N3946 Mixed incontinence: Secondary | ICD-10-CM | POA: Diagnosis not present

## 2023-09-02 DIAGNOSIS — N3000 Acute cystitis without hematuria: Secondary | ICD-10-CM | POA: Diagnosis not present

## 2023-09-16 DIAGNOSIS — R300 Dysuria: Secondary | ICD-10-CM | POA: Diagnosis not present

## 2023-09-16 DIAGNOSIS — N3020 Other chronic cystitis without hematuria: Secondary | ICD-10-CM | POA: Diagnosis not present

## 2023-09-17 DIAGNOSIS — R051 Acute cough: Secondary | ICD-10-CM | POA: Diagnosis not present

## 2023-09-17 DIAGNOSIS — J111 Influenza due to unidentified influenza virus with other respiratory manifestations: Secondary | ICD-10-CM | POA: Diagnosis not present

## 2023-09-17 DIAGNOSIS — R0981 Nasal congestion: Secondary | ICD-10-CM | POA: Diagnosis not present

## 2023-09-17 DIAGNOSIS — R5383 Other fatigue: Secondary | ICD-10-CM | POA: Diagnosis not present

## 2023-09-17 DIAGNOSIS — K146 Glossodynia: Secondary | ICD-10-CM | POA: Diagnosis not present

## 2023-09-17 DIAGNOSIS — C9100 Acute lymphoblastic leukemia not having achieved remission: Secondary | ICD-10-CM | POA: Diagnosis not present

## 2023-09-17 DIAGNOSIS — Z1152 Encounter for screening for COVID-19: Secondary | ICD-10-CM | POA: Diagnosis not present

## 2023-09-17 DIAGNOSIS — B370 Candidal stomatitis: Secondary | ICD-10-CM | POA: Diagnosis not present

## 2023-09-17 DIAGNOSIS — J449 Chronic obstructive pulmonary disease, unspecified: Secondary | ICD-10-CM | POA: Diagnosis not present

## 2023-09-20 DIAGNOSIS — H04123 Dry eye syndrome of bilateral lacrimal glands: Secondary | ICD-10-CM | POA: Diagnosis not present

## 2023-09-28 DIAGNOSIS — R399 Unspecified symptoms and signs involving the genitourinary system: Secondary | ICD-10-CM | POA: Diagnosis not present

## 2023-10-08 DIAGNOSIS — H04122 Dry eye syndrome of left lacrimal gland: Secondary | ICD-10-CM | POA: Diagnosis not present

## 2023-10-08 DIAGNOSIS — H04121 Dry eye syndrome of right lacrimal gland: Secondary | ICD-10-CM | POA: Diagnosis not present

## 2023-10-12 DIAGNOSIS — H04123 Dry eye syndrome of bilateral lacrimal glands: Secondary | ICD-10-CM | POA: Diagnosis not present

## 2023-11-04 ENCOUNTER — Ambulatory Visit: Payer: Medicare Other | Admitting: Obstetrics

## 2023-11-04 NOTE — Progress Notes (Signed)
 New Patient Evaluation and Consultation  Referring Provider: Rodrigo Ran, MD PCP: Rodrigo Ran, MD Date of Service: 11/05/2023  SUBJECTIVE Chief Complaint: New Patient (Initial Visit) Brenda [name] is a 89 y.o. female here today for urinary incontinence )  History of Present Illness: Brenda [name] is a 89 y.o. White or Caucasian female seen in consultation at the request of Dr Waynard Edwards for evaluation of mixed urinary incontinence and frequent UTI.    Brenda [name], difficulty with memory UTI symptoms: hurting after urination, urethral pain in certain position with sitting in her kitchen chair Denies pyelonephritis Reports history of fall with L1 compression fracture in 2024 and using a brace Managed by Dr. Sherron Monday for the past few years. Denies procedure or surgical intervention. History of cystocele, urethral caruncle, possible pessary in place. Declined PTNS in the past.  Uses vaginal estrodiol 1g twice a week and vaginal estrogen with estring changes every 3 months Urethral pain and rash attributed to leakage, denies pain now. Azo with relief S/p UDS and cystoscopy Urine cultures: - 09/28/23 >100K E. Coli resistant to ampicillin, intermediate to levaquin. UA +leuk/protein/heme,  > 30 RBCs - 09/18/23 >100K E. Coli resistant to ampicillin, augmentin, cefazolin, cefepime, tetracycline.  Previously on macrodantin for suppression, last treated with Cipro on 09/16/23. Prior urine culture with yeast s/p treatment. Tried oxybutynin, solifenacin, mirabegron 50mg  in the past, Gemtesa with relief by Dr. Sherron Monday however unclear timing of discontinuation. Azo with relief Using probiotics  Review of records significant for: CLL, PONV  Urinary Symptoms: Leaks urine with cough/ sneeze, with movement to the bathroom, with urgency, while asleep, and continuously Reports feeling wet all the time with insensible leakage. Reports contracting pelvic floor when she has a cough, when she does not, leaks  3x/day Denies leakage with urgency Denies change in perineal sensation Leaks >5 time(s) per days without sensation Pad use: 5 pads (uses 2 pads at once) per day.   Patient is bothered by UI symptoms.  Day time voids 4-5.  Nocturia: 1 times per night to void. Voiding dysfunction:  empties bladder well.  Patient does not use a catheter to empty bladder.  When urinating, patient feels the need to urinate multiple times in a row, voids 2x every time she sits on the commode by waiting Drinks: 32oz water per day, 1 cup of regular and 1 cup decaf coffee, 24oz milk  UTIs: 7 UTI's in the last year.   Denies history of blood in urine, kidney or bladder stones, pyelonephritis, bladder cancer, and kidney cancer No results found for the last 90 days.   Pelvic Organ Prolapse Symptoms:                  Patient Denies a feeling of a bulge the vaginal area.  Bowel Symptom: Bowel movements: 3-4 time(s) per day for the past year, history of diverticulosis Stool consistency: soft  Straining: yes.  Splinting: no Incomplete evacuation: no  Patient Denies accidental bowel leakage / fecal incontinence Bowel regimen:  remote history of imodium for frequent bowel movements Last colonoscopy: Date 2012, Results internal and external hemorrhoids [facility]   This patient has no relevant Health Maintenance data.     Sexual Function Sexually active: no.  Sexual orientation: Straight Pain with sex: has discomfort due to dryness without pain in the past  Pelvic Pain Denies pelvic pain  Past Medical History:  Past Medical History:  Diagnosis Date   Anxiety    Arthritis    CLL (chronic lymphocytic leukemia) (  HCC)    Complication of anesthesia    Depression    Depression with anxiety    Diverticulosis    Dyspnea    GERD (gastroesophageal reflux disease)    Hearing loss    Memory loss    Mild cognitive impairment    Osteopenia    PONV (postoperative nausea and vomiting)    Reflux    SOB  (shortness of breath)      Past Surgical History:   Past Surgical History:  Procedure Laterality Date   ABDOMINAL HYSTERECTOMY     APPENDECTOMY     BREAST EXCISIONAL BIOPSY Left    CARDIAC EVENT MONITOR  02/03/2005   30 days, normal event monitor   CATARACT EXTRACTION Bilateral    EYE SURGERY     squamous cell carcinoma surgery in right eye lid.   KNEE ARTHROSCOPY Right 07/02/2016   Procedure: ARTHROSCOPY KNEE WITH DEBRIDEMENT, PARTIAL LATERAL AND MEDIAL MENISCECTOMY, with drainage of cyst;  Surgeon: Jene Every, MD;  Location: [facility];  Service: Orthopedics;  Laterality: Right;   NM GATED MYOCARDIAL STUDY (ARMX HX)  01/23/2011   Normal pattern of perfusion in all regions. post-stress EF is 85%. EKG negative for ischemia. no ECG changes.   TONSILLECTOMY     TRANSTHORACIC ECHOCARDIOGRAM  10/31/2010   EF >55%, there is concern for inferoacpical infarct with possible inferoapical aneurysm or pseudoaneurysm - could be echo artificat     Past OB/GYN History: OB History  Gravida Para Term Preterm AB Living  2 2 2   2   SAB IAB Ectopic Multiple Live Births      2    # Outcome Date GA Lbr Len/2nd Weight Sex Type Anes PTL Lv  2 Term     F Vag-Spont   LIV  1 Term     F Vag-Spont   LIV    Vaginal deliveries: 2, largest infant 9lbs Forceps/ Vacuum deliveries: 0, Cesarean section: 0 Menopausal: Yes, at age 5, Denies vaginal bleeding since menopause Contraception: s/p menopause and hysterectomy. Last pap smear was 2023, no results for review.  Any history of abnormal pap smears: no. No results found for: "DIAGPAP", "HPVHIGH", "ADEQPAP"  Medications: Patient has a current medication list which includes the following prescription(s): anoro ellipta, atorvastatin, cyclosporine, desvenlafaxine, estradiol, lidocaine, lorazepam, multivitamin, multiple vitamins-minerals, gemtesa, gemtesa, zetia, and zolpidem.   Allergies: Patient is allergic to hydrocodone, azithromycin, valium [diazepam],  adhesive [tape], codeine, doxycycline, keflex [cephalexin], and sulfa antibiotics.   Social History:  Social History   Tobacco Use   Smoking status: Never   Smokeless tobacco: Never  Vaping Use   Vaping status: Never Used  Substance Use Topics   Alcohol use: No   Drug use: No    Relationship status: married Patient lives with her husband.   Patient is not employed. Regular exercise: No History of abuse: No  Family History:   Family History  Problem Relation Age of Onset   Alzheimer's disease Father    Dementia Father    Colon cancer Father    Atrial fibrillation Brother    Dementia Maternal Grandmother    Dementia Paternal Grandmother    Bladder Cancer Neg Hx    Uterine cancer Neg Hx      Review of Systems: Review of Systems  Constitutional:  Positive for malaise/fatigue and weight loss. Negative for fever.  Respiratory:  Positive for shortness of breath. Negative for cough and wheezing.   Cardiovascular:  Positive for leg swelling. Negative for chest pain and  palpitations.  Gastrointestinal:  Negative for abdominal pain, blood in stool and constipation.  Genitourinary:  Positive for dysuria and urgency. Negative for frequency and hematuria.       Leakage  Skin:  Negative for rash.  Neurological:  Negative for dizziness, weakness and headaches.  Endo/Heme/Allergies:  Bruises/bleeds easily.       Hot flashes  Psychiatric/Behavioral:  Positive for depression. The patient is not nervous/anxious.      OBJECTIVE Physical Exam: Vitals:   11/05/23 0804 11/05/23 0915  BP: (!) 151/87 (!) 164/84  Pulse: 72   Weight: 168 lb 3.2 oz (76.3 kg)   Height: 4\' 5"  (1.346 m)     Physical Exam Constitutional:      General: She is not in acute distress.    Appearance: Normal appearance.  Genitourinary:     Bladder and urethral meatus normal.     No lesions in the vagina.     Genitourinary Comments: 3-62mm polypoid lesion from posterior urethral meatus without discharge,  bleeding or pain     Right Labia: No rash, tenderness, lesions, skin changes or Bartholin's cyst.    Left Labia: No tenderness, lesions, skin changes, Bartholin's cyst or rash.       No vaginal discharge, erythema, tenderness, bleeding, ulceration or granulation tissue.     Moderate vaginal atrophy present.    Vaginal exam comments: Estring in place.      Right Adnexa: not tender, not full and no mass present.    Left Adnexa: not tender, not full and no mass present.    Cervix is absent.     Uterus is absent.     Urethral meatus caruncle not present.    No urethral prolapse, tenderness, mass, hypermobility, discharge or stress urinary incontinence with cough stress test present.     Bladder is not tender, urgency on palpation not present and masses not present.      Levator ani not tender, obturator internus not tender, no asymmetrical contractions present and no pelvic spasms present.    Anal wink present and BC reflex present. Cardiovascular:     Rate and Rhythm: Normal rate.  Pulmonary:     Effort: Pulmonary effort is normal. No respiratory distress.  Abdominal:     General: There is no distension.     Palpations: Abdomen is soft. There is no mass.     Tenderness: There is no abdominal tenderness.     Hernia: No hernia is present.    Neurological:     Mental Status: She is alert.  Vitals reviewed. Exam conducted with a chaperone present.      POP-Q:   POP-Q  -3                                            Aa   -3                                           Ba  -7                                              C   1  Gh  4                                            Pb  7                                            tvl   -3                                            Ap  -3                                            Bp                                                 D    Post-Void Residual (PVR) by Bladder Scan: In  order to evaluate bladder emptying, we discussed obtaining a postvoid residual and patient agreed to this procedure.  Procedure: The ultrasound unit was placed on the patient's abdomen in the suprapubic region after the patient had voided.    Post Void Residual - 11/05/23 0815       Post Void Residual   Post Void Residual 41 mL            Straight Catheterization Procedure for PVR: After verbal consent was obtained from the patient for catheterization to assess bladder emptying and residual volume the urethra and surrounding tissues were prepped with betadine and an in and out catheterization was performed.  PVR was 40mL.  Urine appeared clear yellow. The patient tolerated the procedure well.   Laboratory Results: Lab Results  Component Value Date   COLORU yellow 11/05/2023   CLARITYU clear 11/05/2023   GLUCOSEUR Negative 11/05/2023   BILIRUBINUR negative 11/05/2023   KETONESU trace 11/05/2023   SPECGRAV 1.020 11/05/2023   RBCUR negative 11/05/2023   PHUR 5.5 11/05/2023   PROTEINUR Negative 11/05/2023   UROBILINOGEN 0.2 11/05/2023   LEUKOCYTESUR Negative 11/05/2023    Lab Results  Component Value Date   CREATININE 0.70 08/29/2022   CREATININE 0.72 09/18/2021   CREATININE 0.79 03/26/2021    No results found for: "HGBA1C"  Lab Results  Component Value Date   HGB 14.3 08/29/2022     ASSESSMENT AND PLAN Brenda [name] is a 89 y.o. with:  1. Memory loss   2. Urinary incontinence, mixed   3. History of recurrent UTI (urinary tract infection)   4. Frequent bowel movements   5. Urethral caruncle     Memory loss Assessment & Plan: - will not use anti-muscarinics due to age and Brenda [name] - patient to review medication bottles and discontinue oxybutynin and solifenacin.    Urinary incontinence, mixed Assessment & Plan: - unable to void, catheterized for 40mL - h/o fall with L1 compression fracture - Tried oxybutynin, solifenacin, mirabegron 50mg  in the past, Gemtesa  with relief by Dr. Sherron Monday however unclear timing of  discontinuation. - anti-muscarinics contraindicated due to age and Brenda [name], mirabegron due to HTN - mostly insensible, ROI signed to review most recent cystoscopy, urodynamics and renal imaging.  We discussed the symptoms of overactive bladder (OAB), which include urinary urgency, urinary frequency, nocturia, with or without urge incontinence.  While we do not know the exact etiology of OAB, several treatment options exist. We discussed management including behavioral therapy (decreasing bladder irritants, urge suppression strategies, timed voids, bladder retraining), physical therapy, medication; for refractory cases posterior tibial nerve stimulation, sacral neuromodulation, and intravesical botulinum toxin injection.  For anticholinergic medications, we discussed the potential side effects of anticholinergics including dry eyes, dry mouth, constipation, cognitive impairment and urinary retention. For Beta-3 agonist medication, we discussed the potential side effect of elevated blood pressure which is more likely to occur in individuals with uncontrolled hypertension. - samples and Rx provided for gemtesa to resume - For treatment of stress urinary incontinence,  non-surgical options include expectant management, weight loss, physical therapy, as well as a pessary.  Surgical options include a midurethral sling, Burch urethropexy, and transurethral injection of a bulking agent.  Orders: -     POCT urinalysis dipstick -     Gemtesa; Take 1 tablet (75 mg total) by mouth daily. Brenda [name]; Take 1 tablet (75 mg total) by mouth daily.  Dispense: 30 tablet; Refill: 2  History of recurrent UTI (urinary tract infection) Assessment & Plan: - last urine culture 09/28/23 >100K E. Coli resistant to ampicillin, intermediate to levaquin. UA +leuk/protein/heme,  > 30 RBCs - catheterized urine + ketones only, pending pathnostics due to intermittent urethral  pain and history of rUTI.  - prior macrodantin daily for suppression - denies stones, pyelonephritis  - For treatment of recurrent urinary tract infections, we discussed management of recurrent UTIs including prophylaxis with a daily low dose antibiotic, transvaginal estrogen therapy, D-mannose, and cranberry supplements.  We discussed the role of diagnostic testing such as cystoscopy and upper tract imaging.   - will review results from [facility] - discussed possible repeat cystoscopy and renal imaging if no recent testing available for review - continue estring, discussed reduction of topical vaginal estrogen to 0.5g twice a week over urethral meatus and vulva.  - reviewed anatomy and discussed urethral caruncle with slight increase size during valsalva - encouraged bowel regiment to reduce straining - discussed NSAIDs, tylenol for UTI symptoms. Pt to present to office for testing if she experiences symptoms. Can use pyridium PRN symptoms - pt to call call if you continue to experience persistent or worsening urinary symptoms such as fever > 100.4, nausea/vomiting, one sided back pain or blood in urine that requires urgent evaluation - discontinue if gemtesa increases frequency of UTI, unclear if patient is currently taking oxybutynin or solifenacin. Patient to review at home and bring med bottles for review at follow-up.   Orders: -     POCT urinalysis dipstick -     [facility] Molecular Test; Future  Frequent bowel movements Assessment & Plan: - reports straining at the end of bowel movements - trial of bowel regimen to optimize stool consistency.  We also discussed the importance of avoiding chronic straining, as it can exacerbate her pelvic floor symptoms. We discussed initiating therapy with fiber supplementation    Urethral caruncle Assessment & Plan: - reviewed anatomy - 3-74mm polypoid lesion from posterior meatus - recheck PVR at follow-up. - For symptomatic vaginal  atrophy options include lubrication with a water-based lubricant, personal hygiene  measures and barrier protection against wetness, and estrogen replacement in the form of vaginal cream, vaginal tablets, or a time-released vaginal ring.   - continue estring use, reduce topical estrogen to urethral meatus use   Other orders -     Lidocaine; Apply 0.5g (peasize) over urethra if you experience pain  Dispense: 35.44 g; Refill: 0  Time spent: I spent 74 minutes dedicated to the care of this patient on the date of this encounter to include pre-visit review of records, face-to-face time with the patient discussing recurrent UTI, insensible urinary incontinence, memory loss, frequency bowel movements, urethral caruncle, and post visit documentation and ordering medication/ testing.    Loleta Chance, MD

## 2023-11-04 NOTE — Progress Notes (Deleted)
 New Patient Evaluation and Consultation  Referring Provider: Rodrigo Ran, MD PCP: Rodrigo Ran, MD Date of Service: 11/04/2023  SUBJECTIVE Chief Complaint: No chief complaint on file.  History of Present Illness: Brenda [name] is a 89 y.o. White or Caucasian female seen in consultation at the request of Dr Waynard Edwards for evaluation of ***.    ***Review of records significant for: ***MCI, PONV, CLL  Urinary Symptoms: {urine leakage?:24754} Leaks *** time(s) per {days/wks/mos/yrs:310907}.  Pad use: {NUMBERS 1-10:18281} {pad option:24752} per day.   Patient {ACTION; IS/IS GLO:75643329} bothered by UI symptoms.  Day time voids ***.  Nocturia: *** times per night to void. Voiding dysfunction:  {empties:24755} bladder well.  Patient {DOES NOT does:27190::"does not"} use a catheter to empty bladder.  When urinating, patient feels {urine symptoms:24756} Drinks: *** per day  UTIs: {NUMBERS 1-10:18281} UTI's in the last year.   {ACTIONS;DENIES/REPORTS:21021675::"Denies"} history of {urologic concerns:24757} No results found for the last 90 days.   Pelvic Organ Prolapse Symptoms:                  Patient {denies/ admits to:24761} a feeling of a bulge the vaginal area. It has been present for {NUMBER 1-10:22536} {days/wks/mos/yrs:310907}.  Patient {denies/ admits to:24761} seeing a bulge.  This bulge {ACTION; IS/IS JJO:84166063} bothersome.  Bowel Symptom: Bowel movements: *** time(s) per {Time; day/week/month:13537} with history of diverticulosis Stool consistency: {stool consistency:24758} Straining: {yes/no:19897}.  Splinting: {yes/no:19897}.  Incomplete evacuation: {yes/no:19897}.  Patient {denies/ admits to:24761} accidental bowel leakage / fecal incontinence  Occurs: *** time(s) per {Time; day/week/month:13537}  Consistency with leakage: {stool consistency:24758} Bowel regimen: {bowel regimen:24759} Last colonoscopy: Date ***, Results *** [facility]   This patient has no  relevant Health Maintenance data.     Sexual Function Sexually active: {yes/no:19897}.  Sexual orientation: {Sexual [phone]} Pain with sex: {pain with sex:24762}  Pelvic Pain {denies/ admits to:24761} pelvic pain Location: *** Pain occurs: *** Prior pain treatment: *** Improved by: *** Worsened by: ***   Past Medical History:  Past Medical History:  Diagnosis Date   Anxiety    Arthritis    CLL (chronic lymphocytic leukemia) (HCC)    Complication of anesthesia    Depression    Depression with anxiety    Diverticulosis    Dyspnea    GERD (gastroesophageal reflux disease)    Hearing loss    Memory loss    Mild cognitive impairment    Osteopenia    PONV (postoperative nausea and vomiting)    Reflux    SOB (shortness of breath)      Past Surgical History:   Past Surgical History:  Procedure Laterality Date   ABDOMINAL HYSTERECTOMY     APPENDECTOMY     BREAST EXCISIONAL BIOPSY Left    CARDIAC EVENT MONITOR  02/03/2005   30 days, normal event monitor   CATARACT EXTRACTION Bilateral    EYE SURGERY     squamous cell carcinoma surgery in right eye lid.   KNEE ARTHROSCOPY Right 07/02/2016   Procedure: ARTHROSCOPY KNEE WITH DEBRIDEMENT, PARTIAL LATERAL AND MEDIAL MENISCECTOMY, with drainage of cyst;  Surgeon: Jene Every, MD;  Location: [facility];  Service: Orthopedics;  Laterality: Right;   NM GATED MYOCARDIAL STUDY (ARMX HX)  01/23/2011   Normal pattern of perfusion in all regions. post-stress EF is 85%. EKG negative for ischemia. no ECG changes.   TONSILLECTOMY     TRANSTHORACIC ECHOCARDIOGRAM  10/31/2010   EF >55%, there is concern for inferoacpical infarct with possible inferoapical aneurysm or pseudoaneurysm -  could be echo artificat     Past OB/GYN History: OB History  No obstetric history on file.    Vaginal deliveries: ***,  Forceps/ Vacuum deliveries: ***, Cesarean section: *** Menopausal: {menopausal:24763} Contraception: ***. Last pap  smear was ***.  Any history of abnormal pap smears: {yes/no:19897}. No results found for: "DIAGPAP", "HPVHIGH", "ADEQPAP"  Medications: Patient has a current medication list which includes the following prescription(s): atorvastatin, cyclosporine, desvenlafaxine, docusate sodium, estradiol, lorazepam, multivitamin, multiple vitamins-minerals, myrbetriq, nitrofurantoin, nitrofurantoin (macrocrystal-monohydrate), oxycodone, pantoprazole, solifenacin, zetia, and zolpidem.   Allergies: Patient is allergic to hydrocodone, azithromycin, valium [diazepam], adhesive [tape], codeine, doxycycline, keflex [cephalexin], and sulfa antibiotics.   Social History:  Social History   Tobacco Use   Smoking status: Never   Smokeless tobacco: Never  Vaping Use   Vaping status: Never Used  Substance Use Topics   Alcohol use: No   Drug use: No    Relationship status: {relationship status:24764} Patient lives with ***.   Patient {ACTION; IS/IS ZOX:09604540} employed ***. Regular exercise: {Yes/No:304960894} History of abuse: {Yes/No:304960894}  Family History:   Family History  Problem Relation Age of Onset   Alzheimer's disease Father    Dementia Father    Atrial fibrillation Brother    Dementia Maternal Grandmother    Dementia Paternal Grandmother      Review of Systems: ROS   OBJECTIVE Physical Exam: There were no vitals filed for this visit.  Physical Exam   GU / Detailed Urogynecologic Evaluation:  Pelvic Exam: Normal external female genitalia; Bartholin's and Skene's glands normal in appearance; urethral meatus normal in appearance, no urethral masses or discharge.   CST: {gen negative/positive:315881}  Reflexes: bulbocavernosis {DESC; PRESENT/NOT PRESENT:21021351}, anocutaneous {DESC; PRESENT/NOT PRESENT:21021351} ***bilaterally.  Speculum exam reveals normal vaginal mucosa {With/Without:20273} atrophy. Cervix {exam; gyn cervix:30847}. Uterus {exam; pelvic uterus:30849}. Adnexa  {exam; adnexa:12223}.    s/p hysterectomy: Speculum exam reveals normal vaginal mucosa {With/Without:20273}  atrophy and normal vaginal cuff.  Adnexa {exam; adnexa:12223}.    With apex supported, anterior compartment defect was {reduced:24765}  Pelvic floor strength {Roman # I-V:19040}/V, puborectalis {Roman # I-V:19040}/V external anal sphincter {Roman # I-V:19040}/V  Pelvic floor musculature: Right levator {Tender/Non-tender:20250}, Right obturator {Tender/Non-tender:20250}, Left levator {Tender/Non-tender:20250}, Left obturator {Tender/Non-tender:20250}  POP-Q:   POP-Q                                               Aa                                               Ba                                                 C                                                Gh  Pb                                               tvl                                                Ap                                               Bp                                                 D      Rectal Exam:  Normal sphincter tone, {rectocele:24766} distal rectocele, enterocoele {DESC; PRESENT/NOT PRESENT:21021351}, no rectal masses, {sign of:24767} dyssynergia when asking the patient to bear down.  Post-Void Residual (PVR) by Bladder Scan: In order to evaluate bladder emptying, we discussed obtaining a postvoid residual and patient agreed to this procedure.  Procedure: The ultrasound unit was placed on the patient's abdomen in the suprapubic region after the patient had voided.      Laboratory Results: Lab Results  Component Value Date   BILIRUBINUR NEGATIVE 10/21/2020   PROTEINUR 30 (A) 10/21/2020   UROBILINOGEN 0.2 04/23/2014   LEUKOCYTESUR LARGE (A) 10/21/2020    Lab Results  Component Value Date   CREATININE 0.70 08/29/2022   CREATININE 0.72 09/18/2021   CREATININE 0.79 03/26/2021    No results found for: "HGBA1C"  Lab Results   Component Value Date   HGB 14.3 08/29/2022     ASSESSMENT AND PLAN Ms. Wuellner is a 89 y.o. with: No diagnosis found.  There are no diagnoses linked to this encounter.   Loleta Chance, MD

## 2023-11-05 ENCOUNTER — Encounter: Payer: Self-pay | Admitting: Obstetrics

## 2023-11-05 ENCOUNTER — Ambulatory Visit: Admitting: Obstetrics

## 2023-11-05 VITALS — BP 164/84 | HR 72 | Ht <= 58 in | Wt 168.2 lb

## 2023-11-05 DIAGNOSIS — Z87440 Personal history of urinary (tract) infections: Secondary | ICD-10-CM | POA: Diagnosis not present

## 2023-11-05 DIAGNOSIS — C9100 Acute lymphoblastic leukemia not having achieved remission: Secondary | ICD-10-CM | POA: Diagnosis not present

## 2023-11-05 DIAGNOSIS — N3946 Mixed incontinence: Secondary | ICD-10-CM

## 2023-11-05 DIAGNOSIS — N362 Urethral caruncle: Secondary | ICD-10-CM | POA: Insufficient documentation

## 2023-11-05 DIAGNOSIS — R413 Other amnesia: Secondary | ICD-10-CM | POA: Diagnosis not present

## 2023-11-05 DIAGNOSIS — R194 Change in bowel habit: Secondary | ICD-10-CM | POA: Diagnosis not present

## 2023-11-05 DIAGNOSIS — M810 Age-related osteoporosis without current pathological fracture: Secondary | ICD-10-CM | POA: Diagnosis not present

## 2023-11-05 LAB — POCT URINALYSIS DIPSTICK
Bilirubin, UA: NEGATIVE
Blood, UA: NEGATIVE
Glucose, UA: NEGATIVE
Leukocytes, UA: NEGATIVE
Nitrite, UA: NEGATIVE
Protein, UA: NEGATIVE
Spec Grav, UA: 1.02 (ref 1.010–1.025)
Urobilinogen, UA: 0.2 U/dL
pH, UA: 5.5 (ref 5.0–8.0)

## 2023-11-05 MED ORDER — GEMTESA 75 MG PO TABS: 75.0000 mg | ORAL_TABLET | Freq: Every day | ORAL | 2 refills | Status: DC

## 2023-11-05 MED ORDER — LIDOCAINE 5 % EX OINT: TOPICAL_OINTMENT | CUTANEOUS | 0 refills | Status: AC

## 2023-11-05 MED ORDER — GEMTESA 75 MG PO TABS: 75.0000 mg | ORAL_TABLET | Freq: Every day | ORAL | Status: DC

## 2023-11-05 NOTE — Assessment & Plan Note (Addendum)
-   will not use anti-muscarinics due to age and MCI - patient to review medication bottles and discontinue oxybutynin and solifenacin.

## 2023-11-05 NOTE — Assessment & Plan Note (Addendum)
-   last urine culture 09/28/23 >100K E. Coli resistant to ampicillin, intermediate to levaquin. UA +leuk/protein/heme,  > 30 RBCs - catheterized urine + ketones only, pending pathnostics due to intermittent urethral pain and history of rUTI.  - prior macrodantin daily for suppression - denies stones, pyelonephritis  - For treatment of recurrent urinary tract infections, we discussed management of recurrent UTIs including prophylaxis with a daily low dose antibiotic, transvaginal estrogen therapy, D-mannose, and cranberry supplements.  We discussed the role of diagnostic testing such as cystoscopy and upper tract imaging.   - will review results from [facility] - discussed possible repeat cystoscopy and renal imaging if no recent testing available for review - continue estring, discussed reduction of topical vaginal estrogen to 0.5g twice a week over urethral meatus and vulva.  - reviewed anatomy and discussed urethral caruncle with slight increase size during valsalva - encouraged bowel regiment to reduce straining - discussed NSAIDs, tylenol for UTI symptoms. Pt to present to office for testing if she experiences symptoms. Can use pyridium PRN symptoms - pt to call call if you continue to experience persistent or worsening urinary symptoms such as fever > 100.4, nausea/vomiting, one sided back pain or blood in urine that requires urgent evaluation - discontinue if gemtesa increases frequency of UTI, unclear if patient is currently taking oxybutynin or solifenacin. Patient to review at home and bring med bottles for review at follow-up.

## 2023-11-05 NOTE — Assessment & Plan Note (Signed)
-   reviewed anatomy - 3-51mm polypoid lesion from posterior meatus - recheck PVR at follow-up. - For symptomatic vaginal atrophy options include lubrication with a water-based lubricant, personal hygiene measures and barrier protection against wetness, and estrogen replacement in the form of vaginal cream, vaginal tablets, or a time-released vaginal ring.   - continue estring use, reduce topical estrogen to urethral meatus use

## 2023-11-05 NOTE — Assessment & Plan Note (Addendum)
-   unable to void, catheterized for 40mL - h/o fall with L1 compression fracture - Tried oxybutynin, solifenacin, mirabegron 50mg  in the past, Gemtesa with relief by Dr. Sherron Monday however unclear timing of discontinuation. - anti-muscarinics contraindicated due to age and MCI, mirabegron due to HTN - mostly insensible, ROI signed to review most recent cystoscopy, urodynamics and renal imaging.  We discussed the symptoms of overactive bladder (OAB), which include urinary urgency, urinary frequency, nocturia, with or without urge incontinence.  While we do not know the exact etiology of OAB, several treatment options exist. We discussed management including behavioral therapy (decreasing bladder irritants, urge suppression strategies, timed voids, bladder retraining), physical therapy, medication; for refractory cases posterior tibial nerve stimulation, sacral neuromodulation, and intravesical botulinum toxin injection.  For anticholinergic medications, we discussed the potential side effects of anticholinergics including dry eyes, dry mouth, constipation, cognitive impairment and urinary retention. For Beta-3 agonist medication, we discussed the potential side effect of elevated blood pressure which is more likely to occur in individuals with uncontrolled hypertension. - samples and Rx provided for gemtesa to resume - For treatment of stress urinary incontinence,  non-surgical options include expectant management, weight loss, physical therapy, as well as a pessary.  Surgical options include a midurethral sling, Burch urethropexy, and transurethral injection of a bulking agent.

## 2023-11-05 NOTE — Patient Instructions (Addendum)
 Mixed Incontinence (MUI):  MUI includes symptoms of both Overactive bladder (OAB) and stress incontinence (SUI).    Overactive bladder (OAB) causes bladder urgency, frequency, and having to void at night with or without leakage.  Several  treatment options exist, including behavioral changes (avoiding caffeine, etc), physical therapy, medications, and neuromodulation (ways to change the nerve signals to the bladder).   For Beta-3 agonist medication, we discussed the potential side effect of elevated blood pressure which is more likely to occur in individuals with uncontrolled hypertension. You were given samples for Gemtesa 75 mg.  It can take a month to start working so give it time, but if you have bothersome side effects call sooner and we can try a different medication.  Call us if you have trouble filling the prescription or if it's not covered by your insurance.  Stress incontinence (SUI) causes urinary leakage with coughing, laughing, sneezing and occasionally during exercise or bending/lifting.  Treatment options include nonsurgical options such as Kegel (pelvic floor) exercises, physical therapy, pessary (vaginal device similar to a diaphragm to prevent leakage) or surgical options such as a midurethral sling.   For treatment of recurrent urinary tract infections, we discussed management of recurrent UTIs including prophylaxis with a daily low dose antibiotic, transvaginal estrogen therapy, D-mannose, and cranberry supplements.  We discussed the role of diagnostic testing such as cystoscopy and upper tract imaging.     Please call if you continue to experience persistent or worsening urinary symptoms such as fever > 100.4, nausea/vomiting, one sided back pain or blood in your urine.   For vaginal atrophy (thinning of the vaginal tissue that can cause dryness and burning) and UTI prevention we discussed estrogen replacement in the form of vaginal cream.   Continue estring replacement every 3  months Reduce vaginal estrogen therapy to 0.5g 2 times weekly at night over the urethra opening   Is vaginal estrogen therapy safe for me? Vaginal estrogen preparations act on the vaginal skin, and only a very tiny amount is absorbed into the bloodstream (0.01%).  They work in a similar way to hand or face cream.  There is minimal absorption and they are therefore perfectly safe. If you have had breast cancer and have persistent troublesome symptoms which aren't settling with vaginal moisturisers and lubricants, local estrogen treatment may be a possibility, but consultation with your oncologist should take place first.   - discussed proper vulvar care, warm compression, avoid pad use, cotton only underwear and barrier ointment if needed   Continue probiotics  Women should try to eat at least 21 to 25 grams of fiber a day, while men should aim for 30 to 38 grams a day. You can add fiber to your diet with food or a fiber supplement such as psyllium (metamucil), benefiber, or fibercon.   Here's a look at how much dietary fiber is found in some common foods. When buying packaged foods, check the Nutrition Facts label for fiber content. It can vary among brands.  Fruits Serving size Total fiber (grams)*  Raspberries 1 cup 8.0  Pear 1 medium 5.5  Apple, with skin 1 medium 4.5  Banana 1 medium 3.0  Orange 1 medium 3.0  Strawberries 1 cup 3.0   Vegetables Serving size Total fiber (grams)*  Green peas, boiled 1 cup 9.0  Broccoli, boiled 1 cup chopped 5.0  Turnip greens, boiled 1 cup 5.0  Brussels sprouts, boiled 1 cup 4.0  Potato, with skin, baked 1 medium 4.0  Sweet corn, boiled  1 cup 3.5  Cauliflower, raw 1 cup chopped 2.0  Carrot, raw 1 medium 1.5   Grains Serving size Total fiber (grams)*  Spaghetti, whole-wheat, cooked 1 cup 6.0  Barley, pearled, cooked 1 cup 6.0  Bran flakes 3/4 cup 5.5  Quinoa, cooked 1 cup 5.0  Oat bran muffin 1 medium 5.0  Oatmeal, instant, cooked 1 cup 5.0   Popcorn, air-popped 3 cups 3.5  Brown rice, cooked 1 cup 3.5  Bread, whole-wheat 1 slice 2.0  Bread, rye 1 slice 2.0   Legumes, nuts and seeds Serving size Total fiber (grams)*  Split peas, boiled 1 cup 16.0  Lentils, boiled 1 cup 15.5  Black beans, boiled 1 cup 15.0  Baked beans, canned 1 cup 10.0  Chia seeds 1 ounce 10.0  Almonds 1 ounce (23 nuts) 3.5  Pistachios 1 ounce (49 nuts) 3.0  Sunflower kernels 1 ounce 3.0  *Rounded to nearest 0.5 gram. Source: [employer] for Standard Reference, Legacy Release    We will review records from [facility] for your most recent bladder testing and cystoscopy. If they have been more than 1-2 years, we may consider repeating testing.

## 2023-11-05 NOTE — Assessment & Plan Note (Signed)
-   reports straining at the end of bowel movements - trial of bowel regimen to optimize stool consistency.  We also discussed the importance of avoiding chronic straining, as it can exacerbate her pelvic floor symptoms. We discussed initiating therapy with fiber supplementation

## 2023-11-09 ENCOUNTER — Ambulatory Visit (INDEPENDENT_AMBULATORY_CARE_PROVIDER_SITE_OTHER)

## 2023-11-09 ENCOUNTER — Other Ambulatory Visit (HOSPITAL_COMMUNITY)
Admission: RE | Admit: 2023-11-09 | Discharge: 2023-11-09 | Disposition: A | Discharge status: Home / Self Care | Source: Other Acute Inpatient Hospital | Attending: Obstetrics and Gynecology | Admitting: Obstetrics and Gynecology

## 2023-11-09 VITALS — BP 130/78 | HR 78

## 2023-11-09 DIAGNOSIS — R350 Frequency of micturition: Secondary | ICD-10-CM | POA: Diagnosis not present

## 2023-11-09 NOTE — Progress Notes (Signed)
 Brenda [name] is a 89 y.o. female arrived today with UTI sx.  A urine specimen was collected and POCT Urine was not done because the patient is on AZO. Urine was sent for culture   Pt was notified we will call her when the urine returns. Pt verbalized understanding

## 2023-11-09 NOTE — Patient Instructions (Signed)
 Your Urine dip that was done in office was not done due to being on AZO. I am sending the urine off for culture and you can take AZO over the counter for your discomfort.  We will contact you when the results are back between 3-5 days.  If a different antibiotic is needed we will sent the order to the pharmacy and you will be notified. If you have any questions or concerns please feel free to call us at [phone]

## 2023-11-09 NOTE — Addendum Note (Signed)
 Addended by: Sammuel Cooper on: 11/09/2023 02:44 PM   Modules accepted: Orders

## 2023-11-10 ENCOUNTER — Telehealth: Payer: Self-pay

## 2023-11-10 NOTE — Telephone Encounter (Signed)
 Patients Pathnostic test was cancelled dut to [employer] not picking up the sample from the pickup box. She was seen for a nurse visit yesterday and has a pending urine culture. Will await results.

## 2023-11-11 ENCOUNTER — Other Ambulatory Visit: Payer: Self-pay | Admitting: Obstetrics

## 2023-11-11 DIAGNOSIS — Z87440 Personal history of urinary (tract) infections: Secondary | ICD-10-CM

## 2023-11-11 MED ORDER — NITROFURANTOIN MONOHYD MACRO 100 MG PO CAPS: 100.0000 mg | ORAL_CAPSULE | Freq: Two times a day (BID) | ORAL | 0 refills | Status: AC

## 2023-11-12 ENCOUNTER — Other Ambulatory Visit (HOSPITAL_COMMUNITY): Payer: Self-pay | Admitting: *Deleted

## 2023-11-12 LAB — URINE CULTURE: Culture: >=100000 — AB

## 2023-11-15 ENCOUNTER — Encounter (HOSPITAL_COMMUNITY)

## 2023-11-23 ENCOUNTER — Ambulatory Visit (HOSPITAL_COMMUNITY)
Admission: RE | Admit: 2023-11-23 | Discharge: 2023-11-23 | Disposition: A | Discharge status: Home / Self Care | Source: Ambulatory Visit | Attending: Internal Medicine | Admitting: Internal Medicine

## 2023-11-23 DIAGNOSIS — M810 Age-related osteoporosis without current pathological fracture: Secondary | ICD-10-CM | POA: Diagnosis not present

## 2023-11-23 MED ORDER — ZOLEDRONIC ACID 5 MG/100ML IV SOLN: 5.0000 mg | Freq: Once | INTRAVENOUS | Status: AC

## 2023-11-23 MED ORDER — ZOLEDRONIC ACID 5 MG/100ML IV SOLN
INTRAVENOUS | Status: AC
  Administered 2023-11-23: 5 mg via INTRAVENOUS
  Filled 2023-11-23: qty 100

## 2023-11-24 DIAGNOSIS — R600 Localized edema: Secondary | ICD-10-CM | POA: Diagnosis not present

## 2023-11-24 DIAGNOSIS — R32 Unspecified urinary incontinence: Secondary | ICD-10-CM | POA: Diagnosis not present

## 2023-11-24 DIAGNOSIS — R7301 Impaired fasting glucose: Secondary | ICD-10-CM | POA: Diagnosis not present

## 2023-11-24 DIAGNOSIS — M810 Age-related osteoporosis without current pathological fracture: Secondary | ICD-10-CM | POA: Diagnosis not present

## 2023-11-24 DIAGNOSIS — F329 Major depressive disorder, single episode, unspecified: Secondary | ICD-10-CM | POA: Diagnosis not present

## 2023-11-24 DIAGNOSIS — C9100 Acute lymphoblastic leukemia not having achieved remission: Secondary | ICD-10-CM | POA: Diagnosis not present

## 2023-11-24 DIAGNOSIS — B370 Candidal stomatitis: Secondary | ICD-10-CM | POA: Diagnosis not present

## 2023-11-24 DIAGNOSIS — R0600 Dyspnea, unspecified: Secondary | ICD-10-CM | POA: Diagnosis not present

## 2023-11-24 DIAGNOSIS — E041 Nontoxic single thyroid nodule: Secondary | ICD-10-CM | POA: Diagnosis not present

## 2023-11-24 DIAGNOSIS — E785 Hyperlipidemia, unspecified: Secondary | ICD-10-CM | POA: Diagnosis not present

## 2023-11-24 DIAGNOSIS — J449 Chronic obstructive pulmonary disease, unspecified: Secondary | ICD-10-CM | POA: Diagnosis not present

## 2023-11-24 DIAGNOSIS — K146 Glossodynia: Secondary | ICD-10-CM | POA: Diagnosis not present

## 2023-11-25 NOTE — Progress Notes (Unsigned)
 [facility] Return Visit  SUBJECTIVE  History of Present Illness: Brenda [name] is a 89 y.o. female seen in follow-up for mixed urinary incontinence, history of recurrent UTI, frequent bowel movements, and urethral caruncle. Plan at last visit was trial of Gemtesa, continue estring, fiber supplementation, topical lidocaine and topical estrogen use.   11/09/23 urine culture > 100K Klebsiella pneumoniae resistant to ampicillin and nitrofurantoin. Rx macrobid ***Cipro Medications for review*** stop oxybutynin and solifenacin H/o SPT in 2020? UDS 04/27/19 1st sens 720, normal desire 767, 834 strong desire. VLPP 82. Voided 841, Qmax 8, PVR 30. Valsalva void. Pdet at Omax 18. No reflux Sling in [place] 2004, cysto in 2020 Tried trimpex, cipro suppression  Past Medical History: Patient  has a past medical history of Anxiety, Arthritis, CLL (chronic lymphocytic leukemia) (HCC), Complication of anesthesia, Depression, Depression with anxiety, Diverticulosis, Dyspnea, GERD (gastroesophageal reflux disease), Hearing loss, Memory loss, Mild cognitive impairment, Osteopenia, PONV (postoperative nausea and vomiting), Reflux, and SOB (shortness of breath).   Past Surgical History: She  has a past surgical history that includes Appendectomy; Abdominal hysterectomy; Cataract extraction (Bilateral); transthoracic echocardiogram (10/31/2010); NM GATED MYOCARDIAL STUDY ([facility] HX) (01/23/2011); CARDIAC EVENT MONITOR (02/03/2005); Tonsillectomy; Eye surgery; Knee arthroscopy (Right, 07/02/2016); and Breast excisional biopsy (Left).   Medications: She has a current medication list which includes the following prescription(s): anoro ellipta, atorvastatin, cyclosporine, desvenlafaxine, estradiol, lidocaine, lorazepam, multivitamin, multiple vitamins-minerals, gemtesa, gemtesa, zetia, and zolpidem.   Allergies: Patient is allergic to hydrocodone, azithromycin, valium [diazepam], adhesive [tape],  codeine, doxycycline, keflex [cephalexin], and sulfa antibiotics.   Social History: Patient  reports that she has never smoked. She has never used smokeless tobacco. She reports that she does not drink alcohol and does not use drugs.     OBJECTIVE     Physical Exam: There were no vitals filed for this visit. Gen: No apparent distress, A&O x 3.  Detailed Urogynecologic Evaluation:  Deferred. Prior exam showed:      No data to display             ASSESSMENT AND PLAN    Brenda [name] is a 89 y.o. with:  No diagnosis found.  There are no diagnoses linked to this encounter.   Loleta Chance, MD

## 2023-11-26 ENCOUNTER — Ambulatory Visit: Admitting: Obstetrics

## 2023-12-01 ENCOUNTER — Ambulatory Visit: Payer: Medicare Other | Admitting: Internal Medicine

## 2023-12-06 ENCOUNTER — Ambulatory Visit (INDEPENDENT_AMBULATORY_CARE_PROVIDER_SITE_OTHER): Admitting: Internal Medicine

## 2023-12-06 ENCOUNTER — Encounter: Payer: Self-pay | Admitting: Internal Medicine

## 2023-12-06 VITALS — BP 120/70 | HR 87 | Ht <= 58 in | Wt 168.0 lb

## 2023-12-06 DIAGNOSIS — E042 Nontoxic multinodular goiter: Secondary | ICD-10-CM | POA: Diagnosis not present

## 2023-12-06 NOTE — Progress Notes (Signed)
 Name: Brenda [name]  MRN/ DOB: [id number], April 02, 1932    Age/ Sex: 89 y.o., female    PCP: Rodrigo Ran, MD   Reason for Endocrinology Evaluation: MNG     Date of Initial Endocrinology Evaluation: 06/10/2022    HPI: Brenda [name] is a 89 y.o. female with a past medical history of MNG, Hx of compression fracture and GAD. The patient presented for initial endocrinology clinic visit on 06/10/2022 for consultative assistance with her MNG.   Patient had an incidental finding of thyroid nodules on CT scan which prompted thyroid ultrasound.  Patient had thyroid ultrasound in February 2023 revealing multinodular goiter with 3.2 cm isthmic nodule meeting FNA criteria.   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   Pt has been following up with Urogyn for mixed urinary incontinence and frequent Uti's Denies local neck swelling  Denies palpitations Denies tremors  Denies diarrhea or loose stools  Continues with burning tongue      HISTORY:  Past Medical History:  Past Medical History:  Diagnosis Date   Anxiety    Arthritis    CLL (chronic lymphocytic leukemia) (HCC)    Complication of anesthesia    Depression    Depression with anxiety    Diverticulosis    Dyspnea    GERD (gastroesophageal reflux disease)    Hearing loss    Memory loss    Mild cognitive impairment    Osteopenia    PONV (postoperative nausea and vomiting)    Reflux    SOB (shortness of breath)    Past Surgical History:  Past Surgical History:  Procedure Laterality Date   ABDOMINAL HYSTERECTOMY     APPENDECTOMY     BREAST EXCISIONAL BIOPSY Left    CARDIAC EVENT MONITOR  02/03/2005   30 days, normal event monitor   CATARACT  EXTRACTION Bilateral    EYE SURGERY     squamous cell carcinoma surgery in right eye lid.   KNEE ARTHROSCOPY Right 07/02/2016   Procedure: ARTHROSCOPY KNEE WITH DEBRIDEMENT, PARTIAL LATERAL AND MEDIAL MENISCECTOMY, with drainage of cyst;  Surgeon: Jene Every, MD;  Location: [facility];  Service: Orthopedics;  Laterality: Right;   NM GATED MYOCARDIAL STUDY ([facility] HX)  01/23/2011   Normal pattern of perfusion in all regions. post-stress EF is 85%. EKG negative for ischemia. no ECG changes.   TONSILLECTOMY     TRANSTHORACIC ECHOCARDIOGRAM  10/31/2010   EF >55%, there is concern for inferoacpical infarct with possible inferoapical aneurysm or pseudoaneurysm - could be echo artificat    Social History:  reports that she has never smoked. She has never used smokeless tobacco. She reports that she does not drink alcohol and does not use drugs. Family History: family history includes Alzheimer's disease in her father; Atrial fibrillation in her brother; Colon cancer in her father; Dementia in her father, maternal grandmother, and paternal grandmother.   HOME MEDICATIONS: Allergies as of 12/06/2023       Reactions   Hydrocodone Nausea And Vomiting   Azithromycin    Valium [diazepam] Other (See Comments)   Disoriented   Adhesive [tape] Rash   Codeine Nausea And Vomiting   Doxycycline Rash  Severe Reflux , cheek "flushing"   Keflex [cephalexin] Rash   Sulfa Antibiotics Rash        Medication List        Accurate as of December 06, 2023  7:55 AM. If you have any questions, ask your nurse or doctor.          Anoro Ellipta 62.5-25 MCG/ACT Aepb Generic drug: umeclidinium-vilanterol Inhale 1 puff into the lungs daily.   atorvastatin 20 MG tablet Commonly known as: LIPITOR Take 20 mg by mouth daily.   cycloSPORINE 0.05 % ophthalmic emulsion Commonly known as: RESTASIS Place 1 drop into both eyes 2 (two) times daily.   desvenlafaxine 50 MG 24 hr tablet Commonly known as: PRISTIQ Take  50 mg by mouth daily.   estradiol 2 MG vaginal ring Commonly known as: ESTRING Place 2 mg vaginally every 3 (three) months. follow package directions   Gemtesa 75 MG Tabs Generic drug: Vibegron Take 1 tablet (75 mg total) by mouth daily.   Gemtesa 75 MG Tabs Generic drug: Vibegron Take 1 tablet (75 mg total) by mouth daily.   lidocaine 5 % ointment Commonly known as: XYLOCAINE Apply 0.5g (peasize) over urethra if you experience pain   LORazepam 0.5 MG tablet Commonly known as: ATIVAN Take 0.5 mg by mouth at bedtime.   multivitamin capsule Take 1 capsule by mouth daily.   PRESERVISION AREDS 2+MULTI VIT PO Take 1 capsule by mouth daily.   Zetia 10 MG tablet Generic drug: ezetimibe Take 10 mg by mouth at bedtime.   zolpidem 5 MG tablet Commonly known as: AMBIEN Take 5 mg by mouth at bedtime.          REVIEW OF SYSTEMS: A comprehensive ROS was conducted with the patient and is negative except as per HPI    OBJECTIVE:  VS: There were no vitals taken for this visit.   Wt Readings from Last 3 Encounters:  11/05/23 168 lb 3.2 oz (76.3 kg)  12/01/22 174 lb (78.9 kg)  06/10/22 180 lb (81.6 kg)     EXAM: General: Pt appears well and is in NAD  Neck: General: Supple without adenopathy. Thyroid: Isthmic nodule appreciated  Lungs: Clear with good BS bilat   Heart: Auscultation: RRR.  Extremities:  BL LE: No pretibial edema   Mental Status: Judgment, insight: Intact Orientation: Oriented to time, place, and person Mood and affect: No depression, anxiety, or agitation     DATA REVIEWED:    Latest Reference Range & Units 12/01/22 14:07  TSH 0.35 - 5.50 uIU/mL 2.35  T4,Free(Direct) 0.60 - 1.60 ng/dL 1.61      Thyroid Ultrasound 12/31/2022     Estimated total number of nodules >/= 1 cm: 2   Number of spongiform nodules >/=  2 cm not described below (TR1): 0   Number of mixed cystic and solid nodules >/= 1.5 cm not described below (TR2): 0    _________________________________________________________   Nodule labeled 1 is a large heterogeneous nodule in the thyroid isthmus measuring up to 3.6 cm on today's exam, previously measuring up to 3.5 cm when measured in a similar fashion at the time of biopsy. It remains grossly similar in size and morphology.   Nodule labeled 2 is a small spongiform appearing TR 1 nodule in the right thyroid lobe measuring up to 1.0 cm. This nodule does NOT meet TI-RADS criteria for biopsy or dedicated follow-up.   Nodule labeled 3 is a solid hypoechoic TR 4 nodule in the left thyroid lobe  measuring 1.3 x 0.9 x 0.9 cm. This nodule does not appear to have been discretely described on previous examination. *Given size (>/= 1 - 1.4 cm) and appearance, a follow-up ultrasound in 1 year should be considered based on TI-RADS criteria.   IMPRESSION: 1. Multinodular thyroid gland. 2. Nodule labeled 1 in the thyroid isthmus has been previously biopsied, and remains similar in size and morphology. Correlate with biopsy results. 3. Nodule labeled 3 in the left thyroid lobe (1.3 cm TR 4) meets criteria for follow-up ultrasound in 1 year. This nodule has not been previously described, and a 5 year surveillance period is recommended, with this exam serving as a baseline.  FNA 3.2 cm isthmic nodule 02/12/2022  Clinical History: Nodule #1: Isthmus; Mid, Maximum size: 3.2 cm; Other 2  dimensions: 1.6 x 1.3 cm, solid/almost completely solid, isoechoic,  TI-RADS total points: 3.  Specimen Submitted:  A. THYROID, MID ISTHMUS, FINE NEEDLE ASPIRATION:    FINAL MICROSCOPIC DIAGNOSIS:  - Consistent with benign follicular nodule (Bethesda category II)      ASSESSMENT/PLAN/RECOMMENDATIONS:   Multinodular Goiter:  - No local neck symptoms  -TFT's have been normal  - She is S/P benign FNA of the isthmic 3.2 cm nodule in 01/2022 -Will repeat thyroid ultrasound  F/U in 1 yr   Signed electronically by: Lyndle Herrlich, MD  [facility]  [facility] [address] [place], [place] [address] Phone: [phone] FAX: [phone]   CC: Rodrigo Ran, MD [address] [place] [address] Phone: [phone] Fax: [phone]   Return to Endocrinology clinic as below: Future Appointments  Date Time Provider Department Center  12/06/2023  2:00 PM Laraya Pestka, Konrad Dolores, MD [facility] None  02/04/2024  1:40 PM Loleta Chance, MD [facility]

## 2023-12-10 ENCOUNTER — Ambulatory Visit
Admission: RE | Admit: 2023-12-10 | Discharge: 2023-12-10 | Disposition: A | Discharge status: Home / Self Care | Source: Ambulatory Visit | Attending: Internal Medicine | Admitting: Internal Medicine

## 2023-12-10 ENCOUNTER — Ambulatory Visit: Payer: Medicare Other | Admitting: Obstetrics

## 2023-12-10 DIAGNOSIS — E042 Nontoxic multinodular goiter: Secondary | ICD-10-CM

## 2023-12-10 DIAGNOSIS — E041 Nontoxic single thyroid nodule: Secondary | ICD-10-CM | POA: Diagnosis not present

## 2023-12-16 ENCOUNTER — Encounter: Payer: Self-pay | Admitting: Internal Medicine

## 2023-12-18 ENCOUNTER — Telehealth: Payer: Self-pay | Admitting: Obstetrics and Gynecology

## 2023-12-18 ENCOUNTER — Encounter: Payer: Self-pay | Admitting: Obstetrics and Gynecology

## 2023-12-18 MED ORDER — NITROFURANTOIN MONOHYD MACRO 100 MG PO CAPS: 100.0000 mg | ORAL_CAPSULE | Freq: Two times a day (BID) | ORAL | 0 refills | Status: AC

## 2023-12-18 NOTE — Telephone Encounter (Signed)
 Pt called answering service with complaints of constant burning with urination which has worsened over the last day. Now has lower abdominal pain in addition to dysuria. Denies fever, chills or back pain. Had recent treatment for UTI, susceptible to nitrofurantoin - will send course into pharmacy. Pt advised to call if symptoms do not improve.

## 2023-12-20 ENCOUNTER — Telehealth: Payer: Self-pay

## 2023-12-20 DIAGNOSIS — Z87440 Personal history of urinary (tract) infections: Secondary | ICD-10-CM

## 2023-12-20 MED ORDER — TRIMETHOPRIM 100 MG PO TABS: 100.0000 mg | ORAL_TABLET | Freq: Two times a day (BID) | ORAL | 2 refills | Status: DC

## 2023-12-20 NOTE — Telephone Encounter (Signed)
 Patient called to inform us  that she called over the weekend and spoke to Dr Frutoso Jing and was given antibiotics. She has been feeling better now and wants to know if she needs to move up her follow up scheduled for 02-04-2024

## 2023-12-20 NOTE — Telephone Encounter (Signed)
 Treated with Bactrim for presumed UTI, symptoms improved.  Continue Trimpex  daily for UTI suppression.   11/09/23 urine culture > 100K Klebsiella pneumoniae resistant to ampicillin and nitrofurantoin . Rx macrobid  Patient called on 12/18/23 with UTI symptoms and started macrobid  with improved symptoms.  H/o SPT in 2020? UDS 04/27/19 1st sens 720, normal desire 767, 834 strong desire. VLPP 82. Voided 841, Qmax 8, PVR 30. Valsalva void. Pdet at Omax 18. No reflux Sling in [place] 2004, cysto in 2020 Tried trimpex , cipro  suppression in the past.  Allergic with sulfa and Keflex with rash.  Rx to resume trimpex  for UTI suppression.   Office to review UTI symptoms.  Will Rx fosfomycin/cipro  prior to trimpex  for suppression if patient continues to report UTI symptoms. Please to call if experiences persistent or worsening urinary symptoms such as fever > 100.4, nausea/vomiting, one sided back pain or blood in urine.

## 2023-12-21 NOTE — Telephone Encounter (Signed)
 Patient has been notified and aware of the plan of care provided by Dr Aron Lard. She will reach out if symptoms continue.

## 2024-01-05 DIAGNOSIS — Z85828 Personal history of other malignant neoplasm of skin: Secondary | ICD-10-CM | POA: Diagnosis not present

## 2024-01-05 DIAGNOSIS — L309 Dermatitis, unspecified: Secondary | ICD-10-CM | POA: Diagnosis not present

## 2024-01-05 DIAGNOSIS — H61001 Unspecified perichondritis of right external ear: Secondary | ICD-10-CM | POA: Diagnosis not present

## 2024-01-07 DIAGNOSIS — H10413 Chronic giant papillary conjunctivitis, bilateral: Secondary | ICD-10-CM | POA: Diagnosis not present

## 2024-01-15 IMAGING — US US FNA BIOPSY THYROID 1ST LESION
1 series · 11 of 11 positions shown · non-contrast
Comparison: Ultrasound thyroid 10/17/2021

MEDICATIONS:
1% lidocaine 2 mL

COMPLICATIONS:
None immediate.

INDICATION: Indeterminate isthmus thyroid nodule

EXAM:
ULTRASOUND GUIDED FINE NEEDLE ASPIRATION OF INDETERMINATE THYROID
NODULE
TECHNIQUE: Informed written consent was obtained from the patient after a
discussion of the risks, benefits and alternatives to treatment.
Questions regarding the procedure were encouraged and answered. A
timeout was performed prior to the initiation of the procedure.

[Series 1: us fna biopsy thyroid 1st lesion · 0.07mm/px · 11 acquisitions, 11 frames shown]
[im 1/11]
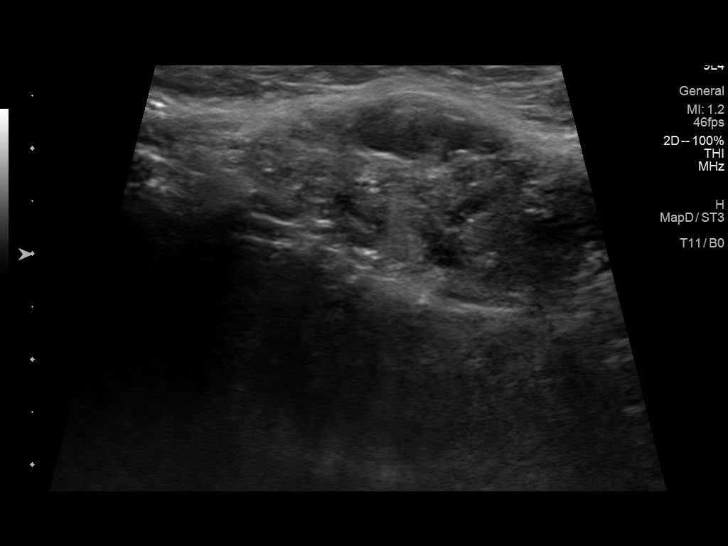
[im 2/11]
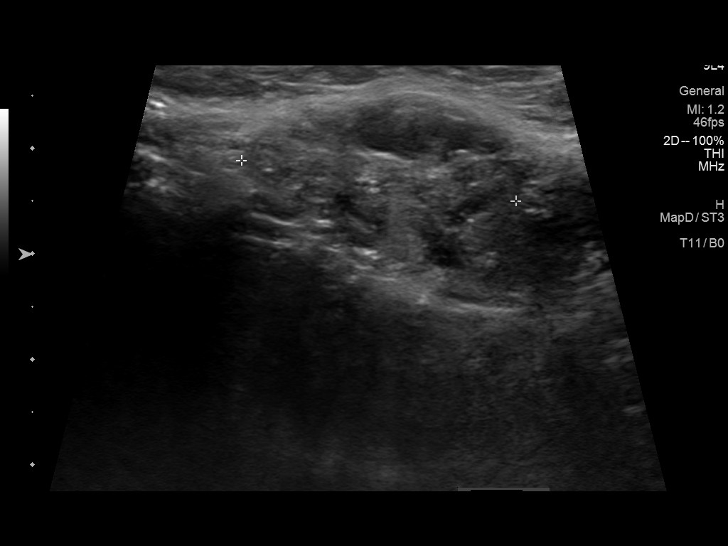
[im 3/11]
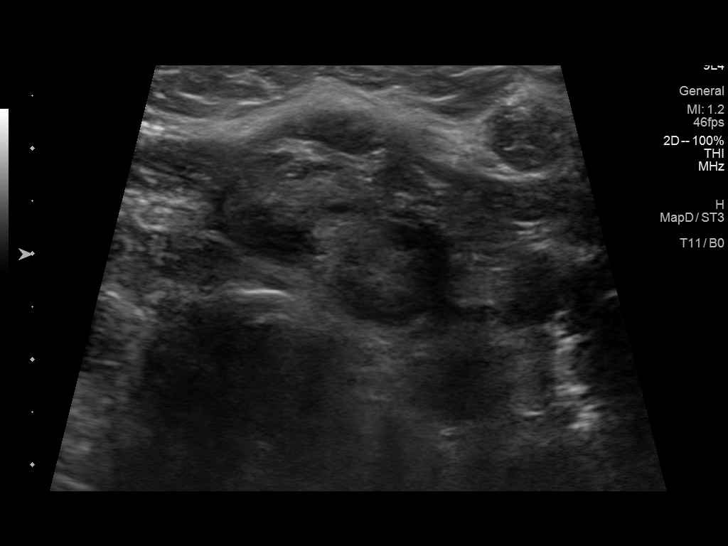
[im 4/11]
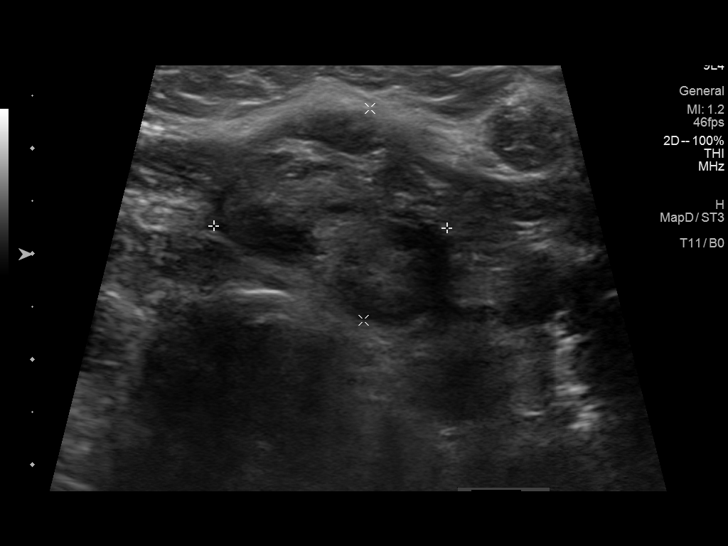
[im 5/11]
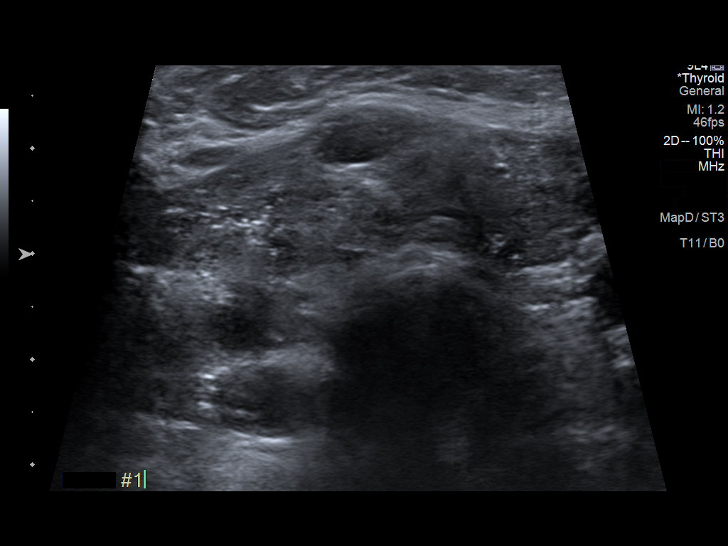
[im 6/11]
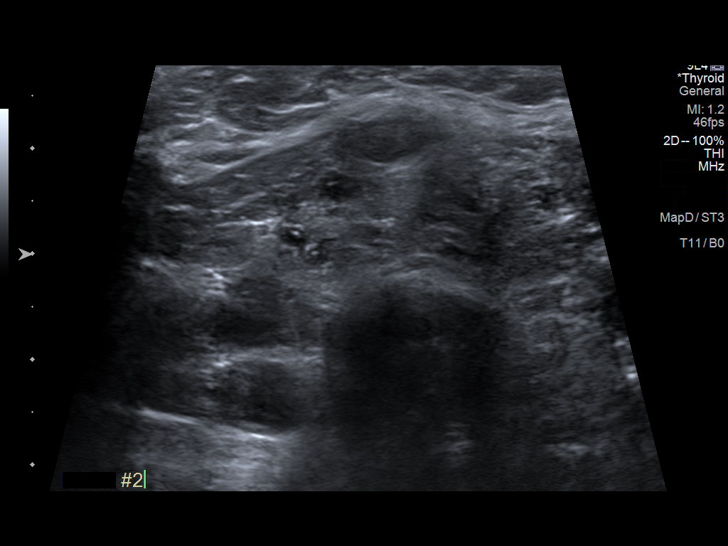
[im 7/11]
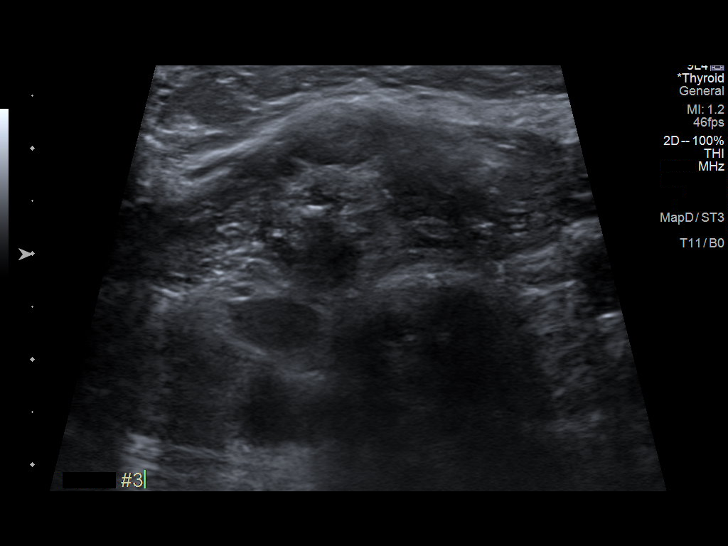
[im 8/11]
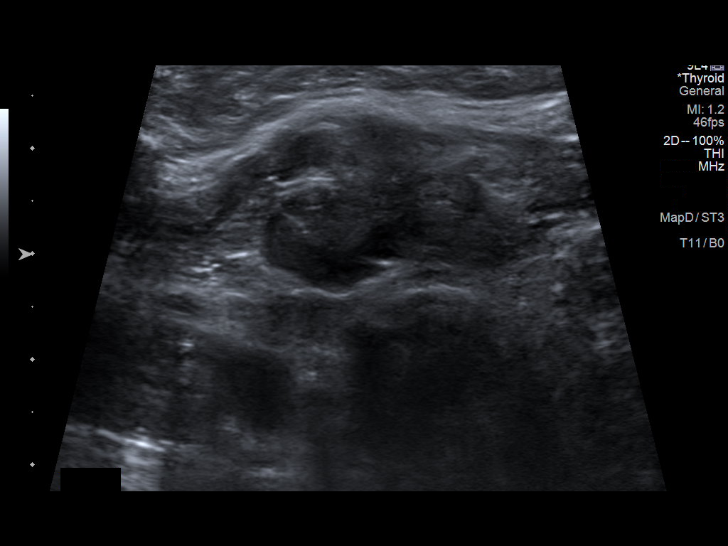
[im 9/11]
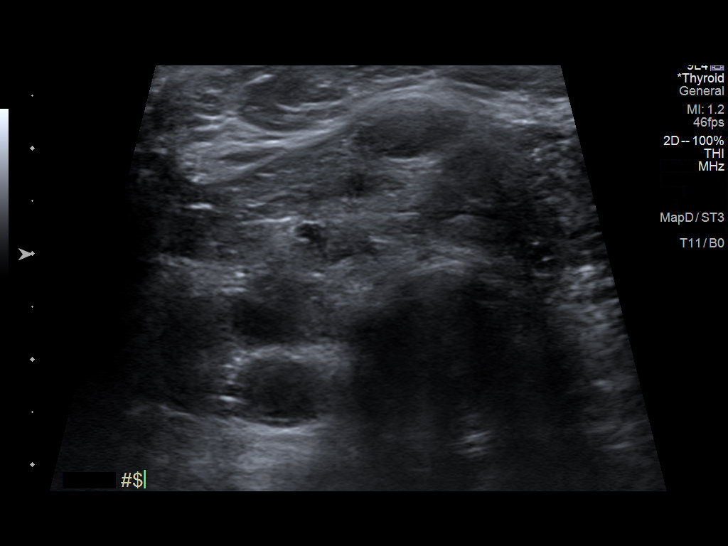
[im 10/11]
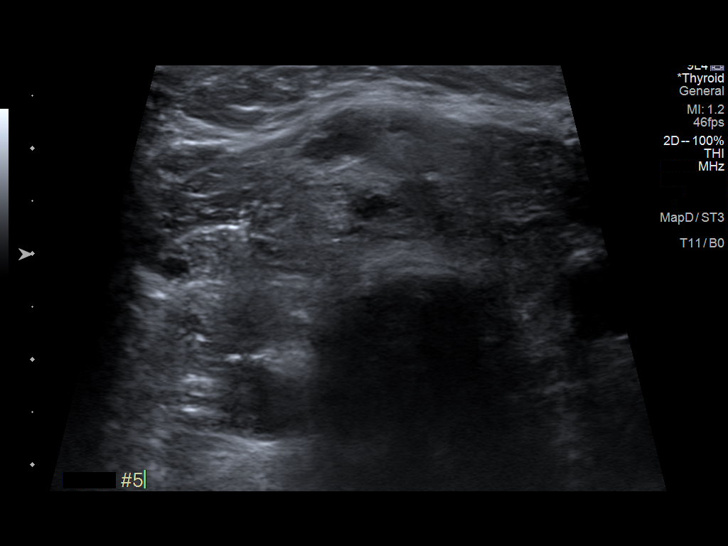
[im 11/11]
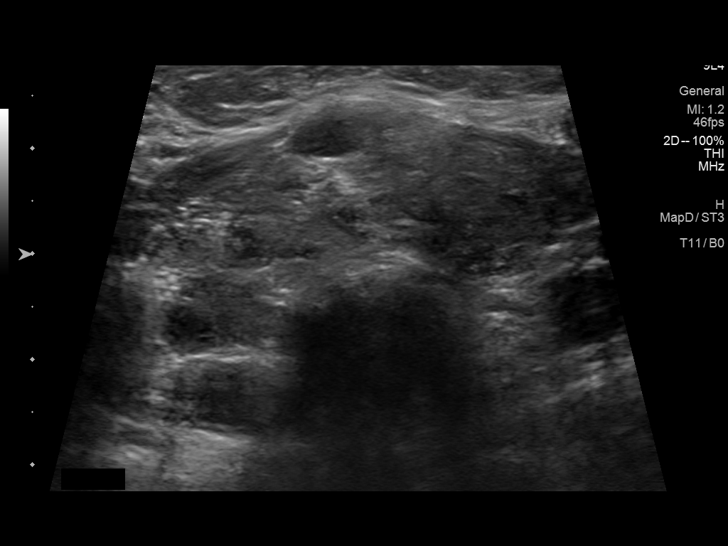

[11 of 11 positions shown; findings below may reference images not displayed]

Pre-procedural ultrasound scanning demonstrated unchanged size and
appearance of the indeterminate nodule within the isthmus.

The procedure was planned. The neck was prepped in the usual sterile
fashion, and a sterile drape was applied covering the operative
field. A timeout was performed prior to the initiation of the
procedure. Local anesthesia was provided with 1% lidocaine.

Under direct ultrasound guidance, 5 FNA biopsies were performed of
the isthmus nodule with a 27 gauge needle. Multiple ultrasound
images were saved for procedural documentation purposes. The samples
were prepared and submitted to pathology.

Limited post procedural scanning was negative for hematoma or
additional complication. Dressings were placed. The patient
tolerated the above procedures procedure well without immediate
postprocedural complication.
FINDINGS: Nodule reference number based on prior diagnostic ultrasound: 1

Maximum size: 3.2 cm

Location: Isthmus

ACR TI-RADS risk category: TR3 (3 points)

Reason for biopsy: meets ACR TI-RADS criteria

Ultrasound imaging confirms appropriate placement of the needles
within the thyroid nodule.
IMPRESSION: Technically successful ultrasound guided fine needle aspiration of
isthmus nodule. Read by: Bebie Pabna, NP

## 2024-01-18 DIAGNOSIS — H04123 Dry eye syndrome of bilateral lacrimal glands: Secondary | ICD-10-CM | POA: Diagnosis not present

## 2024-01-26 DIAGNOSIS — C9100 Acute lymphoblastic leukemia not having achieved remission: Secondary | ICD-10-CM | POA: Diagnosis not present

## 2024-01-26 DIAGNOSIS — E785 Hyperlipidemia, unspecified: Secondary | ICD-10-CM | POA: Diagnosis not present

## 2024-02-03 NOTE — Progress Notes (Unsigned)
 [facility] Return Visit  SUBJECTIVE  History of Present Illness: Brenda [name] is a 89 y.o. female seen in follow-up for mixed urinary incontinence, history of recurrent UTI, frequency bowel movements, urethral caruncle, and memory loss. Plan at last visit was discontinue oxybutynin and solifenacin, trial of Gemtesa , and topical lidocaine .   11/09/23 urine culture > 100K Klebsiella pneumoniae resistant to ampicillin and nitrofurantoin . Rx macrobid  12/18/23 called regarding UTI symptoms, dysuria and lower abdominal pain with urinary leakage. Rx Macrobid  with resolution of symptoms.  Denies resuming Trimpex  for UTI suppression.  Using Gemtesa  daily with similar relief, uses 5 pads/day.  Using estring, self removes every 3 months.  Reviewed medication list provided by patient, nitrofurantoin  100mg  nightly, oxybutynin and solifenacin all on most updated list provided by her husband.  Husband unable to verify list at bedside.   Per chart review:  - possibly H/o SPT in 2020 - UDS 04/27/19 1st sens 720, normal desire 767, 834 strong desire. VLPP 82. Voided 841, Qmax 8, PVR 30. Valsalva void. Pdet at Omax 18. No reflux - Sling in [place] 2004, cysto in 2020 - Tried trimpex , cipro  suppression in the past  Past Medical History: Patient  has a past medical history of Anxiety, Arthritis, CLL (chronic lymphocytic leukemia) (HCC), Complication of anesthesia, Depression, Depression with anxiety, Diverticulosis, Dyspnea, GERD (gastroesophageal reflux disease), Hearing loss, Memory loss, Mild cognitive impairment, Osteopenia, PONV (postoperative nausea and vomiting), Reflux, and SOB (shortness of breath).   Past Surgical History: She  has a past surgical history that includes Appendectomy; Abdominal hysterectomy; Cataract extraction (Bilateral); transthoracic echocardiogram (10/31/2010); [place] GATED MYOCARDIAL STUDY ([facility]) (01/23/2011); CARDIAC EVENT MONITOR (02/03/2005); Tonsillectomy; Eye  surgery; Knee arthroscopy (Right, 07/02/2016); and Breast excisional biopsy (Left).   Medications: She has a current medication list which includes the following prescription(s): abrysvo, anoro ellipta, atorvastatin , cyclosporine , desvenlafaxine, estradiol, lidocaine , lorazepam , multivitamin, multiple vitamins-minerals, phillips colon health, systane complete, trimethoprim , gemtesa , zetia , and zolpidem .   Allergies: Patient is allergic to hydrocodone , valium [diazepam], adhesive [tape], codeine, doxycycline, keflex [cephalexin], and sulfa antibiotics.   Social History: Patient  reports that she has never smoked. She has never used smokeless tobacco. She reports that she does not drink alcohol  and does not use drugs.     OBJECTIVE     Physical Exam: Vitals:   02/04/24 1404  BP: (!) 149/70  Pulse: 74   Gen: No apparent distress, A&O x 3.  Detailed Urogynecologic Evaluation:  Deferred.    Post Void Residual - 02/04/24 1430       Post Void Residual   Post Void Residual 74 mL            Straight Catheterization Procedure for PVR: After verbal consent was obtained from the patient for catheterization to assess bladder emptying and residual volume the urethra and surrounding tissues were prepped with betadine and an in and out catheterization was performed.  PVR was .  Urine appeared clear yellow. The patient tolerated the procedure well.       No data to display             ASSESSMENT AND PLAN    Brenda [name] is a 89 y.o. with:  1. History of recurrent UTI (urinary tract infection)   2. Urinary incontinence, mixed   3. Memory loss   4. Urethral caruncle   5. History of pelvic surgery     History of recurrent UTI (urinary tract infection) Assessment & Plan: - denies UTI symptoms today - urine  culture 09/28/23 >100K E. Coli resistant to ampicillin, intermediate to levaquin. UA +leuk/protein/heme,  > 30 RBCs - 11/09/23 urine culture > 100K Klebsiella pneumoniae  resistant to ampicillin and nitrofurantoin . Rx macrobid  - patient reports resolution of symptoms with Macrobid  - prior macrodantin  daily for suppression, patient discontinued and denies starting Trimpex  - denies stones, pyelonephritis  - For treatment of recurrent urinary tract infections, we discussed management of recurrent UTIs including prophylaxis with a daily low dose antibiotic, transvaginal estrogen therapy.  We discussed the role of diagnostic testing such as cystoscopy and upper tract imaging.   - discussed possible repeat cystoscopy and renal imaging if no recent testing available for review - continue estring, pt discontinued topical vaginal estrogen cream - reviewed anatomy and discussed urethral caruncle with slight increase size during valsalva - encouraged bowel regiment to reduce straining - discussed NSAIDs, tylenol  for UTI symptoms. Pt to present to office for testing if she experiences symptoms. Can use pyridium PRN symptoms - pt to call call if you continue to experience persistent or worsening urinary symptoms such as fever > 100.4, nausea/vomiting, one sided back pain or blood in urine that requires urgent evaluation - discontinue if gemtesa  increases frequency of UTI, unclear if patient is currently taking oxybutynin or solifenacin. Patient to review at home and marked medications to review at home to ensure discontinuation of oxybutynin and solifenacin. Encouraged pt to bring med bottles for review at follow-up.    Urinary incontinence, mixed Assessment & Plan: - prior catheterization for 40mL, repeat at today - h/o fall with L1 compression fracture - Tried oxybutynin, solifenacin, mirabegron  50mg  in the past, Gemtesa  with relief by Dr. MacDiarmid however unclear timing of discontinuation. Resumed and reports similar urinary symptoms - anti-muscarinics contraindicated due to age and MCI, mirabegron  due to HTN. However unclear if patient continues to take oxybutynin  and solifenacin, listed on most updated med list provided by patient - We discussed the symptoms of overactive bladder (OAB), which include urinary urgency, urinary frequency, nocturia, with or without urge incontinence.  While we do not know the exact etiology of OAB, several treatment options exist. We discussed management including behavioral therapy (decreasing bladder irritants, urge suppression strategies, timed voids, bladder retraining), physical therapy, medication; for refractory cases posterior tibial nerve stimulation, sacral neuromodulation, and intravesical botulinum toxin injection.  For anticholinergic medications, we discussed the potential side effects of anticholinergics including dry eyes, dry mouth, constipation, cognitive impairment and urinary retention. For Beta-3 agonist medication, we discussed the potential side effect of elevated blood pressure which is more likely to occur in individuals with uncontrolled hypertension. - continue gemtesa   - For treatment of stress urinary incontinence,  non-surgical options include expectant management, weight loss, physical therapy, as well as a pessary.  Surgical options include a midurethral sling, Burch urethropexy, and transurethral injection of a bulking agent. - possible midurethral sling history - encouraged to consider pessary fitting, discussed risk of change in urinary or bowel symptoms, vaginal ulceration, discharge, bleeding, fistula formation. Explained that pt may require multiple sizes and types for fitting.  - consider urodynamics to r/o hypoactive bladder or outlet obstruction   Memory loss Assessment & Plan: - will not use anti-muscarinics due to age and MCI - patient to review medication bottles and discontinue oxybutynin and solifenacin.  - requires additional time for visit due to difficulty with history taking and at risk of polypharmacy.   Urethral caruncle Assessment & Plan: - reviewed anatomy - 3-5mm  polypoid lesion from posterior meatus -  recheck PVR - For symptomatic vaginal atrophy options include lubrication with a water-based lubricant, personal hygiene measures and barrier protection against wetness, and estrogen replacement in the form of vaginal cream, vaginal tablets, or a time-released vaginal ring.   - continue estring use, pt discontinued topical estrogen to urethral meatus use   History of pelvic surgery Assessment & Plan: - per chart review: - possibly H/o SPT in 2020 - UDS 04/27/19 1st sens 720, normal desire 767, 834 strong desire. VLPP 82. Voided 841, Qmax 8, PVR 30. Valsalva void. Pdet at Omax 18. No reflux - Sling in [place] 2004, cysto in 2020 - catheterized for today for PVR - no mesh palpated or visualized on exam - consider urodynamics due to prior abnormal sensation and unclear history of SPT - trial of pessary to reassess bladder emptying and urinary symptoms   Time spent: I spent 46 minutes dedicated to the care of this patient on the date of this encounter to include pre-visit review of records, face-to-face time with the patient and her husband discussing mixed urinary incontinence, history of pelvic surgery, urethral caruncle, recurrent UTI, memory loss, and post visit documentation and ordering medication/ testing.   Darlene Ehlers, MD

## 2024-02-04 ENCOUNTER — Telehealth: Payer: Self-pay

## 2024-02-04 ENCOUNTER — Encounter: Payer: Self-pay | Admitting: Obstetrics

## 2024-02-04 ENCOUNTER — Ambulatory Visit: Admitting: Obstetrics

## 2024-02-04 VITALS — BP 149/70 | HR 74

## 2024-02-04 DIAGNOSIS — N3946 Mixed incontinence: Secondary | ICD-10-CM | POA: Diagnosis not present

## 2024-02-04 DIAGNOSIS — N362 Urethral caruncle: Secondary | ICD-10-CM

## 2024-02-04 DIAGNOSIS — Z87440 Personal history of urinary (tract) infections: Secondary | ICD-10-CM

## 2024-02-04 DIAGNOSIS — Z9889 Other specified postprocedural states: Secondary | ICD-10-CM | POA: Diagnosis not present

## 2024-02-04 DIAGNOSIS — R413 Other amnesia: Secondary | ICD-10-CM | POA: Diagnosis not present

## 2024-02-04 NOTE — Assessment & Plan Note (Signed)
-   per chart review: - possibly H/o SPT in 2020 - UDS 04/27/19 1st sens 720, normal desire 767, 834 strong desire. VLPP 82. Voided 841, Qmax 8, PVR 30. Valsalva void. Pdet at Omax 18. No reflux - Sling in [place] 2004, cysto in 2020 - catheterized for today for PVR - no mesh palpated or visualized on exam - consider urodynamics due to prior abnormal sensation and unclear history of SPT - trial of pessary to reassess bladder emptying and urinary symptoms

## 2024-02-04 NOTE — Assessment & Plan Note (Signed)
-   reviewed anatomy - 3-72mm polypoid lesion from posterior meatus - recheck PVR - For symptomatic vaginal atrophy options include lubrication with a water-based lubricant, personal hygiene measures and barrier protection against wetness, and estrogen replacement in the form of vaginal cream, vaginal tablets, or a time-released vaginal ring.   - continue estring use, pt discontinued topical estrogen to urethral meatus use

## 2024-02-04 NOTE — Assessment & Plan Note (Addendum)
-   will not use anti-muscarinics due to age and MCI - patient to review medication bottles and discontinue oxybutynin and solifenacin.  - requires additional time for visit due to difficulty with history taking and at risk of polypharmacy.

## 2024-02-04 NOTE — Assessment & Plan Note (Addendum)
-   denies UTI symptoms today - urine culture 09/28/23 >100K E. Coli resistant to ampicillin, intermediate to levaquin. UA +leuk/protein/heme,  > 30 RBCs - 11/09/23 urine culture > 100K Klebsiella pneumoniae resistant to ampicillin and nitrofurantoin . Rx macrobid  - patient reports resolution of symptoms with Macrobid  - prior macrodantin  daily for suppression, patient discontinued and denies starting Trimpex  - denies stones, pyelonephritis  - For treatment of recurrent urinary tract infections, we discussed management of recurrent UTIs including prophylaxis with a daily low dose antibiotic, transvaginal estrogen therapy.  We discussed the role of diagnostic testing such as cystoscopy and upper tract imaging.   - discussed possible repeat cystoscopy and renal imaging if no recent testing available for review - continue estring, pt discontinued topical vaginal estrogen cream - reviewed anatomy and discussed urethral caruncle with slight increase size during valsalva - encouraged bowel regiment to reduce straining - discussed NSAIDs, tylenol  for UTI symptoms. Pt to present to office for testing if she experiences symptoms. Can use pyridium PRN symptoms - pt to call call if you continue to experience persistent or worsening urinary symptoms such as fever > 100.4, nausea/vomiting, one sided back pain or blood in urine that requires urgent evaluation - discontinue if gemtesa  increases frequency of UTI, unclear if patient is currently taking oxybutynin or solifenacin. Patient to review at home and marked medications to review at home to ensure discontinuation of oxybutynin and solifenacin. Encouraged pt to bring med bottles for review at follow-up.

## 2024-02-04 NOTE — Assessment & Plan Note (Signed)
-   prior catheterization for 40mL, repeat at today - h/o fall with L1 compression fracture - Tried oxybutynin, solifenacin, mirabegron  50mg  in the past, Gemtesa  with relief by Dr. Clarke Crouch however unclear timing of discontinuation. Resumed and reports similar urinary symptoms - anti-muscarinics contraindicated due to age and MCI, mirabegron  due to HTN. However unclear if patient continues to take oxybutynin and solifenacin, listed on most updated med list provided by patient - We discussed the symptoms of overactive bladder (OAB), which include urinary urgency, urinary frequency, nocturia, with or without urge incontinence.  While we do not know the exact etiology of OAB, several treatment options exist. We discussed management including behavioral therapy (decreasing bladder irritants, urge suppression strategies, timed voids, bladder retraining), physical therapy, medication; for refractory cases posterior tibial nerve stimulation, sacral neuromodulation, and intravesical botulinum toxin injection.  For anticholinergic medications, we discussed the potential side effects of anticholinergics including dry eyes, dry mouth, constipation, cognitive impairment and urinary retention. For Beta-3 agonist medication, we discussed the potential side effect of elevated blood pressure which is more likely to occur in individuals with uncontrolled hypertension. - continue gemtesa   - For treatment of stress urinary incontinence,  non-surgical options include expectant management, weight loss, physical therapy, as well as a pessary.  Surgical options include a midurethral sling, Burch urethropexy, and transurethral injection of a bulking agent. - possible midurethral sling history - encouraged to consider pessary fitting, discussed risk of change in urinary or bowel symptoms, vaginal ulceration, discharge, bleeding, fistula formation. Explained that pt may require multiple sizes and types for fitting.  -  consider urodynamics to r/o hypoactive bladder or outlet obstruction

## 2024-02-04 NOTE — Telephone Encounter (Signed)
 Patient called the office to state she's NOT taking Oxybutynin or Solifenacin. She left the office earlier today and was told to call us  back to let us  know.

## 2024-02-04 NOTE — Patient Instructions (Signed)
 Please review your medications at home and ensure that you have discontinued Oxybutynin and solifenacin  Continue Gemtesa  1 tab daily and estring use with changes every 3 months.   Consider returning for pessary fitting to assess bladder emptying.

## 2024-02-10 NOTE — Telephone Encounter (Signed)
 Called pt to review medication list.  Confirmed that she is no longer taking oxybutynin or solifenacin.  Continues Gemtesa  and reports reduction of pad use to 3 pads/day.  Denies UTI symptoms. Denies macrobid  UTI suppression.  Advised pt to call and return to the office if she experiences any change in urinary symptoms. Will resume antibiotic suppression if she develops recurrent UTI.

## 2024-02-18 DIAGNOSIS — H04129 Dry eye syndrome of unspecified lacrimal gland: Secondary | ICD-10-CM | POA: Diagnosis not present

## 2024-02-18 DIAGNOSIS — R682 Dry mouth, unspecified: Secondary | ICD-10-CM | POA: Diagnosis not present

## 2024-02-18 DIAGNOSIS — L853 Xerosis cutis: Secondary | ICD-10-CM | POA: Diagnosis not present

## 2024-02-18 DIAGNOSIS — E042 Nontoxic multinodular goiter: Secondary | ICD-10-CM | POA: Diagnosis not present

## 2024-02-25 ENCOUNTER — Ambulatory Visit: Admitting: Obstetrics and Gynecology

## 2024-03-16 ENCOUNTER — Other Ambulatory Visit: Payer: Self-pay | Admitting: Obstetrics

## 2024-03-16 DIAGNOSIS — N3946 Mixed incontinence: Secondary | ICD-10-CM

## 2024-04-04 DIAGNOSIS — H04123 Dry eye syndrome of bilateral lacrimal glands: Secondary | ICD-10-CM | POA: Diagnosis not present

## 2024-04-04 DIAGNOSIS — Z961 Presence of intraocular lens: Secondary | ICD-10-CM | POA: Diagnosis not present

## 2024-04-20 ENCOUNTER — Other Ambulatory Visit: Payer: Self-pay | Admitting: Obstetrics

## 2024-04-26 DIAGNOSIS — H0288B Meibomian gland dysfunction left eye, upper and lower eyelids: Secondary | ICD-10-CM | POA: Diagnosis not present

## 2024-04-26 DIAGNOSIS — H04123 Dry eye syndrome of bilateral lacrimal glands: Secondary | ICD-10-CM | POA: Diagnosis not present

## 2024-04-26 DIAGNOSIS — H0288A Meibomian gland dysfunction right eye, upper and lower eyelids: Secondary | ICD-10-CM | POA: Diagnosis not present

## 2024-05-22 ENCOUNTER — Other Ambulatory Visit (HOSPITAL_COMMUNITY)
Admission: RE | Admit: 2024-05-22 | Discharge: 2024-05-22 | Disposition: A | Discharge status: Home / Self Care | Source: Other Acute Inpatient Hospital | Attending: Obstetrics | Admitting: Obstetrics

## 2024-05-22 ENCOUNTER — Ambulatory Visit: Payer: Self-pay | Admitting: Obstetrics

## 2024-05-22 ENCOUNTER — Encounter: Payer: Self-pay | Admitting: Obstetrics

## 2024-05-22 ENCOUNTER — Ambulatory Visit (INDEPENDENT_AMBULATORY_CARE_PROVIDER_SITE_OTHER): Admitting: Obstetrics

## 2024-05-22 VITALS — BP 152/72 | HR 86

## 2024-05-22 DIAGNOSIS — R300 Dysuria: Secondary | ICD-10-CM

## 2024-05-22 DIAGNOSIS — N3946 Mixed incontinence: Secondary | ICD-10-CM

## 2024-05-22 DIAGNOSIS — Z87440 Personal history of urinary (tract) infections: Secondary | ICD-10-CM

## 2024-05-22 DIAGNOSIS — R82998 Other abnormal findings in urine: Secondary | ICD-10-CM | POA: Diagnosis present

## 2024-05-22 LAB — POCT URINALYSIS DIP (CLINITEK)
Bilirubin, UA: NEGATIVE
Glucose, UA: NEGATIVE mg/dL
Ketones, POC UA: NEGATIVE mg/dL
Nitrite, UA: NEGATIVE
POC PROTEIN,UA: NEGATIVE
Spec Grav, UA: 1.02 (ref 1.010–1.025)
Urobilinogen, UA: 0.2 U/dL
pH, UA: 6 (ref 5.0–8.0)

## 2024-05-22 LAB — URINALYSIS, COMPLETE (UACMP) WITH MICROSCOPIC
Bilirubin Urine: NEGATIVE
Glucose, UA: NEGATIVE mg/dL
Hgb urine dipstick: NEGATIVE
Ketones, ur: NEGATIVE mg/dL
Nitrite: NEGATIVE
Protein, ur: NEGATIVE mg/dL
Specific Gravity, Urine: 1.019 (ref 1.005–1.030)
WBC, UA: >50 WBC/hpf (ref 0–5)
pH: 5 (ref 5.0–8.0)

## 2024-05-22 MED ORDER — PHENAZOPYRIDINE HCL 200 MG PO TABS: 200.0000 mg | ORAL_TABLET | Freq: Three times a day (TID) | ORAL | 0 refills | Status: AC | PRN

## 2024-05-22 MED ORDER — NITROFURANTOIN MONOHYD MACRO 100 MG PO CAPS: 100.0000 mg | ORAL_CAPSULE | Freq: Two times a day (BID) | ORAL | 0 refills | Status: DC

## 2024-05-22 NOTE — Assessment & Plan Note (Signed)
-   reports UTI symptoms with dysuria and increased urinary leakage for 4 days without systemic symptoms - urine culture 09/28/23 >100K E. Coli resistant to ampicillin, intermediate to levaquin. UA +leuk/protein/heme,  > 30 RBCs - 11/09/23 urine culture > 100K Klebsiella pneumoniae resistant to ampicillin and nitrofurantoin . Rx macrobid  - patient reports resolution of symptoms with Macrobid  despite resistant uropathogen - prior macrodantin  daily for suppression, patient discontinued and denies starting Trimpex  - denies stones, pyelonephritis  - For treatment of recurrent urinary tract infections, we discussed management of recurrent UTIs including prophylaxis with a daily low dose antibiotic, transvaginal estrogen therapy.  We discussed the role of diagnostic testing such as cystoscopy and upper tract imaging.   - discussed possible repeat cystoscopy and renal imaging if no recent testing available for review, will postpone with joint decision making - continue self replacement of estring every 3 months, pt discontinued topical vaginal estrogen cream - reviewed anatomy and discussed urethral caruncle with slight increase size during valsalva - encouraged bowel regiment to reduce straining - discussed NSAIDs, tylenol  for UTI symptoms. Pt to present to office for testing if she experiences symptoms. Rx pyridium  PRN symptoms - pt to call call if you continue to experience persistent or worsening urinary symptoms such as fever > 100.4, nausea/vomiting, one sided back pain or blood in urine that requires urgent evaluation - discontinue if gemtesa  increases frequency of UTI, patient denies taking oxybutynin or solifenacin. Encouraged pt to bring med bottles for review at follow-up.

## 2024-05-22 NOTE — Assessment & Plan Note (Signed)
-   prior catheterization for repeat at - h/o fall with L1 compression fracture - Tried oxybutynin, solifenacin, mirabegron  50mg  in the past, Gemtesa  with relief by Dr. Gaston however unclear timing of discontinuation.  - continues Gemtesa  with similar symptoms - anti-muscarinics contraindicated due to age and MCI, mirabegron  due to HTN. Patient denies taking oxybutynin and solifenacin, listed on most updated med list provided by patient - We discussed the symptoms of overactive bladder (OAB), which include urinary urgency, urinary frequency, nocturia, with or without urge incontinence.  While we do not know the exact etiology of OAB, several treatment options exist. We discussed management including behavioral therapy (decreasing bladder irritants, urge suppression strategies, timed voids, bladder retraining), physical therapy, medication; for refractory cases posterior tibial nerve stimulation, sacral neuromodulation, and intravesical botulinum toxin injection.  For anticholinergic medications, we discussed the potential side effects of anticholinergics including dry eyes, dry mouth, constipation, cognitive impairment and urinary retention. For Beta-3 agonist medication, we discussed the potential side effect of elevated blood pressure which is more likely to occur in individuals with uncontrolled hypertension. - continue gemtesa   - For treatment of stress urinary incontinence,  non-surgical options include expectant management, weight loss, physical therapy, as well as a pessary.  Surgical options include a midurethral sling, Burch urethropexy, and transurethral injection of a bulking agent. - possible midurethral sling history - encouraged to consider pessary fitting, discussed risk of change in urinary or bowel symptoms, vaginal ulceration, discharge, bleeding, fistula formation. Explained that pt may require multiple sizes and types for fitting.  - consider urodynamics to r/o  hypoactive bladder or outlet obstruction, will postpone at this time with joint decision making

## 2024-05-22 NOTE — Progress Notes (Deleted)
 Took macrobid  Friday due to large volume leakage and burning with urination. Continues gemtesa  daily.  Takes 2 tylenols/day for back pain at baseline.   Continues to have urinary leakage managed by  Baseline 4 pads/day, increased amount of leakage since Friday  Denies blood in urine, fever, N/V, incomplete emptying.  Denies change from baseline back pain  Esting self removed and replaced a few weeks ago, reports symptoms started after estring replacement.

## 2024-05-22 NOTE — Progress Notes (Signed)
 [facility] Return Visit  SUBJECTIVE  History of Present Illness: BRITINY DEFRAIN is a 89 y.o. female seen in follow-up for mixed urinary incontinence, history of recurrent UTI, frequency bowel movements, urethral caruncle, and memory loss. Plan at last visit was continue Gemtesa , topical lidocaine , estring use, consider bladder testing and pessary trial.   Took macrobid  Friday due to 1 episode of large volume leakage and burning with urination. Continues gemtesa  daily. Baseline 4 pads/day, increased amount of leakage since Friday. Previously reported 5 pads/day Takes 2 tylenols/day for back pain at baseline and denies change from baseline back pain  Denies blood in urine, fever, N/V, incomplete emptying.  Denies change from baseline back pain   Esting self removed and replaced a few weeks ago, reports symptoms started after estring self replacement.  Pt denies starting Trimpex  Last treated for UTI in 10/2023  11/09/23 urine culture > 100K Klebsiella pneumoniae resistant to ampicillin and nitrofurantoin . Rx macrobid  12/18/23 called regarding UTI symptoms, dysuria and lower abdominal pain with urinary leakage. Rx Macrobid  with resolution of symptoms.  Denies resuming Trimpex  for UTI suppression.  Using estring, self removes every 3 months.  Per chart review:  - possibly H/o SPT in 2020 - UDS 04/27/19 1st sens 720, normal desire 767, 834 strong desire. VLPP 82. Voided 841, Qmax 8, PVR 30. Valsalva void. Pdet at Omax 18. No reflux - Sling in [place] 2004, cysto in 2020 - Tried trimpex , cipro  suppression in the past  Past Medical History: Patient  has a past medical history of Anxiety, Arthritis, CLL (chronic lymphocytic leukemia) (HCC), Complication of anesthesia, Depression, Depression with anxiety, Diverticulosis, Dyspnea, GERD (gastroesophageal reflux disease), Hearing loss, Memory loss, Mild cognitive impairment, Osteopenia, PONV (postoperative nausea and vomiting), Reflux,  and SOB (shortness of breath).   Past Surgical History: She  has a past surgical history that includes Appendectomy; Abdominal hysterectomy; Cataract extraction (Bilateral); transthoracic echocardiogram (10/31/2010); NM GATED MYOCARDIAL STUDY ([facility] HX) (01/23/2011); CARDIAC EVENT MONITOR (02/03/2005); Tonsillectomy; Eye surgery; Knee arthroscopy (Right, 07/02/2016); and Breast excisional biopsy (Left).   Medications: She has a current medication list which includes the following prescription(s): abrysvo, anoro ellipta, atorvastatin , cyclosporine , desvenlafaxine, estradiol, gemtesa , lidocaine , lorazepam , multivitamin, multiple vitamins-minerals, nitrofurantoin  (macrocrystal-monohydrate), phenazopyridine , phillips colon health, systane complete, trimethoprim , zetia , and zolpidem .   Allergies: Patient is allergic to hydrocodone , valium [diazepam], adhesive [tape], codeine, doxycycline, keflex [cephalexin], and sulfa antibiotics.   Social History: Patient  reports that she has never smoked. She has never used smokeless tobacco. She reports that she does not drink alcohol  and does not use drugs.     OBJECTIVE     Physical Exam: Vitals:   05/22/24 1546  BP: (!) 152/72  Pulse: 86    Gen: No apparent distress, A&O x 3.  Straight Catheterization Procedure for PVR: After verbal consent was obtained from the patient for catheterization to assess bladder emptying and residual volume the urethra and surrounding tissues were prepped with betadine and an in and out catheterization was performed.  PVR was 50mL.  Urine appeared dark yellow. The patient tolerated the procedure well.  Lab Results  Component Value Date   COLORU yellow 05/22/2024   CLARITYU hazy (A) 05/22/2024   GLUCOSEUR negative 05/22/2024   BILIRUBINUR negative 05/22/2024   KETONESU trace 11/05/2023   SPECGRAV 1.020 05/22/2024   RBCUR small (A) 05/22/2024   PHUR 6.0 05/22/2024   PROTEINUR Negative 11/05/2023   UROBILINOGEN 0.2  05/22/2024   LEUKOCYTESUR Moderate (2+) (A) 05/22/2024  ASSESSMENT AND PLAN    Ms. Soohoo is a 89 y.o. with:  1. History of recurrent UTI (urinary tract infection)   2. Dysuria   3. Urinary incontinence, mixed      History of recurrent UTI (urinary tract infection) Assessment & Plan: - reports UTI symptoms with dysuria and increased urinary leakage for 4 days without systemic symptoms - urine culture 09/28/23 >100K E. Coli resistant to ampicillin, intermediate to levaquin. UA +leuk/protein/heme,  > 30 RBCs - 11/09/23 urine culture > 100K Klebsiella pneumoniae resistant to ampicillin and nitrofurantoin . Rx macrobid  - patient reports resolution of symptoms with Macrobid  despite resistant uropathogen - prior macrodantin  daily for suppression, patient discontinued and denies starting Trimpex  - denies stones, pyelonephritis  - For treatment of recurrent urinary tract infections, we discussed management of recurrent UTIs including prophylaxis with a daily low dose antibiotic, transvaginal estrogen therapy.  We discussed the role of diagnostic testing such as cystoscopy and upper tract imaging.   - discussed possible repeat cystoscopy and renal imaging if no recent testing available for review, will postpone with joint decision making - continue self replacement of estring every 3 months, pt discontinued topical vaginal estrogen cream - reviewed anatomy and discussed urethral caruncle with slight increase size during valsalva - encouraged bowel regiment to reduce straining - discussed NSAIDs, tylenol  for UTI symptoms. Pt to present to office for testing if she experiences symptoms. Rx pyridium  PRN symptoms - pt to call call if you continue to experience persistent or worsening urinary symptoms such as fever > 100.4, nausea/vomiting, one sided back pain or blood in urine that requires urgent evaluation - discontinue if gemtesa  increases frequency of UTI, patient denies taking oxybutynin or  solifenacin. Encouraged pt to bring med bottles for review at follow-up.   Orders: -     Phenazopyridine  HCl; Take 1 tablet (200 mg total) by mouth 3 (three) times daily as needed for pain.  Dispense: 10 tablet; Refill: 0 -     Nitrofurantoin  Monohyd Macro; Take 1 capsule (100 mg total) by mouth 2 (two) times daily for 5 days.  Dispense: 10 capsule; Refill: 0 -     POCT URINALYSIS DIP (CLINITEK)  Dysuria Assessment & Plan: - POCT UA + leuk/heme from catheterized sample, PVR 50mL - pending catheterized culture - encouraged pyridium  use PRN symptoms - encouraged NSAIDs, Tylenol  PRN discomfort - encouraged to dilute urine with warm water during voids for discomfort - continue vaginal estrogen with estring - Rx macrobid  for presumed UTI  Orders: -     Nitrofurantoin  Monohyd Macro; Take 1 capsule (100 mg total) by mouth 2 (two) times daily for 5 days.  Dispense: 10 capsule; Refill: 0 -     Urine Microscopic; Future -     Urine Culture; Future  Urinary incontinence, mixed Assessment & Plan: - prior catheterization for repeat at - h/o fall with L1 compression fracture - Tried oxybutynin, solifenacin, mirabegron  50mg  in the past, Gemtesa  with relief by Dr. MacDiarmid however unclear timing of discontinuation.  - continues Gemtesa  with similar symptoms - anti-muscarinics contraindicated due to age and MCI, mirabegron  due to HTN. Patient denies taking oxybutynin and solifenacin, listed on most updated med list provided by patient - We discussed the symptoms of overactive bladder (OAB), which include urinary urgency, urinary frequency, nocturia, with or without urge incontinence.  While we do not know the exact etiology of OAB, several treatment options exist. We discussed management including behavioral therapy (decreasing bladder irritants, urge suppression strategies, timed  voids, bladder retraining), physical therapy, medication; for refractory cases posterior tibial nerve  stimulation, sacral neuromodulation, and intravesical botulinum toxin injection.  For anticholinergic medications, we discussed the potential side effects of anticholinergics including dry eyes, dry mouth, constipation, cognitive impairment and urinary retention. For Beta-3 agonist medication, we discussed the potential side effect of elevated blood pressure which is more likely to occur in individuals with uncontrolled hypertension. - continue gemtesa   - For treatment of stress urinary incontinence,  non-surgical options include expectant management, weight loss, physical therapy, as well as a pessary.  Surgical options include a midurethral sling, Burch urethropexy, and transurethral injection of a bulking agent. - possible midurethral sling history - encouraged to consider pessary fitting, discussed risk of change in urinary or bowel symptoms, vaginal ulceration, discharge, bleeding, fistula formation. Explained that pt may require multiple sizes and types for fitting.  - consider urodynamics to r/o hypoactive bladder or outlet obstruction, will postpone at this time with joint decision making    Lianne ONEIDA Gillis, MD

## 2024-05-22 NOTE — Patient Instructions (Addendum)
 Start macrobid  100mg  by mouth twice daily for presumed UTI.   Continue vaginal estrogen use with estring every 3 months.   Please call if you continue to experience persistent or worsening urinary symptoms such as fever > 100.4, nausea/vomiting, one sided back pain or blood in your urine.   Continue Gemtesa .   Start pyridium  200mg  by mouth with food up to 3 times a day for UTI symptoms.  You can purchase Azo over the counter if it is not covered by insurance.

## 2024-05-22 NOTE — Assessment & Plan Note (Addendum)
-   POCT UA + leuk/heme from catheterized sample, PVR 50mL - pending catheterized culture - encouraged pyridium  use PRN symptoms - encouraged NSAIDs, Tylenol  PRN discomfort - encouraged to dilute urine with warm water during voids for discomfort - continue vaginal estrogen with estring - Rx macrobid  for presumed UTI

## 2024-05-24 ENCOUNTER — Telehealth: Payer: Self-pay

## 2024-05-24 LAB — URINE CULTURE: Culture: >=100000 — AB

## 2024-05-24 MED ORDER — CIPROFLOXACIN HCL 500 MG PO TABS: 500.0000 mg | ORAL_TABLET | Freq: Two times a day (BID) | ORAL | 0 refills | Status: AC

## 2024-05-24 NOTE — Telephone Encounter (Signed)
 Patient stated she may have failed to mention, she's allergic to Cipro . She is requesting an different antibiotic sent to her pharmacy

## 2024-06-06 DIAGNOSIS — Z85828 Personal history of other malignant neoplasm of skin: Secondary | ICD-10-CM | POA: Diagnosis not present

## 2024-06-06 DIAGNOSIS — L570 Actinic keratosis: Secondary | ICD-10-CM | POA: Diagnosis not present

## 2024-06-06 DIAGNOSIS — L821 Other seborrheic keratosis: Secondary | ICD-10-CM | POA: Diagnosis not present

## 2024-06-15 DIAGNOSIS — Z23 Encounter for immunization: Secondary | ICD-10-CM | POA: Diagnosis not present

## 2024-06-17 ENCOUNTER — Other Ambulatory Visit: Payer: Self-pay | Admitting: Obstetrics

## 2024-06-17 DIAGNOSIS — N3946 Mixed incontinence: Secondary | ICD-10-CM

## 2024-06-28 DIAGNOSIS — Z85828 Personal history of other malignant neoplasm of skin: Secondary | ICD-10-CM | POA: Diagnosis not present

## 2024-06-28 DIAGNOSIS — H61001 Unspecified perichondritis of right external ear: Secondary | ICD-10-CM | POA: Diagnosis not present

## 2024-06-28 DIAGNOSIS — S80861A Insect bite (nonvenomous), right lower leg, initial encounter: Secondary | ICD-10-CM | POA: Diagnosis not present

## 2024-07-04 DIAGNOSIS — Z23 Encounter for immunization: Secondary | ICD-10-CM | POA: Diagnosis not present

## 2024-07-11 DIAGNOSIS — M810 Age-related osteoporosis without current pathological fracture: Secondary | ICD-10-CM | POA: Diagnosis not present

## 2024-07-11 DIAGNOSIS — R7301 Impaired fasting glucose: Secondary | ICD-10-CM | POA: Diagnosis not present

## 2024-07-11 DIAGNOSIS — E041 Nontoxic single thyroid nodule: Secondary | ICD-10-CM | POA: Diagnosis not present

## 2024-07-11 DIAGNOSIS — K5900 Constipation, unspecified: Secondary | ICD-10-CM | POA: Diagnosis not present

## 2024-07-11 DIAGNOSIS — E785 Hyperlipidemia, unspecified: Secondary | ICD-10-CM | POA: Diagnosis not present

## 2024-07-18 DIAGNOSIS — R82998 Other abnormal findings in urine: Secondary | ICD-10-CM | POA: Diagnosis not present

## 2024-08-02 DIAGNOSIS — H0288B Meibomian gland dysfunction left eye, upper and lower eyelids: Secondary | ICD-10-CM | POA: Diagnosis not present

## 2024-08-02 DIAGNOSIS — H0288A Meibomian gland dysfunction right eye, upper and lower eyelids: Secondary | ICD-10-CM | POA: Diagnosis not present

## 2024-08-22 ENCOUNTER — Ambulatory Visit (INDEPENDENT_AMBULATORY_CARE_PROVIDER_SITE_OTHER): Admitting: Obstetrics and Gynecology

## 2024-08-22 VITALS — BP 144/80 | HR 97

## 2024-08-22 DIAGNOSIS — N3946 Mixed incontinence: Secondary | ICD-10-CM | POA: Diagnosis not present

## 2024-08-22 NOTE — Progress Notes (Signed)
 [facility] Return Visit  SUBJECTIVE  History of Present Illness: Brenda [name] is a 89 y.o. female seen in follow-up for OAB/SUI. Plan at last visit was tria of Gemtesa  to see if this makes her leakage better.  She reports she is not having much support with the Gemtesa . She has mixed incontinence and cannot decipher which she has more issue with. She also is unsure if she has a sling.   Past Medical History: Patient  has a past medical history of Anxiety, Arthritis, CLL (chronic lymphocytic leukemia) (HCC), Complication of anesthesia, Depression, Depression with anxiety, Diverticulosis, Dyspnea, GERD (gastroesophageal reflux disease), Hearing loss, Memory loss, Mild cognitive impairment, Osteopenia, PONV (postoperative nausea and vomiting), Reflux, and SOB (shortness of breath).   Past Surgical History: She  has a past surgical history that includes Appendectomy; Abdominal hysterectomy; Cataract extraction (Bilateral); transthoracic echocardiogram (10/31/2010); NM GATED MYOCARDIAL STUDY ([facility] HX) (01/23/2011); CARDIAC EVENT MONITOR (02/03/2005); Tonsillectomy; Eye surgery; Knee arthroscopy (Right, 07/02/2016); and Breast excisional biopsy (Left).   Medications: She has a current medication list which includes the following prescription(s): abrysvo, anoro ellipta, atorvastatin , cyclosporine , desvenlafaxine, estradiol, gemtesa , lidocaine , lorazepam , multivitamin, multiple vitamins-minerals, phenazopyridine , phillips colon health, systane complete, zetia , and zolpidem .   Allergies: Patient is allergic to hydrocodone , valium [diazepam], adhesive [tape], codeine, doxycycline, keflex [cephalexin], and sulfa antibiotics.   Social History: Patient  reports that she has never smoked. She has never used smokeless tobacco. She reports that she does not drink alcohol  and does not use drugs.     OBJECTIVE     Physical Exam: Vitals:   08/22/24 1058  BP: (!) 144/80  Pulse: 97    Gen: No apparent distress, A&O x 3.  Detailed Urogynecologic Evaluation:  Deferred.   ASSESSMENT AND PLAN    Brenda [name] is a 89 y.o. with:  1. Urinary incontinence, mixed    Patient is not able to differentiate well between her leakage. I would like to do UDS on patient to determine which leakage seems most bothersome. If she is a good candidate and SUI seems most bothersome, she could undergo bulking and possibly have a large amount of her symptoms dissipate.   Patient to return for UDS.    Amun Stemm G Shaquina Gillham, NP

## 2024-09-27 ENCOUNTER — Encounter: Admitting: Obstetrics and Gynecology

## 2024-10-04 ENCOUNTER — Other Ambulatory Visit: Payer: Self-pay

## 2024-10-04 DIAGNOSIS — N3946 Mixed incontinence: Secondary | ICD-10-CM

## 2024-10-04 MED ORDER — GEMTESA 75 MG PO TABS: 75.0000 mg | ORAL_TABLET | Freq: Every day | ORAL | 2 refills | Status: AC

## 2024-10-11 ENCOUNTER — Ambulatory Visit: Admitting: Obstetrics

## 2024-10-11 ENCOUNTER — Encounter: Admitting: Obstetrics and Gynecology

## 2024-12-04 ENCOUNTER — Ambulatory Visit: Admitting: Internal Medicine
